# Patient Record
Sex: Female | Born: 1970 | Race: Black or African American | Hispanic: No | Smoking: Former smoker
Health system: Southern US, Community
[De-identification: ages and names within clinical notes are randomized; demographics above are authoritative.]

## PROBLEM LIST (undated history)

## (undated) DIAGNOSIS — R0683 Snoring: Secondary | ICD-10-CM

## (undated) DIAGNOSIS — M1990 Unspecified osteoarthritis, unspecified site: Secondary | ICD-10-CM

## (undated) DIAGNOSIS — N809 Endometriosis, unspecified: Secondary | ICD-10-CM

## (undated) DIAGNOSIS — E785 Hyperlipidemia, unspecified: Secondary | ICD-10-CM

## (undated) DIAGNOSIS — R739 Hyperglycemia, unspecified: Secondary | ICD-10-CM

## (undated) DIAGNOSIS — I2510 Atherosclerotic heart disease of native coronary artery without angina pectoris: Secondary | ICD-10-CM

## (undated) DIAGNOSIS — Z5189 Encounter for other specified aftercare: Secondary | ICD-10-CM

## (undated) DIAGNOSIS — K219 Gastro-esophageal reflux disease without esophagitis: Secondary | ICD-10-CM

## (undated) DIAGNOSIS — Z8619 Personal history of other infectious and parasitic diseases: Secondary | ICD-10-CM

## (undated) DIAGNOSIS — B009 Herpesviral infection, unspecified: Secondary | ICD-10-CM

## (undated) DIAGNOSIS — G4700 Insomnia, unspecified: Secondary | ICD-10-CM

## (undated) DIAGNOSIS — F329 Major depressive disorder, single episode, unspecified: Secondary | ICD-10-CM

## (undated) DIAGNOSIS — I10 Essential (primary) hypertension: Secondary | ICD-10-CM

## (undated) DIAGNOSIS — J3503 Chronic tonsillitis and adenoiditis: Secondary | ICD-10-CM

## (undated) DIAGNOSIS — F32A Depression, unspecified: Secondary | ICD-10-CM

## (undated) DIAGNOSIS — E119 Type 2 diabetes mellitus without complications: Secondary | ICD-10-CM

## (undated) DIAGNOSIS — I255 Ischemic cardiomyopathy: Secondary | ICD-10-CM

## (undated) HISTORY — DX: Personal history of other infectious and parasitic diseases: Z86.19

## (undated) HISTORY — PX: ABDOMINAL HYSTERECTOMY: SHX81

## (undated) HISTORY — DX: Type 2 diabetes mellitus without complications: E11.9

## (undated) HISTORY — DX: Gastro-esophageal reflux disease without esophagitis: K21.9

## (undated) HISTORY — PX: BREAST CYST EXCISION: SHX579

## (undated) HISTORY — PX: ENDOMETRIAL BIOPSY: SHX622

## (undated) HISTORY — DX: Essential (primary) hypertension: I10

## (undated) HISTORY — PX: LAPAROSCOPIC ENDOMETRIOSIS FULGURATION: SUR769

## (undated) HISTORY — DX: Insomnia, unspecified: G47.00

## (undated) HISTORY — DX: Snoring: R06.83

## (undated) HISTORY — DX: Unspecified osteoarthritis, unspecified site: M19.90

## (undated) HISTORY — DX: Chronic tonsillitis and adenoiditis: J35.03

## (undated) HISTORY — PX: COMBINED ABDOMINOPLASTY AND LIPOSUCTION: SUR284

## (undated) HISTORY — DX: Hyperlipidemia, unspecified: E78.5

## (undated) HISTORY — DX: Herpesviral infection, unspecified: B00.9

## (undated) HISTORY — DX: Encounter for other specified aftercare: Z51.89

## (undated) HISTORY — DX: Depression, unspecified: F32.A

## (undated) HISTORY — DX: Endometriosis, unspecified: N80.9

## (undated) HISTORY — PX: ENDOMETRIAL ABLATION: SHX621

## (undated) HISTORY — PX: TUBAL LIGATION: SHX77

## (undated) HISTORY — PX: PARTIAL HYSTERECTOMY: SHX80

---
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
## 2018-01-07 DIAGNOSIS — E119 Type 2 diabetes mellitus without complications: Secondary | ICD-10-CM | POA: Diagnosis not present

## 2018-01-07 DIAGNOSIS — E782 Mixed hyperlipidemia: Secondary | ICD-10-CM | POA: Diagnosis not present

## 2018-01-07 DIAGNOSIS — L509 Urticaria, unspecified: Secondary | ICD-10-CM | POA: Diagnosis not present

## 2018-01-07 DIAGNOSIS — I10 Essential (primary) hypertension: Secondary | ICD-10-CM | POA: Diagnosis not present

## 2018-01-11 DIAGNOSIS — E559 Vitamin D deficiency, unspecified: Secondary | ICD-10-CM | POA: Diagnosis not present

## 2018-01-11 DIAGNOSIS — E782 Mixed hyperlipidemia: Secondary | ICD-10-CM | POA: Diagnosis not present

## 2018-01-11 DIAGNOSIS — E1149 Type 2 diabetes mellitus with other diabetic neurological complication: Secondary | ICD-10-CM | POA: Diagnosis not present

## 2018-01-11 DIAGNOSIS — E669 Obesity, unspecified: Secondary | ICD-10-CM | POA: Diagnosis not present

## 2018-03-04 DIAGNOSIS — Z1231 Encounter for screening mammogram for malignant neoplasm of breast: Secondary | ICD-10-CM | POA: Diagnosis not present

## 2018-03-20 DIAGNOSIS — Z885 Allergy status to narcotic agent status: Secondary | ICD-10-CM | POA: Diagnosis not present

## 2018-03-20 DIAGNOSIS — E785 Hyperlipidemia, unspecified: Secondary | ICD-10-CM | POA: Diagnosis not present

## 2018-03-20 DIAGNOSIS — N762 Acute vulvitis: Secondary | ICD-10-CM | POA: Diagnosis not present

## 2018-03-20 DIAGNOSIS — I10 Essential (primary) hypertension: Secondary | ICD-10-CM | POA: Diagnosis not present

## 2018-03-20 DIAGNOSIS — Z888 Allergy status to other drugs, medicaments and biological substances status: Secondary | ICD-10-CM | POA: Diagnosis not present

## 2018-03-20 DIAGNOSIS — E119 Type 2 diabetes mellitus without complications: Secondary | ICD-10-CM | POA: Diagnosis not present

## 2018-03-20 DIAGNOSIS — Z886 Allergy status to analgesic agent status: Secondary | ICD-10-CM | POA: Diagnosis not present

## 2018-03-20 DIAGNOSIS — Z87891 Personal history of nicotine dependence: Secondary | ICD-10-CM | POA: Diagnosis not present

## 2018-03-20 DIAGNOSIS — Z881 Allergy status to other antibiotic agents status: Secondary | ICD-10-CM | POA: Diagnosis not present

## 2018-03-20 DIAGNOSIS — Z880 Allergy status to penicillin: Secondary | ICD-10-CM | POA: Diagnosis not present

## 2018-03-20 DIAGNOSIS — L03315 Cellulitis of perineum: Secondary | ICD-10-CM | POA: Diagnosis not present

## 2018-06-11 DIAGNOSIS — E1149 Type 2 diabetes mellitus with other diabetic neurological complication: Secondary | ICD-10-CM | POA: Diagnosis not present

## 2018-06-11 DIAGNOSIS — Z7251 High risk heterosexual behavior: Secondary | ICD-10-CM | POA: Diagnosis not present

## 2018-06-11 DIAGNOSIS — I10 Essential (primary) hypertension: Secondary | ICD-10-CM | POA: Diagnosis not present

## 2018-06-11 DIAGNOSIS — N760 Acute vaginitis: Secondary | ICD-10-CM | POA: Diagnosis not present

## 2018-06-11 DIAGNOSIS — E782 Mixed hyperlipidemia: Secondary | ICD-10-CM | POA: Diagnosis not present

## 2018-06-11 DIAGNOSIS — Z202 Contact with and (suspected) exposure to infections with a predominantly sexual mode of transmission: Secondary | ICD-10-CM | POA: Diagnosis not present

## 2018-06-11 DIAGNOSIS — Z113 Encounter for screening for infections with a predominantly sexual mode of transmission: Secondary | ICD-10-CM | POA: Diagnosis not present

## 2018-06-11 DIAGNOSIS — E119 Type 2 diabetes mellitus without complications: Secondary | ICD-10-CM | POA: Diagnosis not present

## 2018-09-28 DIAGNOSIS — Z23 Encounter for immunization: Secondary | ICD-10-CM | POA: Diagnosis not present

## 2018-12-15 DIAGNOSIS — N390 Urinary tract infection, site not specified: Secondary | ICD-10-CM | POA: Diagnosis not present

## 2018-12-15 DIAGNOSIS — Z113 Encounter for screening for infections with a predominantly sexual mode of transmission: Secondary | ICD-10-CM | POA: Diagnosis not present

## 2019-06-21 ENCOUNTER — Telehealth: Payer: Self-pay | Admitting: Family Medicine

## 2019-06-21 NOTE — Telephone Encounter (Signed)
Wanting to establish care with Dr. Charlett Blake. Please advise.

## 2019-07-08 NOTE — Telephone Encounter (Signed)
Ok to set up new patient

## 2019-07-18 ENCOUNTER — Ambulatory Visit (INDEPENDENT_AMBULATORY_CARE_PROVIDER_SITE_OTHER): Payer: BC Managed Care – PPO | Admitting: Family Medicine

## 2019-07-18 ENCOUNTER — Telehealth: Payer: Self-pay | Admitting: *Deleted

## 2019-07-18 ENCOUNTER — Other Ambulatory Visit: Payer: Self-pay

## 2019-07-18 ENCOUNTER — Encounter: Payer: Self-pay | Admitting: Family Medicine

## 2019-07-18 DIAGNOSIS — E119 Type 2 diabetes mellitus without complications: Secondary | ICD-10-CM | POA: Diagnosis not present

## 2019-07-18 DIAGNOSIS — G4700 Insomnia, unspecified: Secondary | ICD-10-CM

## 2019-07-18 DIAGNOSIS — F32A Depression, unspecified: Secondary | ICD-10-CM | POA: Insufficient documentation

## 2019-07-18 DIAGNOSIS — E785 Hyperlipidemia, unspecified: Secondary | ICD-10-CM

## 2019-07-18 DIAGNOSIS — Z8619 Personal history of other infectious and parasitic diseases: Secondary | ICD-10-CM

## 2019-07-18 DIAGNOSIS — R0683 Snoring: Secondary | ICD-10-CM | POA: Diagnosis not present

## 2019-07-18 DIAGNOSIS — Z8719 Personal history of other diseases of the digestive system: Secondary | ICD-10-CM

## 2019-07-18 DIAGNOSIS — B009 Herpesviral infection, unspecified: Secondary | ICD-10-CM

## 2019-07-18 DIAGNOSIS — I10 Essential (primary) hypertension: Secondary | ICD-10-CM

## 2019-07-18 DIAGNOSIS — F329 Major depressive disorder, single episode, unspecified: Secondary | ICD-10-CM

## 2019-07-18 DIAGNOSIS — N809 Endometriosis, unspecified: Secondary | ICD-10-CM

## 2019-07-18 DIAGNOSIS — F418 Other specified anxiety disorders: Secondary | ICD-10-CM | POA: Insufficient documentation

## 2019-07-18 DIAGNOSIS — R739 Hyperglycemia, unspecified: Secondary | ICD-10-CM

## 2019-07-18 MED ORDER — VALACYCLOVIR HCL 500 MG PO TABS: 500.0000 mg | ORAL_TABLET | Freq: Two times a day (BID) | ORAL | 1 refills | Status: DC

## 2019-07-18 MED ORDER — LOSARTAN POTASSIUM 50 MG PO TABS: 50.0000 mg | ORAL_TABLET | Freq: Every day | ORAL | 1 refills | Status: DC

## 2019-07-18 MED ORDER — ATORVASTATIN CALCIUM 40 MG PO TABS: 40.0000 mg | ORAL_TABLET | Freq: Every day | ORAL | 1 refills | Status: DC

## 2019-07-18 NOTE — Telephone Encounter (Signed)
Patient needs lab appointment Thursday or Friday this week, if no appt can do next week per Dr. Charlett Blake

## 2019-07-24 ENCOUNTER — Encounter: Payer: Self-pay | Admitting: Family Medicine

## 2019-07-24 DIAGNOSIS — E785 Hyperlipidemia, unspecified: Secondary | ICD-10-CM | POA: Insufficient documentation

## 2019-07-24 DIAGNOSIS — E109 Type 1 diabetes mellitus without complications: Secondary | ICD-10-CM | POA: Insufficient documentation

## 2019-07-24 DIAGNOSIS — N809 Endometriosis, unspecified: Secondary | ICD-10-CM | POA: Insufficient documentation

## 2019-07-24 DIAGNOSIS — E119 Type 2 diabetes mellitus without complications: Secondary | ICD-10-CM | POA: Insufficient documentation

## 2019-07-24 DIAGNOSIS — B009 Herpesviral infection, unspecified: Secondary | ICD-10-CM | POA: Insufficient documentation

## 2019-07-24 DIAGNOSIS — G4700 Insomnia, unspecified: Secondary | ICD-10-CM | POA: Insufficient documentation

## 2019-07-24 DIAGNOSIS — R739 Hyperglycemia, unspecified: Secondary | ICD-10-CM

## 2019-07-24 HISTORY — DX: Hyperglycemia, unspecified: R73.9

## 2019-07-24 NOTE — Progress Notes (Addendum)
Virtual Visit via Video Note  I connected with Bonnie Dickson on 07/18/19 at  3:00 PM EDT by a video enabled telemedicine application and verified that I am speaking with the correct person using two identifiers.  Location: Patient: home Provider: office   I discussed the limitations of evaluation and management by telemedicine and the availability of in person appointments. The patient expressed understanding and agreed to proceed. Magdalene Molly, CMA was able to get the patient set up on video visit   Subjective:    Patient ID: Bonnie Dickson, female    DOB: 1971-08-15, 48 y.o.   MRN: [id number]  Chief Complaint  Patient presents with  . Establish Care  . Hypertension  . Hyperlipidemia  . hsv 2  . Diabetes    HPI Patient is in today for new patient appointment to establish care to help manage her chronic medical concerns including hypertension, hyperlipidemia, HSV and diabetes. No recent febrile illness or hospitalizations. Is working as a Marine scientist but otherwise Psychologist, sport and exercise. Denies CP/palp/SOB/HA/congestion/fevers/GI or GU c/o. Taking meds as prescribed. She is maintaining a heart healthy diet and stays active. Her HSV is well controlled on valtrex and she is requesting a refill. She is on Atorvastatin at 40 mg daily and is tolerating this course of treatment. Her diabetes is diet controlled and does not endorse polyuria or polydipsia. She is trying to maintain a diabetic diet. She reports her blood pressure has been well controlled and she has no concerning symptoms such as headache or chest pain.   Past Medical History:  Diagnosis Date  . Depression    situational  . Diabetes mellitus without complication ([place])    diet controlled  . Endometriosis   . History of chicken pox   . HSV-2 (herpes simplex virus 2) infection   . Hyperglycemia 07/24/2019  . Hyperlipidemia   . Hypertension   . Insomnia   . Snoring   . Tonsillitis and adenoiditis, chronic    childhood,  caused adult snoring      Family History  Problem Relation Age of Onset  . Hypertension Mother   . Diabetes Mother        prediabetes  . Hyperlipidemia Mother   . Diabetes Father   . Cataracts Father   . Eczema Brother   . Asthma Brother   . Diabetes Maternal Grandmother   . Hypertension Maternal Grandmother   . Diabetes Maternal Grandfather   . Hypertension Maternal Grandfather   . Hyperlipidemia Maternal Grandfather   . Heart failure Maternal Grandfather   . COPD Maternal Grandfather   . ODD Son   . ADD / ADHD Son     Social History   Socioeconomic History  . Marital status: Widowed    Spouse name: Not on file  . Number of children: Not on file  . Years of education: Not on file  . Highest education level: Not on file  Occupational History  . Not on file  Social Needs  . Financial resource strain: Not on file  . Food insecurity    Worry: Not on file    Inability: Not on file  . Transportation needs    Medical: Not on file    Non-medical: Not on file  Tobacco Use  . Smoking status: Current Every Day Smoker    Packs/day: 0.50    Years: 30.00    Pack years: 15.00    Types: Cigarettes  . Smokeless tobacco: Never Used  Substance and Sexual Activity  . Alcohol use:  Never    Frequency: Never  . Drug use: Never  . Sexual activity: Not Currently  Lifestyle  . Physical activity    Days per week: Not on file    Minutes per session: Not on file  . Stress: Not on file  Relationships  . Social Herbalist on phone: Not on file    Gets together: Not on file    Attends religious service: Not on file    Active member of club or organization: Not on file    Attends meetings of clubs or organizations: Not on file    Relationship status: Not on file  . Intimate partner violence    Fear of current or ex partner: Not on file    Emotionally abused: Not on file    Physically abused: Not on file    Forced sexual activity: Not on file  Other Topics Concern   . Not on file  Social History Narrative  . Not on file    Outpatient Medications Prior to Visit  Medication Sig Dispense Refill  . atorvastatin (LIPITOR) 40 MG tablet Take 40 mg by mouth daily.    Marland Kitchen losartan (COZAAR) 50 MG tablet Take 50 mg by mouth daily.    . valACYclovir (VALTREX) 500 MG tablet Take 500 mg by mouth 2 (two) times daily.     No facility-administered medications prior to visit.     Allergies  Allergen Reactions  . Ciprofloxacin Hcl Anaphylaxis  . Gentamicin Anaphylaxis  . Penicillins Anaphylaxis  . Ambien [Zolpidem Tartrate] Other (See Comments)    Sleep walking   . Asa [Aspirin] Other (See Comments)    Rectal bleeding   . Ivp Dye [Iodinated Diagnostic Agents] Other (See Comments)    Severe vomiting   . Lisinopril Cough  . Metformin And Related Other (See Comments)    Severe GI upset  . Nsaids Other (See Comments)    Rectal bleeding   . Codeine Rash  . Flagyl [Metronidazole] Rash  . Morphine And Related Rash    Review of Systems  Constitutional: Negative for chills, fever and malaise/fatigue.  HENT: Negative for congestion and hearing loss.   Eyes: Negative for discharge.  Respiratory: Negative for cough, sputum production and shortness of breath.   Cardiovascular: Negative for chest pain, palpitations and leg swelling.  Gastrointestinal: Negative for abdominal pain, blood in stool, constipation, diarrhea, heartburn, nausea and vomiting.  Genitourinary: Negative for dysuria, frequency, hematuria and urgency.  Musculoskeletal: Negative for back pain, falls and myalgias.  Skin: Negative for rash.  Neurological: Negative for dizziness, sensory change, loss of consciousness, weakness and headaches.  Endo/Heme/Allergies: Negative for environmental allergies. Does not bruise/bleed easily.  Psychiatric/Behavioral: Negative for depression and suicidal ideas. The patient is not nervous/anxious and does not have insomnia.        Objective:    Physical  Exam Constitutional:      General: She is not in acute distress.    Appearance: She is well-developed.  HENT:     Head: Normocephalic and atraumatic.  Eyes:     Conjunctiva/sclera: Conjunctivae normal.  Neck:     Musculoskeletal: Neck supple.     Thyroid: No thyromegaly.  Cardiovascular:     Rate and Rhythm: Normal rate and regular rhythm.     Heart sounds: Normal heart sounds. No murmur.  Pulmonary:     Effort: Pulmonary effort is normal. No respiratory distress.     Breath sounds: Normal breath sounds.  Abdominal:  General: Bowel sounds are normal. There is no distension.     Palpations: Abdomen is soft. There is no mass.     Tenderness: There is no abdominal tenderness.  Lymphadenopathy:     Cervical: No cervical adenopathy.  Skin:    General: Skin is warm and dry.  Neurological:     Mental Status: She is alert and oriented to person, place, and time.  Psychiatric:        Behavior: Behavior normal.     There were no vitals taken for this visit. Wt Readings from Last 3 Encounters:  No data found for Wt    Diabetic Foot Exam - Simple   No data filed     No results found for: WBC, HGB, HCT, PLT, GLUCOSE, CHOL, TRIG, HDL, LDLDIRECT, LDLCALC, ALT, AST, NA, K, CL, CREATININE, BUN, CO2, TSH, PSA, INR, GLUF, HGBA1C, MICROALBUR  No results found for: TSH No results found for: WBC, HGB, HCT, MCV, PLT No results found for: NA, K, CHLORIDE, CO2, GLUCOSE, BUN, CREATININE, BILITOT, ALKPHOS, AST, ALT, PROT, ALBUMIN, CALCIUM, ANIONGAP, EGFR, GFR No results found for: CHOL No results found for: HDL No results found for: LDLCALC No results found for: TRIG No results found for: CHOLHDL No results found for: HGBA1C     Assessment & Plan:   Problem List Items Addressed This Visit    Snoring   History of chicken pox   Depression   History of IBS   Diabetes mellitus without complication ([place]) - Primary    hgba1c acceptable, minimize simple carbs. Increase exercise as  tolerated.       Relevant Medications   atorvastatin (LIPITOR) 40 MG tablet   losartan (COZAAR) 50 MG tablet   Other Relevant Orders   CBC   TSH   Hemoglobin A1c   Hyperlipidemia    Encouraged heart healthy diet, increase exercise, avoid trans fats, consider a krill oil cap daily      Relevant Medications   atorvastatin (LIPITOR) 40 MG tablet   losartan (COZAAR) 50 MG tablet   Other Relevant Orders   CBC   TSH   Lipid panel   HSV-2 (herpes simplex virus 2) infection    Doing well on Valcyclovir      Relevant Medications   valACYclovir (VALTREX) 500 MG tablet   Insomnia    Encouraged good sleep hygiene such as dark, quiet room. No blue/green glowing lights such as computer screens in bedroom. No alcohol or stimulants in evening. Cut down on caffeine as able. Regular exercise is helpful but not just prior to bed time.       Endometriosis   RESOLVED: Hyperglycemia    hgba1c acceptable, minimize simple carbs. Increase exercise as tolerated.       Hypertension    Well controlledo n Losartan 50 mg daily. Encouraged to check vitals weekly and report any concerns. no changes to meds. Encouraged heart healthy diet such as the DASH diet and exercise as tolerated.       Relevant Medications   atorvastatin (LIPITOR) 40 MG tablet   losartan (COZAAR) 50 MG tablet   Other Relevant Orders   CBC   TSH      I have changed Evy Klausing's atorvastatin, losartan, and valACYclovir.  Meds ordered this encounter  Medications  . atorvastatin (LIPITOR) 40 MG tablet    Sig: Take 1 tablet (40 mg total) by mouth daily.    Dispense:  90 tablet    Refill:  1  . losartan (COZAAR)  50 MG tablet    Sig: Take 1 tablet (50 mg total) by mouth daily.    Dispense:  90 tablet    Refill:  1  . valACYclovir (VALTREX) 500 MG tablet    Sig: Take 1 tablet (500 mg total) by mouth 2 (two) times daily.    Dispense:  180 tablet    Refill:  1    I discussed the assessment and treatment plan with  the patient. The patient was provided an opportunity to ask questions and all were answered. The patient agreed with the plan and demonstrated an understanding of the instructions.   The patient was advised to call back or seek an in-person evaluation if the symptoms worsen or if the condition fails to improve as anticipated.  I provided 40 minutes of non-face-to-face time during this encounter.   Penni Homans, MD

## 2019-07-24 NOTE — Assessment & Plan Note (Signed)
Encouraged good sleep hygiene such as dark, quiet room. No blue/green glowing lights such as computer screens in bedroom. No alcohol or stimulants in evening. Cut down on caffeine as able. Regular exercise is helpful but not just prior to bed time.  

## 2019-07-24 NOTE — Assessment & Plan Note (Signed)
hgba1c acceptable, minimize simple carbs. Increase exercise as tolerated.  

## 2019-07-24 NOTE — Assessment & Plan Note (Signed)
Doing well on [place]

## 2019-07-24 NOTE — Assessment & Plan Note (Signed)
Encouraged heart healthy diet, increase exercise, avoid trans fats, consider a krill oil cap daily 

## 2019-07-25 DIAGNOSIS — I10 Essential (primary) hypertension: Secondary | ICD-10-CM | POA: Insufficient documentation

## 2019-07-25 NOTE — Assessment & Plan Note (Signed)
Well controlledo n Losartan 50 mg daily. Encouraged to check vitals weekly and report any concerns. no changes to meds. Encouraged heart healthy diet such as the DASH diet and exercise as tolerated.

## 2019-07-28 ENCOUNTER — Other Ambulatory Visit (INDEPENDENT_AMBULATORY_CARE_PROVIDER_SITE_OTHER): Payer: BC Managed Care – PPO

## 2019-07-28 ENCOUNTER — Other Ambulatory Visit: Payer: Self-pay

## 2019-07-28 DIAGNOSIS — I10 Essential (primary) hypertension: Secondary | ICD-10-CM | POA: Diagnosis not present

## 2019-07-28 DIAGNOSIS — E785 Hyperlipidemia, unspecified: Secondary | ICD-10-CM | POA: Diagnosis not present

## 2019-07-28 DIAGNOSIS — E119 Type 2 diabetes mellitus without complications: Secondary | ICD-10-CM

## 2019-07-28 LAB — CBC
HCT: 45 % (ref 36.0–46.0)
Hemoglobin: 15.1 g/dL — ABNORMAL HIGH (ref 12.0–15.0)
MCHC: 33.6 g/dL (ref 30.0–36.0)
MCV: 95.3 fl (ref 78.0–100.0)
Platelets: 315 10*3/uL (ref 150.0–400.0)
RBC: 4.72 Mil/uL (ref 3.87–5.11)
RDW: 13.3 % (ref 11.5–15.5)
WBC: 7 10*3/uL (ref 4.0–10.5)

## 2019-07-28 LAB — LIPID PANEL
Cholesterol: 132 mg/dL (ref 0–200)
HDL: 32.5 mg/dL — ABNORMAL LOW (ref >39.00)
LDL Cholesterol: 88 mg/dL (ref 0–99)
NonHDL: 99.16
Total CHOL/HDL Ratio: 4
Triglycerides: 58 mg/dL (ref 0.0–149.0)
VLDL: 11.6 mg/dL (ref 0.0–40.0)

## 2019-07-28 LAB — TSH: TSH: 1.17 u[IU]/mL (ref 0.35–4.50)

## 2019-07-28 LAB — HEMOGLOBIN A1C: Hgb A1c MFr Bld: 6.5 % (ref 4.6–6.5)

## 2019-08-23 ENCOUNTER — Ambulatory Visit (INDEPENDENT_AMBULATORY_CARE_PROVIDER_SITE_OTHER): Payer: BC Managed Care – PPO

## 2019-08-23 ENCOUNTER — Other Ambulatory Visit: Payer: Self-pay

## 2019-08-23 DIAGNOSIS — Z23 Encounter for immunization: Secondary | ICD-10-CM | POA: Diagnosis not present

## 2019-09-06 DIAGNOSIS — E119 Type 2 diabetes mellitus without complications: Secondary | ICD-10-CM | POA: Diagnosis not present

## 2019-10-17 ENCOUNTER — Other Ambulatory Visit: Payer: Self-pay

## 2019-10-18 ENCOUNTER — Ambulatory Visit (INDEPENDENT_AMBULATORY_CARE_PROVIDER_SITE_OTHER): Payer: BC Managed Care – PPO | Admitting: Family Medicine

## 2019-10-18 ENCOUNTER — Encounter: Payer: Self-pay | Admitting: Family Medicine

## 2019-10-18 VITALS — BP 142/86 | HR 81 | Temp 98.2°F | Resp 18 | Wt 188.3 lb

## 2019-10-18 DIAGNOSIS — I10 Essential (primary) hypertension: Secondary | ICD-10-CM

## 2019-10-18 DIAGNOSIS — E785 Hyperlipidemia, unspecified: Secondary | ICD-10-CM

## 2019-10-18 DIAGNOSIS — E119 Type 2 diabetes mellitus without complications: Secondary | ICD-10-CM | POA: Diagnosis not present

## 2019-10-18 DIAGNOSIS — Z1231 Encounter for screening mammogram for malignant neoplasm of breast: Secondary | ICD-10-CM

## 2019-10-18 DIAGNOSIS — K581 Irritable bowel syndrome with constipation: Secondary | ICD-10-CM

## 2019-10-18 DIAGNOSIS — Z0000 Encounter for general adult medical examination without abnormal findings: Secondary | ICD-10-CM

## 2019-10-18 DIAGNOSIS — B009 Herpesviral infection, unspecified: Secondary | ICD-10-CM

## 2019-10-18 DIAGNOSIS — Z8719 Personal history of other diseases of the digestive system: Secondary | ICD-10-CM

## 2019-10-18 NOTE — Assessment & Plan Note (Signed)
hgba1c acceptable, minimize simple carbs. Increase exercise as tolerated.  

## 2019-10-18 NOTE — Assessment & Plan Note (Signed)
Encouraged heart healthy diet, increase exercise, avoid trans fats, consider a krill oil cap daily 

## 2019-10-18 NOTE — Assessment & Plan Note (Addendum)
She has been seeing blood intermittently. Is referred to gastroenterology for consideration of colonoscopy. Encouraged increased hydration and fiber in diet. Daily probiotics. If bowels not moving can use MOM 2 tbls po in 4 oz of warm prune juice by mouth every 2-3 days. If no results then repeat in 4 hours with  Dulcolax suppository pr, may repeat again in 4 more hours as needed. Seek care if symptoms worsen. Consider daily Miralax and/or Dulcolax if symptoms persist.

## 2019-10-18 NOTE — Assessment & Plan Note (Signed)
Well controlled, no changes to meds. Encouraged heart healthy diet such as the DASH diet and exercise as tolerated.  °

## 2019-10-18 NOTE — Assessment & Plan Note (Signed)
Uses Valcyclovir

## 2019-10-19 ENCOUNTER — Encounter: Payer: Self-pay | Admitting: Physician Assistant

## 2019-10-19 ENCOUNTER — Encounter: Payer: Self-pay | Admitting: Family Medicine

## 2019-10-19 DIAGNOSIS — Z0000 Encounter for general adult medical examination without abnormal findings: Secondary | ICD-10-CM | POA: Insufficient documentation

## 2019-10-19 NOTE — Assessment & Plan Note (Signed)
Mgm ordered today 

## 2019-10-19 NOTE — Progress Notes (Signed)
Subjective:    Patient ID: Bonnie Dickson, female    DOB: August 09, 1971, 48 y.o.   MRN: [id number]  No chief complaint on file.   HPI Patient is in today for follow up on new patient appointment and chronic medical concerns including hypertension, hyperlipidemia, diabetes and more. 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 No recent febrile illness or hospitalizations. No polyuria or polydipsia. Has a history of constipation and does see blood off and on with BM, no nausea, abdominal pain or anorexia. Denies CP/palp/SOB/HA/congestion/fevers or GU c/o. Taking meds as prescribed  Past Medical History:  Diagnosis Date  . Depression    situational  . Diabetes mellitus without complication ([place])    diet controlled  . Endometriosis   . History of chicken pox   . HSV-2 (herpes simplex virus 2) infection   . Hyperglycemia 07/24/2019  . Hyperlipidemia   . Hypertension   . Insomnia   . Snoring   . Tonsillitis and adenoiditis, chronic    childhood, caused adult snoring    Past Surgical History:  Procedure Laterality Date  . COMBINED ABDOMINOPLASTY AND LIPOSUCTION    . ENDOMETRIAL ABLATION    . ENDOMETRIAL BIOPSY    . LAPAROSCOPIC ENDOMETRIOSIS FULGURATION    . PARTIAL HYSTERECTOMY     vaginal  . TUBAL LIGATION      Family History  Problem Relation Age of Onset  . Hypertension Mother   . Diabetes Mother        prediabetes  . Hyperlipidemia Mother   . Diabetes Father   . Cataracts Father   . Eczema Brother   . Asthma Brother   . Diabetes Maternal Grandmother   . Hypertension Maternal Grandmother   . Diabetes Maternal Grandfather   . Hypertension Maternal Grandfather   . Hyperlipidemia Maternal Grandfather   . Heart failure Maternal Grandfather   . COPD Maternal Grandfather   . ODD Son   . ADD / ADHD Son     Social History   Socioeconomic History  . Marital status: Widowed    Spouse name: Not on file  . Number of children: Not on file  . Years of  education: Not on file  . Highest education level: Not on file  Occupational History  . Not on file  Social Needs  . Financial resource strain: Not on file  . Food insecurity    Worry: Not on file    Inability: Not on file  . Transportation needs    Medical: Not on file    Non-medical: Not on file  Tobacco Use  . Smoking status: Current Every Day Smoker    Packs/day: 0.50    Years: 30.00    Pack years: 15.00    Types: Cigarettes  . Smokeless tobacco: Never Used  Substance and Sexual Activity  . Alcohol use: Never    Frequency: Never  . Drug use: Never  . Sexual activity: Not Currently  Lifestyle  . Physical activity    Days per week: Not on file    Minutes per session: Not on file  . Stress: Not on file  Relationships  . Social Herbalist on phone: Not on file    Gets together: Not on file    Attends religious service: Not on file    Active member of club or organization: Not on file    Attends meetings of clubs or organizations: Not on file    Relationship status: Not on file  .  Intimate partner violence    Fear of current or ex partner: Not on file    Emotionally abused: Not on file    Physically abused: Not on file    Forced sexual activity: Not on file  Other Topics Concern  . Not on file  Social History Narrative  . Not on file    Outpatient Medications Prior to Visit  Medication Sig Dispense Refill  . atorvastatin (LIPITOR) 40 MG tablet Take 1 tablet (40 mg total) by mouth daily. 90 tablet 1  . losartan (COZAAR) 50 MG tablet Take 1 tablet (50 mg total) by mouth daily. 90 tablet 1  . valACYclovir (VALTREX) 500 MG tablet Take 1 tablet (500 mg total) by mouth 2 (two) times daily. 180 tablet 1   No facility-administered medications prior to visit.     Allergies  Allergen Reactions  . Ciprofloxacin Hcl Anaphylaxis  . Gentamicin Anaphylaxis  . Penicillins Anaphylaxis  . Ambien [Zolpidem Tartrate] Other (See Comments)    Sleep walking   . Asa  [Aspirin] Other (See Comments)    Rectal bleeding   . Ivp Dye [Iodinated Diagnostic Agents] Other (See Comments)    Severe vomiting   . Lisinopril Cough  . Metformin And Related Other (See Comments)    Severe GI upset  . Nsaids Other (See Comments)    Rectal bleeding   . Codeine Rash  . Flagyl [Metronidazole] Rash  . Morphine And Related Rash    Review of Systems  Constitutional: Negative for fever and malaise/fatigue.  HENT: Negative for congestion.   Eyes: Negative for blurred vision.  Respiratory: Negative for shortness of breath.   Cardiovascular: Negative for chest pain, palpitations and leg swelling.  Gastrointestinal: Positive for blood in stool and constipation. Negative for abdominal pain, melena and nausea.  Genitourinary: Negative for dysuria and frequency.  Musculoskeletal: Negative for falls.  Skin: Negative for rash.  Neurological: Negative for dizziness, loss of consciousness and headaches.  Endo/Heme/Allergies: Negative for environmental allergies.  Psychiatric/Behavioral: Negative for depression. The patient is not nervous/anxious.        Objective:    Physical Exam Vitals signs and nursing note reviewed.  Constitutional:      General: She is not in acute distress.    Appearance: She is well-developed.  HENT:     Head: Normocephalic and atraumatic.     Nose: Nose normal.  Eyes:     General:        Right eye: No discharge.        Left eye: No discharge.  Neck:     Musculoskeletal: Normal range of motion and neck supple.  Cardiovascular:     Rate and Rhythm: Normal rate and regular rhythm.     Heart sounds: No murmur.  Pulmonary:     Effort: Pulmonary effort is normal.     Breath sounds: Normal breath sounds.  Abdominal:     General: Bowel sounds are normal.     Palpations: Abdomen is soft.     Tenderness: There is no abdominal tenderness.  Skin:    General: Skin is warm and dry.  Neurological:     Mental Status: She is alert and oriented  to person, place, and time.     BP (!) 142/86 (BP Location: Left Arm, Patient Position: Sitting, Cuff Size: Normal)   Pulse 81   Temp 98.2 F (36.8 C) (Temporal)   Resp 18   Wt 188 lb 4.8 oz (85.4 kg)   SpO2 98%  Wt  Readings from Last 3 Encounters:  10/18/19 188 lb 4.8 oz (85.4 kg)    Diabetic Foot Exam - Simple   No data filed     Lab Results  Component Value Date   WBC 7.0 07/28/2019   HGB 15.1 (H) 07/28/2019   HCT 45.0 07/28/2019   PLT 315.0 07/28/2019   CHOL 132 07/28/2019   TRIG 58.0 07/28/2019   HDL 32.50 (L) 07/28/2019   LDLCALC 88 07/28/2019   TSH 1.17 07/28/2019   HGBA1C 6.5 07/28/2019    Lab Results  Component Value Date   TSH 1.17 07/28/2019   Lab Results  Component Value Date   WBC 7.0 07/28/2019   HGB 15.1 (H) 07/28/2019   HCT 45.0 07/28/2019   MCV 95.3 07/28/2019   PLT 315.0 07/28/2019   No results found for: NA, K, CHLORIDE, CO2, GLUCOSE, BUN, CREATININE, BILITOT, ALKPHOS, AST, ALT, PROT, ALBUMIN, CALCIUM, ANIONGAP, EGFR, GFR Lab Results  Component Value Date   CHOL 132 07/28/2019   Lab Results  Component Value Date   HDL 32.50 (L) 07/28/2019   Lab Results  Component Value Date   LDLCALC 88 07/28/2019   Lab Results  Component Value Date   TRIG 58.0 07/28/2019   Lab Results  Component Value Date   CHOLHDL 4 07/28/2019   Lab Results  Component Value Date   HGBA1C 6.5 07/28/2019       Assessment & Plan:   Problem List Items Addressed This Visit    History of IBS    She has been seeing blood intermittently. Is referred to gastroenterology for consideration of colonoscopy. Encouraged increased hydration and fiber in diet. Daily probiotics. If bowels not moving can use MOM 2 tbls po in 4 oz of warm prune juice by mouth every 2-3 days. If no results then repeat in 4 hours with  Dulcolax suppository pr, may repeat again in 4 more hours as needed. Seek care if symptoms worsen. Consider daily Miralax and/or Dulcolax if symptoms persist.        Diabetes mellitus without complication ([facility])    [id number] acceptable, minimize simple carbs. Increase exercise as tolerated.      Hyperlipidemia    Encouraged heart healthy diet, increase exercise, avoid trans fats, consider a krill oil cap daily      HSV-2 (herpes simplex virus 2) infection    Uses Valcyclovir      Hypertension    Well controlled, no changes to meds. Encouraged heart healthy diet such as the DASH diet and exercise as tolerated.       Preventative health care    Mgm ordered today       Other Visit Diagnoses    Irritable bowel syndrome with constipation    -  Primary   Relevant Orders   Ambulatory referral to Gastroenterology   Encounter for screening mammogram for malignant neoplasm of breast       Relevant Orders   MAMMOGRAM TOMO 3-D ([facility])      I am having Bonnie Dickson maintain her atorvastatin, losartan, and valACYclovir.  No orders of the defined types were placed in this encounter.    Penni Homans, MD

## 2019-11-01 ENCOUNTER — Ambulatory Visit (HOSPITAL_BASED_OUTPATIENT_CLINIC_OR_DEPARTMENT_OTHER)
Admission: RE | Admit: 2019-11-01 | Discharge: 2019-11-01 | Disposition: A | Discharge status: Home / Self Care | Payer: BC Managed Care – PPO | Source: Ambulatory Visit | Attending: Family Medicine | Admitting: Family Medicine

## 2019-11-01 ENCOUNTER — Encounter (HOSPITAL_BASED_OUTPATIENT_CLINIC_OR_DEPARTMENT_OTHER): Payer: Self-pay

## 2019-11-01 ENCOUNTER — Other Ambulatory Visit: Payer: Self-pay

## 2019-11-01 DIAGNOSIS — Z1231 Encounter for screening mammogram for malignant neoplasm of breast: Secondary | ICD-10-CM | POA: Insufficient documentation

## 2019-11-04 ENCOUNTER — Other Ambulatory Visit: Payer: Self-pay

## 2019-11-04 ENCOUNTER — Ambulatory Visit (INDEPENDENT_AMBULATORY_CARE_PROVIDER_SITE_OTHER): Payer: BC Managed Care – PPO | Admitting: Physician Assistant

## 2019-11-04 ENCOUNTER — Encounter: Payer: Self-pay | Admitting: Physician Assistant

## 2019-11-04 VITALS — BP 142/88 | HR 70 | Temp 97.8°F | Ht 67.0 in | Wt 191.0 lb

## 2019-11-04 DIAGNOSIS — K921 Melena: Secondary | ICD-10-CM | POA: Diagnosis not present

## 2019-11-04 DIAGNOSIS — Z1159 Encounter for screening for other viral diseases: Secondary | ICD-10-CM | POA: Diagnosis not present

## 2019-11-04 DIAGNOSIS — K581 Irritable bowel syndrome with constipation: Secondary | ICD-10-CM | POA: Diagnosis not present

## 2019-11-04 MED ORDER — NA SULFATE-K SULFATE-MG SULF 17.5-3.13-1.6 GM/177ML PO SOLN: 1.0000 | Freq: Once | ORAL | 0 refills | Status: AC

## 2019-11-04 NOTE — Progress Notes (Signed)
Chief Complaint: IBS-C, hematochezia  HPI:    Bonnie Dickson is a 48 year old AA female with a past medical history as listed below, who was referred to me by Bradd Canary, MD for a complaint of IBS-C, hematochezia.      10/18/2019 saw PCP to establish care and described a history of IBS.  Describes seeing blood intermittently.  They also discussed constipation at that time.    Today, the patient presents to clinic and explains that she has a long history of GI problems.  Apparently when pregnant with her first child she had a large amount of GI bleeding which put her in the hospital for a period of time.  They did a colonoscopy then in the 90s and told her this was from NSAIDs.  She had a repeat colonoscopy in 2008 in [place] due to some continued rectal bleeding and was told it was from internal and external hemorrhoids.  Also reports upper GI studies in 20 10/2012.  Unaware of results.  Describes that she was diagnosed with IBS-C.  She would typically only have bowel movements 1 week out of the month, typically around time of her period.  About 2 years ago the patient underwent a significant weight loss and started eating "a lot better" and this helped her to have more normal bowel movements.  Patient tells me she is pretty regular now but will still have more frequent bowel movements 1 week out of the month, typically around when her period would have been, though she is now status post hysterectomy but still has her ovaries.  Explains while she is having more frequent stools will have an increase in rectal bleeding/hematochezia.  Tells me this is quite a "lot of bright red blood" in the toilet mixed with stool.  Tells me in general her IBS-C is controlled now with her diet and after her weight loss.  If she starts eating bad again then she will become constipated and occasionally uses MiraLAX.  Describes that she cannot be on any NSAIDs given history of bleeding from this in the past.   Social history positive for teaching nursing students.    Denies fever, chills, weight loss, abdominal pain, anorexia, nausea, vomiting, heartburn, reflux or symptoms that awaken her from sleep.  Past Medical History:  Diagnosis Date  . Depression    situational  . Diabetes mellitus without complication (HCC)    diet controlled  . Endometriosis   . History of chicken pox   . HSV-2 (herpes simplex virus 2) infection   . Hyperglycemia 07/24/2019  . Hyperlipidemia   . Hypertension   . Insomnia   . Snoring   . Tonsillitis and adenoiditis, chronic    childhood, caused adult snoring    Past Surgical History:  Procedure Laterality Date  . ABDOMINAL HYSTERECTOMY     ovaries left in place  . BREAST CYST EXCISION Right    35 years ago   . COMBINED ABDOMINOPLASTY AND LIPOSUCTION    . ENDOMETRIAL ABLATION    . ENDOMETRIAL BIOPSY    . LAPAROSCOPIC ENDOMETRIOSIS FULGURATION    . PARTIAL HYSTERECTOMY     vaginal  . TUBAL LIGATION      Current Outpatient Medications  Medication Sig Dispense Refill  . atorvastatin (LIPITOR) 40 MG tablet Take 1 tablet (40 mg total) by mouth daily. 90 tablet 1  . losartan (COZAAR) 50 MG tablet Take 1 tablet (50 mg total) by mouth daily. 90 tablet 1  . valACYclovir (VALTREX)  500 MG tablet Take 1 tablet (500 mg total) by mouth 2 (two) times daily. 180 tablet 1   No current facility-administered medications for this visit.     Allergies as of 11/04/2019 - Review Complete 11/04/2019  Allergen Reaction Noted  . Ciprofloxacin hcl Anaphylaxis 07/18/2019  . Gentamicin Anaphylaxis 07/18/2019  . Penicillins Anaphylaxis 07/18/2019  . Ambien [zolpidem tartrate] Other (See Comments) 07/18/2019  . Asa [aspirin] Other (See Comments) 07/18/2019  . Ivp dye [iodinated diagnostic agents] Other (See Comments) 07/18/2019  . Lisinopril Cough 07/18/2019  . Metformin and related Other (See Comments) 07/18/2019  . Nsaids Other (See Comments) 07/18/2019  . Codeine Rash  07/18/2019  . Flagyl [metronidazole] Rash 07/18/2019  . Morphine and related Rash 07/18/2019    Family History  Problem Relation Age of Onset  . Hypertension Mother   . Diabetes Mother        prediabetes  . Hyperlipidemia Mother   . Diabetes Father   . Cataracts Father   . Eczema Brother   . Asthma Brother   . Diabetes Maternal Grandmother   . Hypertension Maternal Grandmother   . Diabetes Maternal Grandfather   . Hypertension Maternal Grandfather   . Hyperlipidemia Maternal Grandfather   . Heart failure Maternal Grandfather   . COPD Maternal Grandfather   . ODD Son   . ADD / ADHD Son   . Colon cancer Neg Hx   . Stomach cancer Neg Hx   . Pancreatic cancer Neg Hx   . Esophageal cancer Neg Hx     Social History   Socioeconomic History  . Marital status: Widowed    Spouse name: Not on file  . Number of children: Not on file  . Years of education: Not on file  . Highest education level: Not on file  Occupational History  . Not on file  Social Needs  . Financial resource strain: Not on file  . Food insecurity    Worry: Not on file    Inability: Not on file  . Transportation needs    Medical: Not on file    Non-medical: Not on file  Tobacco Use  . Smoking status: Current Every Day Smoker    Packs/day: 0.50    Years: 30.00    Pack years: 15.00    Types: Cigarettes  . Smokeless tobacco: Never Used  Substance and Sexual Activity  . Alcohol use: Never    Frequency: Never  . Drug use: Never  . Sexual activity: Not Currently  Lifestyle  . Physical activity    Days per week: Not on file    Minutes per session: Not on file  . Stress: Not on file  Relationships  . Social Herbalist on phone: Not on file    Gets together: Not on file    Attends religious service: Not on file    Active member of club or organization: Not on file    Attends meetings of clubs or organizations: Not on file    Relationship status: Not on file  . Intimate partner violence     Fear of current or ex partner: Not on file    Emotionally abused: Not on file    Physically abused: Not on file    Forced sexual activity: Not on file  Other Topics Concern  . Not on file  Social History Narrative  . Not on file    Review of Systems:    Constitutional: No weight loss, fever or chills Skin:  No rash Cardiovascular: No chest pain Respiratory: No SOB  Gastrointestinal: See HPI and otherwise negative Genitourinary: No dysuria  Neurological: No headache, dizziness or syncope Musculoskeletal: No new muscle or joint pain Hematologic: No bruising Psychiatric: No history of depression or anxiety   Physical Exam:  Vital signs: BP (!) 142/88   Pulse 70   Temp 97.8 F (36.6 C)   Ht 5\' 7"  (1.702 m)   Wt 191 lb (86.6 kg)   BMI 29.91 kg/m   Constitutional:   Pleasant AA female appears to be in NAD, Well developed, Well nourished, alert and cooperative Head:  Normocephalic and atraumatic. Eyes:   PEERL, EOMI. No icterus. Conjunctiva pink. Ears:  Normal auditory acuity. Neck:  Supple Throat: Oral cavity and pharynx without inflammation, swelling or lesion.  Respiratory: Respirations even and unlabored. Lungs clear to auscultation bilaterally.   No wheezes, crackles, or rhonchi.  Cardiovascular: Normal S1, S2. No MRG. Regular rate and rhythm. No peripheral edema, cyanosis or pallor.  Gastrointestinal:  Soft, nondistended, nontender. No rebound or guarding. Normal bowel sounds. No appreciable masses or hepatomegaly. Rectal:  Not performed.  Msk:  Symmetrical without gross deformities. Without edema, no deformity or joint abnormality.  Neurologic:  Alert and  oriented x4;  grossly normal neurologically.  Skin:   Dry and intact without significant lesions or rashes. Psychiatric: Demonstrates good judgement and reason without abnormal affect or behaviors.  No recent labs or imaging.  Assessment: 1.  Hematochezia: 1 week out of the month around time of more frequent  stooling, history of prior colonoscopies the last in 2008 with only internal and external hemorrhoids; consider hemorrhoids versus colitis versus other 2.  IBS-C: Controlled with diet  Plan: 1.  Scheduled patient for a diagnostic colonoscopy with Dr. Christella Hartigan, as he is supervising this morning.  Did discuss risks, benefits, limitations and alternatives and patient agrees to proceed.  Patient will be Covid tested 2 days prior to exam. 2.  Patient to continue her current diet as this is helping with her IBS-C. 3.  Patient to follow in clinic per recommendations from Dr. Christella Hartigan after time of procedure.  Hyacinth Meeker, PA-C [facility] 11/04/2019, 10:56 AM  Cc: Bradd Canary, MD

## 2019-11-04 NOTE — Progress Notes (Signed)
I agree with the above note, plan 

## 2019-11-04 NOTE — Patient Instructions (Signed)
If you are age 47 or older, your body mass index should be between 23-30. Your Body mass index is 29.91 kg/m. If this is out of the aforementioned range listed, please consider follow up with your Primary Care Provider.  If you are age 68 or younger, your body mass index should be between 19-25. Your Body mass index is 29.91 kg/m. If this is out of the aformentioned range listed, please consider follow up with your Primary Care Provider.   You have been scheduled for a colonoscopy. Please follow written instructions given to you at your visit today.  Please pick up your prep supplies at the pharmacy within the next 1-3 days. If you use inhalers (even only as needed), please bring them with you on the day of your procedure.  Thank you for choosing me and [facility]

## 2019-11-08 ENCOUNTER — Other Ambulatory Visit: Payer: Self-pay | Admitting: Family Medicine

## 2019-11-08 DIAGNOSIS — R928 Other abnormal and inconclusive findings on diagnostic imaging of breast: Secondary | ICD-10-CM

## 2019-11-09 ENCOUNTER — Ambulatory Visit: Payer: BC Managed Care – PPO

## 2019-11-09 ENCOUNTER — Ambulatory Visit
Admission: RE | Admit: 2019-11-09 | Discharge: 2019-11-09 | Disposition: A | Discharge status: Home / Self Care | Payer: BC Managed Care – PPO | Source: Ambulatory Visit | Attending: Family Medicine | Admitting: Family Medicine

## 2019-11-09 ENCOUNTER — Other Ambulatory Visit: Payer: Self-pay

## 2019-11-09 DIAGNOSIS — R928 Other abnormal and inconclusive findings on diagnostic imaging of breast: Secondary | ICD-10-CM

## 2019-11-11 DIAGNOSIS — Z7189 Other specified counseling: Secondary | ICD-10-CM | POA: Diagnosis not present

## 2019-11-11 DIAGNOSIS — Z20828 Contact with and (suspected) exposure to other viral communicable diseases: Secondary | ICD-10-CM | POA: Diagnosis not present

## 2019-11-21 ENCOUNTER — Telehealth: Payer: Self-pay | Admitting: Gastroenterology

## 2019-11-21 NOTE — Telephone Encounter (Signed)
Hi Dr. Ardis Hughs, this pt just cancelled her colonoscopy that was scheduled for this coming Wednesday 12/23 with you because her care partner had an unexpected conflict so will not be able to accompany pt. She will call back to reschedule. Thank you.

## 2019-11-23 ENCOUNTER — Encounter: Payer: BC Managed Care – PPO | Admitting: Gastroenterology

## 2020-03-16 ENCOUNTER — Other Ambulatory Visit: Payer: Self-pay | Admitting: Family Medicine

## 2020-04-13 ENCOUNTER — Telehealth: Payer: BC Managed Care – PPO | Admitting: Family Medicine

## 2020-04-26 ENCOUNTER — Encounter: Payer: Self-pay | Admitting: Family Medicine

## 2020-04-26 ENCOUNTER — Other Ambulatory Visit: Payer: Self-pay

## 2020-04-26 ENCOUNTER — Telehealth (INDEPENDENT_AMBULATORY_CARE_PROVIDER_SITE_OTHER): Payer: BC Managed Care – PPO | Admitting: Family Medicine

## 2020-04-26 DIAGNOSIS — I10 Essential (primary) hypertension: Secondary | ICD-10-CM | POA: Diagnosis not present

## 2020-04-26 DIAGNOSIS — E785 Hyperlipidemia, unspecified: Secondary | ICD-10-CM

## 2020-04-26 DIAGNOSIS — E119 Type 2 diabetes mellitus without complications: Secondary | ICD-10-CM

## 2020-04-26 DIAGNOSIS — F329 Major depressive disorder, single episode, unspecified: Secondary | ICD-10-CM

## 2020-04-26 DIAGNOSIS — F32A Depression, unspecified: Secondary | ICD-10-CM

## 2020-04-26 NOTE — Progress Notes (Signed)
Virtual Visit via phone Note  I connected with Bonnie Dickson on 04/26/20 at  2:20 PM EDT by a phone enabled telemedicine application and verified that I am speaking with the correct person using two identifiers.  Location: Patient: home, patient is in visit Provider: office, provider is in visit   I discussed the limitations of evaluation and management by telemedicine and the availability of in person appointments. The patient expressed understanding and agreed to proceed. Kem Boroughs, CMA was able to set the patient up with phone visit after being unable to set up video visit     Subjective:    Patient ID: Bonnie Dickson, female    DOB: [birth year], 49 y.o.   MRN: [id number]  Chief Complaint  Patient presents with  . 6 month follow up    HPI Patient is in today for follow up on chronic medical concerns. No recent febrile illness or hospitalizations. She has remained active uring the pandemic and has managed her stress well. No recent acute concerns. Denies CP/palp/SOB/HA/congestion/fevers/GI or GU c/o. Taking meds as prescribed  Past Medical History:  Diagnosis Date  . Depression    situational  . Diabetes mellitus without complication ([place])    diet controlled  . Endometriosis   . History of chicken pox   . HSV-2 (herpes simplex virus 2) infection   . Hyperglycemia 07/24/2019  . Hyperlipidemia   . Hypertension   . Insomnia   . Snoring   . Tonsillitis and adenoiditis, chronic    childhood, caused adult snoring    Past Surgical History:  Procedure Laterality Date  . ABDOMINAL HYSTERECTOMY     ovaries left in place  . BREAST CYST EXCISION Right    35 years ago   . COMBINED ABDOMINOPLASTY AND LIPOSUCTION    . ENDOMETRIAL ABLATION    . ENDOMETRIAL BIOPSY    . LAPAROSCOPIC ENDOMETRIOSIS FULGURATION    . PARTIAL HYSTERECTOMY     vaginal  . TUBAL LIGATION      Family History  Problem Relation Age of Onset  . Hypertension Mother   . Diabetes Mother     prediabetes  . Hyperlipidemia Mother   . Diabetes Father   . Cataracts Father   . Eczema Brother   . Asthma Brother   . Diabetes Maternal Grandmother   . Hypertension Maternal Grandmother   . Diabetes Maternal Grandfather   . Hypertension Maternal Grandfather   . Hyperlipidemia Maternal Grandfather   . Heart failure Maternal Grandfather   . COPD Maternal Grandfather   . ODD Son   . ADD / ADHD Son   . Colon cancer Neg Hx   . Stomach cancer Neg Hx   . Pancreatic cancer Neg Hx   . Esophageal cancer Neg Hx     Social History   Socioeconomic History  . Marital status: Widowed    Spouse name: Not on file  . Number of children: Not on file  . Years of education: Not on file  . Highest education level: Not on file  Occupational History  . Not on file  Tobacco Use  . Smoking status: Current Every Day Smoker    Packs/day: 0.50    Years: 30.00    Pack years: 15.00    Types: Cigarettes  . Smokeless tobacco: Never Used  Substance and Sexual Activity  . Alcohol use: Never  . Drug use: Never  . Sexual activity: Not Currently  Other Topics Concern  . Not on file  Social History Narrative  .  Not on file   Social Determinants of Health   Financial Resource Strain:   . Difficulty of Paying Living Expenses:   Food Insecurity:   . Worried About Charity fundraiser in the Last Year:   . Arboriculturist in the Last Year:   Transportation Needs:   . Film/video editor (Medical):   Marland Kitchen Lack of Transportation (Non-Medical):   Physical Activity:   . Days of Exercise per Week:   . Minutes of Exercise per Session:   Stress:   . Feeling of Stress :   Social Connections:   . Frequency of Communication with Friends and Family:   . Frequency of Social Gatherings with Friends and Family:   . Attends Religious Services:   . Active Member of Clubs or Organizations:   . Attends Archivist Meetings:   Marland Kitchen Marital Status:   Intimate Partner Violence:   . Fear of Current or  Ex-Partner:   . Emotionally Abused:   Marland Kitchen Physically Abused:   . Sexually Abused:     Outpatient Medications Prior to Visit  Medication Sig Dispense Refill  . atorvastatin (LIPITOR) 40 MG tablet Take 1 tablet by mouth once daily 90 tablet 0  . losartan (COZAAR) 50 MG tablet Take 1 tablet by mouth once daily 90 tablet 0  . valACYclovir (VALTREX) 500 MG tablet Take 1 tablet (500 mg total) by mouth 2 (two) times daily. 180 tablet 1   No facility-administered medications prior to visit.    Allergies  Allergen Reactions  . Ciprofloxacin Hcl Anaphylaxis  . Gentamicin Anaphylaxis  . Penicillins Anaphylaxis  . Ambien [Zolpidem Tartrate] Other (See Comments)    Sleep walking   . Asa [Aspirin] Other (See Comments)    Rectal bleeding   . Ivp Dye [Iodinated Diagnostic Agents] Other (See Comments)    Severe vomiting   . Lisinopril Cough  . Metformin And Related Other (See Comments)    Severe GI upset  . Nsaids Other (See Comments)    Rectal bleeding   . Codeine Rash  . Flagyl [Metronidazole] Rash  . Morphine And Related Rash    Review of Systems  Constitutional: Negative for fever and malaise/fatigue.  HENT: Negative for congestion.   Eyes: Negative for blurred vision.  Respiratory: Negative for shortness of breath.   Cardiovascular: Negative for chest pain, palpitations and leg swelling.  Gastrointestinal: Negative for abdominal pain, blood in stool and nausea.  Genitourinary: Negative for dysuria and frequency.  Musculoskeletal: Negative for falls.  Skin: Negative for rash.  Neurological: Negative for dizziness, loss of consciousness and headaches.  Endo/Heme/Allergies: Negative for environmental allergies.  Psychiatric/Behavioral: Negative for depression. The patient is not nervous/anxious.        Objective:    Physical Exam Vitals and nursing note reviewed.  Constitutional:      General: She is not in acute distress.    Appearance: She is well-developed.  HENT:      Head: Normocephalic and atraumatic.     Nose: Nose normal.  Eyes:     General:        Right eye: No discharge.        Left eye: No discharge.  Cardiovascular:     Rate and Rhythm: Normal rate and regular rhythm.     Heart sounds: No murmur.  Pulmonary:     Effort: Pulmonary effort is normal.     Breath sounds: Normal breath sounds.  Abdominal:     General: Bowel sounds  are normal.     Palpations: Abdomen is soft.     Tenderness: There is no abdominal tenderness.  Musculoskeletal:     Cervical back: Normal range of motion and neck supple.  Skin:    General: Skin is warm and dry.  Neurological:     Mental Status: She is alert and oriented to person, place, and time.     There were no vitals taken for this visit. Wt Readings from Last 3 Encounters:  11/04/19 191 lb (86.6 kg)  10/18/19 188 lb 4.8 oz (85.4 kg)    Diabetic Foot Exam - Simple   No data filed     Lab Results  Component Value Date   WBC 7.0 07/28/2019   HGB 15.1 (H) 07/28/2019   HCT 45.0 07/28/2019   PLT 315.0 07/28/2019   CHOL 132 07/28/2019   TRIG 58.0 07/28/2019   HDL 32.50 (L) 07/28/2019   LDLCALC 88 07/28/2019   TSH 1.17 07/28/2019   HGBA1C 6.5 07/28/2019    Lab Results  Component Value Date   TSH 1.17 07/28/2019   Lab Results  Component Value Date   WBC 7.0 07/28/2019   HGB 15.1 (H) 07/28/2019   HCT 45.0 07/28/2019   MCV 95.3 07/28/2019   PLT 315.0 07/28/2019   No results found for: NA, K, CHLORIDE, CO2, GLUCOSE, BUN, CREATININE, BILITOT, ALKPHOS, AST, ALT, PROT, ALBUMIN, CALCIUM, ANIONGAP, EGFR, GFR Lab Results  Component Value Date   CHOL 132 07/28/2019   Lab Results  Component Value Date   HDL 32.50 (L) 07/28/2019   Lab Results  Component Value Date   LDLCALC 88 07/28/2019   Lab Results  Component Value Date   TRIG 58.0 07/28/2019   Lab Results  Component Value Date   CHOLHDL 4 07/28/2019   Lab Results  Component Value Date   HGBA1C 6.5 07/28/2019         Assessment & Plan:   Problem List Items Addressed This Visit    Depression    She is doing well despite the pandemic. No changes at this time      Diabetes mellitus without complication (HCC)    [id number] acceptable, minimize simple carbs. Increase exercise as tolerated. Continue current meds      Hyperlipidemia    Tolerating statin, encouraged heart healthy diet, avoid trans fats, minimize simple carbs and saturated fats. Increase exercise as tolerated      Hypertension    Monitor and report any concerns. no changes to meds. Encouraged heart healthy diet such as the DASH diet and exercise as tolerated.          I am having Bonnie Dickson maintain her valACYclovir, losartan, and atorvastatin.  No orders of the defined types were placed in this encounter.  Increase leafy greens, consider increased lean red meat and using cast iron cookware. Continue to monitor, report any concerns  I discussed the assessment and treatment plan with the patient. The patient was provided an opportunity to ask questions and all were answered. The patient agreed with the plan and demonstrated an understanding of the instructions.   The patient was advised to call back or seek an in-person evaluation if the symptoms worsen or if the condition fails to improve as anticipated.  I provided 20 minutes of non-face-to-face time during this encounter.   Penni Homans, MD

## 2020-04-26 NOTE — Assessment & Plan Note (Signed)
hgba1c acceptable, minimize simple carbs. Increase exercise as tolerated. Continue current meds 

## 2020-04-26 NOTE — Assessment & Plan Note (Signed)
Tolerating statin, encouraged heart healthy diet, avoid trans fats, minimize simple carbs and saturated fats. Increase exercise as tolerated 

## 2020-04-30 NOTE — Assessment & Plan Note (Signed)
She is doing well despite the pandemic. No changes at this time

## 2020-04-30 NOTE — Assessment & Plan Note (Signed)
Monitor and report any concerns. no changes to meds. Encouraged heart healthy diet such as the DASH diet and exercise as tolerated.  

## 2020-07-14 ENCOUNTER — Other Ambulatory Visit: Payer: Self-pay | Admitting: Family Medicine

## 2020-07-31 DIAGNOSIS — Z03818 Encounter for observation for suspected exposure to other biological agents ruled out: Secondary | ICD-10-CM | POA: Diagnosis not present

## 2020-09-27 ENCOUNTER — Other Ambulatory Visit: Payer: Self-pay | Admitting: Family Medicine

## 2020-10-18 ENCOUNTER — Ambulatory Visit
Admission: EM | Admit: 2020-10-18 | Discharge: 2020-10-18 | Disposition: A | Discharge status: Home / Self Care | Payer: BC Managed Care – PPO | Attending: Family Medicine | Admitting: Family Medicine

## 2020-10-18 ENCOUNTER — Other Ambulatory Visit: Payer: Self-pay

## 2020-10-18 DIAGNOSIS — N898 Other specified noninflammatory disorders of vagina: Secondary | ICD-10-CM | POA: Diagnosis not present

## 2020-10-18 DIAGNOSIS — Z880 Allergy status to penicillin: Secondary | ICD-10-CM | POA: Diagnosis not present

## 2020-10-18 DIAGNOSIS — Z8619 Personal history of other infectious and parasitic diseases: Secondary | ICD-10-CM | POA: Insufficient documentation

## 2020-10-18 DIAGNOSIS — Z886 Allergy status to analgesic agent status: Secondary | ICD-10-CM | POA: Diagnosis not present

## 2020-10-18 DIAGNOSIS — Z888 Allergy status to other drugs, medicaments and biological substances status: Secondary | ICD-10-CM | POA: Insufficient documentation

## 2020-10-18 DIAGNOSIS — Z79899 Other long term (current) drug therapy: Secondary | ICD-10-CM | POA: Insufficient documentation

## 2020-10-18 DIAGNOSIS — Z881 Allergy status to other antibiotic agents status: Secondary | ICD-10-CM | POA: Insufficient documentation

## 2020-10-18 DIAGNOSIS — F17210 Nicotine dependence, cigarettes, uncomplicated: Secondary | ICD-10-CM | POA: Diagnosis not present

## 2020-10-18 DIAGNOSIS — Z885 Allergy status to narcotic agent status: Secondary | ICD-10-CM | POA: Insufficient documentation

## 2020-10-18 NOTE — ED Triage Notes (Signed)
Pt c/o vaginal itchy with white discharge since last night. States has had un protective intercourse.

## 2020-10-18 NOTE — Discharge Instructions (Signed)
We will call with any positive results and send medicine in  Please follow up if your symptoms fail to improve.

## 2020-10-18 NOTE — ED Provider Notes (Signed)
[facility]    CSN: [id number] Arrival date & time: 10/18/20  1923      History   Chief Complaint Chief Complaint  Patient presents with  . Vaginal Itching    HPI Shona Pardo is a 49 y.o. female.   She is presenting with few days history of vaginal discharge.  She has recently had unprotected intercourse.  Does have a history of hysterectomy.  Her blood sugars have been in a wider range than usual.  Denies any dysuria.  Does have a history of bacterial vaginosis.  HPI  Past Medical History:  Diagnosis Date  . Depression    situational  . Diabetes mellitus without complication (HCC)    diet controlled  . Endometriosis   . History of chicken pox   . HSV-2 (herpes simplex virus 2) infection   . Hyperglycemia 07/24/2019  . Hyperlipidemia   . Hypertension   . Insomnia   . Snoring   . Tonsillitis and adenoiditis, chronic    childhood, caused adult snoring    Patient Active Problem List   Diagnosis Date Noted  . Preventative health care 10/19/2019  . Hypertension 07/25/2019  . Diabetes mellitus without complication (HCC)   . Hyperlipidemia   . HSV-2 (herpes simplex virus 2) infection   . Insomnia   . Endometriosis   . History of IBS 07/18/2019  . Snoring   . History of chicken pox   . Depression     Past Surgical History:  Procedure Laterality Date  . ABDOMINAL HYSTERECTOMY     ovaries left in place  . BREAST CYST EXCISION Right    35 years ago   . COMBINED ABDOMINOPLASTY AND LIPOSUCTION    . ENDOMETRIAL ABLATION    . ENDOMETRIAL BIOPSY    . LAPAROSCOPIC ENDOMETRIOSIS FULGURATION    . PARTIAL HYSTERECTOMY     vaginal  . TUBAL LIGATION      OB History   No obstetric history on file.      Home Medications    Prior to Admission medications   Medication Sig Start Date End Date Taking? Authorizing Provider  atorvastatin (LIPITOR) 40 MG tablet Take 1 tablet by mouth once daily 07/16/20   Bradd Canary, MD  losartan (COZAAR) 50 MG  tablet Take 1 tablet by mouth once daily 07/16/20   Bradd Canary, MD  valACYclovir (VALTREX) 500 MG tablet Take 1 tablet by mouth twice daily 09/28/20   Bradd Canary, MD    Family History Family History  Problem Relation Age of Onset  . Hypertension Mother   . Diabetes Mother        prediabetes  . Hyperlipidemia Mother   . Diabetes Father   . Cataracts Father   . Eczema Brother   . Asthma Brother   . Diabetes Maternal Grandmother   . Hypertension Maternal Grandmother   . Diabetes Maternal Grandfather   . Hypertension Maternal Grandfather   . Hyperlipidemia Maternal Grandfather   . Heart failure Maternal Grandfather   . COPD Maternal Grandfather   . ODD Son   . ADD / ADHD Son   . Colon cancer Neg Hx   . Stomach cancer Neg Hx   . Pancreatic cancer Neg Hx   . Esophageal cancer Neg Hx     Social History Social History   Tobacco Use  . Smoking status: Current Every Day Smoker    Packs/day: 0.50    Years: 30.00    Pack years: 15.00  Types: Cigarettes  . Smokeless tobacco: Never Used  Vaping Use  . Vaping Use: Never used  Substance Use Topics  . Alcohol use: Never  . Drug use: Never     Allergies   Ciprofloxacin hcl, Gentamicin, Penicillins, Ambien [zolpidem tartrate], Asa [aspirin], Ivp dye [iodinated diagnostic agents], Lisinopril, Metformin and related, Nsaids, Codeine, Flagyl [metronidazole], and Morphine and related   Review of Systems Review of Systems  See HPI  Physical Exam Triage Vital Signs ED Triage Vitals [10/18/20 1931]  Enc Vitals Group     BP (!) 175/102     Pulse Rate 88     Resp 18     Temp 98.6 F (37 C)     Temp Source Oral     SpO2 94 %     Weight      Height      Head Circumference      Peak Flow      Pain Score 0     Pain Loc      Pain Edu?      Excl. in GC?    No data found.  Updated Vital Signs BP (!) 175/102 (BP Location: Left Arm)   Pulse 88   Temp 98.6 F (37 C) (Oral)   Resp 18   SpO2 94%   Visual  Acuity Right Eye Distance:   Left Eye Distance:   Bilateral Distance:    Right Eye Near:   Left Eye Near:    Bilateral Near:     Physical Exam Gen: NAD, alert, cooperative with exam, well-appearing ENT: normal lips, normal nasal mucosa,  Eye: normal EOM, normal conjunctiva and lids  Skin: no rashes, no areas of induration  Neuro: normal tone, normal sensation to touch Psych:  normal insight, alert and oriented    UC Treatments / Results  Labs (all labs ordered are listed, but only abnormal results are displayed) Labs Reviewed  CERVICOVAGINAL ANCILLARY ONLY    EKG   Radiology No results found.  Procedures Procedures (including critical care time)  Medications Ordered in UC Medications - No data to display  Initial Impression / Assessment and Plan / UC Course  I have reviewed the triage vital signs and the nursing notes.  Pertinent labs & imaging results that were available during my care of the patient were reviewed by me and considered in my medical decision making (see chart for details).     Ms. Weimann is a 49 year old female is presenting with vaginal discharge.  Concern it may be bacterial vaginosis but does have some uncontrolled blood sugars which could be yeast as well.  Swab was obtained.  Will call with any positive results and treat accordingly.  Counseled supportive care.  Given indications to follow-up.  Final Clinical Impressions(s) / UC Diagnoses   Final diagnoses:  Vaginal discharge     Discharge Instructions     We will call with any positive results and send medicine in  Please follow up if your symptoms fail to improve.     ED Prescriptions    None     PDMP not reviewed this encounter.   Myra Rude, MD 10/18/20 1949

## 2020-10-22 ENCOUNTER — Telehealth (HOSPITAL_COMMUNITY): Payer: Self-pay | Admitting: Emergency Medicine

## 2020-10-22 LAB — CERVICOVAGINAL ANCILLARY ONLY
Bacterial Vaginitis (gardnerella): POSITIVE — AB
Candida Glabrata: NEGATIVE
Candida Vaginitis: POSITIVE — AB
Chlamydia: NEGATIVE
Comment: NEGATIVE
Comment: NEGATIVE
Comment: NEGATIVE
Comment: NEGATIVE
Comment: NEGATIVE
Comment: NORMAL
Neisseria Gonorrhea: NEGATIVE
Trichomonas: POSITIVE — AB

## 2020-10-22 MED ORDER — FLUCONAZOLE 150 MG PO TABS: 150.0000 mg | ORAL_TABLET | Freq: Once | ORAL | 0 refills | Status: AC

## 2020-10-22 MED ORDER — CLINDAMYCIN PHOSPHATE 2 % VA CREA: 1.0000 | TOPICAL_CREAM | Freq: Every day | VAGINAL | 0 refills | Status: DC

## 2020-10-23 ENCOUNTER — Telehealth: Payer: Self-pay

## 2020-10-23 NOTE — Telephone Encounter (Signed)
Lets have her be seen in clinic.

## 2020-10-23 NOTE — Telephone Encounter (Signed)
Patient was seen at the [facility] in [place] by Dr Leonides Grills. He is referring the patient over for the following:   Per Renold Don, RN  Patient was diagnosed with trichomonas vaginalis infection.  Patient has an allergy to metronidazole.  She breaks out in generalized rash. Their office is requesting administration of metronidazole in the setting of severe allergy to metronidazole.  Treatment options for trichomonas vaginalis is limited to the nitroimidazole group of drugs  Please Advise if we can see this patient?  Thanks

## 2020-10-23 NOTE — Telephone Encounter (Signed)
Patient was diagnosed with trichomonas vaginalis infection.  Patient has an allergy to metronidazole.  She breaks out in generalized rash.  Patient will be referred to an allergist/immunologist for administration of metronidazole in the setting of severe allergy to metronidazole.  Treatment options for trichomonas vaginalis is limited to the nitroimidazole group of drugs.

## 2020-10-24 NOTE — Telephone Encounter (Signed)
Patient is scheduled for 11/30 @ 8:30 to see Dr Lucie Leather.

## 2020-10-24 NOTE — Telephone Encounter (Signed)
Dr. Lucie Leather would like patient to be seen in the office. Thank you!

## 2020-10-24 NOTE — Telephone Encounter (Signed)
Currently discussing with provider on where to fit the patient in as Dr Lucie Leather is booked out for new patient till January in [place] and the end of December for [facility]

## 2020-10-27 ENCOUNTER — Other Ambulatory Visit: Payer: Self-pay | Admitting: Family Medicine

## 2020-10-30 ENCOUNTER — Encounter: Payer: Self-pay | Admitting: Allergy and Immunology

## 2020-10-30 ENCOUNTER — Ambulatory Visit (INDEPENDENT_AMBULATORY_CARE_PROVIDER_SITE_OTHER): Payer: BC Managed Care – PPO | Admitting: Allergy and Immunology

## 2020-10-30 ENCOUNTER — Other Ambulatory Visit: Payer: Self-pay

## 2020-10-30 VITALS — BP 160/82 | HR 91 | Temp 98.1°F | Resp 18 | Ht 67.0 in | Wt 202.8 lb

## 2020-10-30 DIAGNOSIS — T50905D Adverse effect of unspecified drugs, medicaments and biological substances, subsequent encounter: Secondary | ICD-10-CM

## 2020-10-30 MED ORDER — TINIDAZOLE POWD: 0 refills | Status: DC

## 2020-10-30 MED ORDER — TINIDAZOLE 500 MG PO TABS: 500.0000 mg | ORAL_TABLET | Freq: Every day | ORAL | 0 refills | Status: AC

## 2020-10-30 NOTE — Progress Notes (Addendum)
[place] - [place] - [place] - [place] - [place]   Dear Dr. Abner Greenspan,  Thank you for referring Rickell Wiehe to the [facility] [place] on 10/30/2020.   Below is a summation of this patient's evaluation and recommendations.  Thank you for your referral. I will keep you informed about this patient's response to treatment.   If you have any questions please do not hesitate to contact me.   Sincerely,  Jessica Priest, MD Allergy / Immunology [place]   ______________________________________________________________________    NEW PATIENT NOTE  Referring Provider: Bradd Canary, MD Primary Provider: Bradd Canary, MD Date of office visit: 10/30/2020    Subjective:   Chief Complaint:  Bonnie Dickson (DOB: [birth year]) is a 49 y.o. female who presents to the clinic on 10/30/2020 with a chief complaint of Allergy Testing .     HPI: Loucinda presents to this clinic in evaluation of metronidazole adverse reaction.  Apparently in 2019 she was administered metronidazole at 500 mg dosage and on her first dosage she developed red blotchy itchy areas on her skin within 60 minutes of administration requiring the administration of Benadryl with resolution in a few hours.  She never had any scarring or hyperpigmentation with the skin lesions.  She never had any associated systemic or constitutional symptoms.  She has been diagnosed with trichomonas / bacterial vaginitis by the urgent care center on 18 October 2020 and is in need of being treated for this condition.  She has diet-controlled type 2 diabetes.  Past Medical History:  Diagnosis Date  . Depression    situational  . Diabetes mellitus without complication (HCC)    diet controlled  . Endometriosis   . History of chicken pox   . HSV-2 (herpes simplex virus 2) infection   . Hyperglycemia 07/24/2019  . Hyperlipidemia   .  Hypertension   . Insomnia   . Snoring   . Tonsillitis and adenoiditis, chronic    childhood, caused adult snoring    Past Surgical History:  Procedure Laterality Date  . ABDOMINAL HYSTERECTOMY     ovaries left in place  . BREAST CYST EXCISION Right    35 years ago   . COMBINED ABDOMINOPLASTY AND LIPOSUCTION    . ENDOMETRIAL ABLATION    . ENDOMETRIAL BIOPSY    . LAPAROSCOPIC ENDOMETRIOSIS FULGURATION    . PARTIAL HYSTERECTOMY     vaginal  . TUBAL LIGATION      Allergies as of 10/30/2020      Reactions   Ciprofloxacin Hcl Anaphylaxis   Gentamicin Anaphylaxis   Penicillins Anaphylaxis   Ambien [zolpidem Tartrate] Other (See Comments)   Sleep walking   Asa [aspirin] Other (See Comments)   Rectal bleeding   Ivp Dye [iodinated Diagnostic Agents] Other (See Comments)   Severe vomiting   Lisinopril Cough   Metformin And Related Other (See Comments)   Severe GI upset   Nsaids Other (See Comments)   Rectal bleeding   Codeine Rash   Flagyl [metronidazole] Rash   Morphine And Related Rash      Medication List    atorvastatin 40 MG tablet Commonly known as: LIPITOR Take 1 tablet by mouth once daily   losartan 50 MG tablet Commonly known as: COZAAR Take 1 tablet by mouth once daily   valACYclovir 500 MG tablet Commonly known as: VALTREX Take 1 tablet by mouth twice daily  Review of systems negative except as noted in HPI / PMHx or noted below:  Review of Systems  Constitutional: Negative.   HENT: Negative.   Eyes: Negative.   Respiratory: Negative.   Cardiovascular: Negative.   Gastrointestinal: Negative.   Genitourinary: Negative.   Musculoskeletal: Negative.   Skin: Negative.   Neurological: Negative.   Endo/Heme/Allergies: Negative.   Psychiatric/Behavioral: Negative.     Family History  Problem Relation Age of Onset  . Hypertension Mother   . Diabetes Mother        prediabetes  . Hyperlipidemia Mother   . Diabetes Father   . Cataracts  Father   . Eczema Brother   . Asthma Brother   . Diabetes Maternal Grandmother   . Hypertension Maternal Grandmother   . Diabetes Maternal Grandfather   . Hypertension Maternal Grandfather   . Hyperlipidemia Maternal Grandfather   . Heart failure Maternal Grandfather   . COPD Maternal Grandfather   . ODD Son   . ADD / ADHD Son   . Colon cancer Neg Hx   . Stomach cancer Neg Hx   . Pancreatic cancer Neg Hx   . Esophageal cancer Neg Hx     Social History   Socioeconomic History  . Marital status: Widowed    Spouse name: Not on file  . Number of children: Not on file  . Years of education: Not on file  . Highest education level: Not on file  Occupational History  . Not on file  Tobacco Use  . Smoking status: Current Every Day Smoker    Packs/day: 0.50    Years: 30.00    Pack years: 15.00    Types: Cigarettes  . Smokeless tobacco: Never Used  Vaping Use  . Vaping Use: Never used  Substance and Sexual Activity  . Alcohol use: Never  . Drug use: Never  . Sexual activity: Not Currently    Birth control/protection: Surgical  Other Topics Concern  . Not on file  Social History Narrative  . Not on file   Objective:   Vitals:   10/30/20 0833 10/30/20 0922  BP: (!) 182/110 (!) 160/82  Pulse: 91   Resp: 18   Temp: 98.1 F (36.7 C)   SpO2: 97%    Height: 5\' 7"  (170.2 cm) Weight: 202 lb 12.8 oz (92 kg)  Physical Exam Constitutional:      Appearance: She is not diaphoretic.  HENT:     Head: Normocephalic.     Right Ear: Tympanic membrane, ear canal and external ear normal.     Left Ear: Tympanic membrane, ear canal and external ear normal.     Nose: Nose normal. No mucosal edema or rhinorrhea.     Mouth/Throat:     Pharynx: Uvula midline. No oropharyngeal exudate.  Eyes:     Conjunctiva/sclera: Conjunctivae normal.  Neck:     Thyroid: No thyromegaly.     Trachea: Trachea normal. No tracheal tenderness or tracheal deviation.  Cardiovascular:     Rate and  Rhythm: Normal rate and regular rhythm.     Heart sounds: Normal heart sounds, S1 normal and S2 normal. No murmur heard.   Pulmonary:     Effort: No respiratory distress.     Breath sounds: Normal breath sounds. No stridor. No wheezing or rales.  Lymphadenopathy:     Head:     Right side of head: No tonsillar adenopathy.     Left side of head: No tonsillar adenopathy.     Cervical:  No cervical adenopathy.  Skin:    Findings: No erythema or rash.     Nails: There is no clubbing.  Neurological:     Mental Status: She is alert.     Diagnostics: Allergy skin tests were not performed.   Results of a cervicovaginal swab dated 18 October 2020 identifies positive trichomonas, positive Gardnerella, positive Candida.  Assessment and Plan:    1. Adverse effect of drug, subsequent encounter     1.  Start the following medications today:   A. Prednisone 10 mg - 2 tablets now, 2 tablets this evening  B. Cetirizine 10 mg - 1 tablet now, 1 tablet this evening  C. Famotidine 20 mg - 1 tablet now, 1 tablet this evening  2.  Start the following medications tomorrow:   A. Prednisone 10 mg - 2 tablets in AM and PM  B. Cetirizine 10 mg - 1 tablet in AM and PM  C. Famotidine 20 mg - 1 tablet in AM and PM  3. Return tomorrow AM to start Tinidazole 500 mg / 10 ml compound challenge:   A. 0.1 ml, wait 20 minutes, then  B. 0.5 ml, wait 20 minutes, then  C. 1.0 ml, wait 20 minutes, then  D. 4.0 ml, wait 20 minutes, then  E. 5.0 ml, wait 40 minutes  4. Continue the following medications for 5 days starting next day after challenge:   A. Tinidazole 500 mg tablet - 1 tablet 1 time per day x 4 days  B. Prednisone 10 mg - 1 tablet 1 time per day for 4 days  C. Cetirizine 10 mg - 1 tablet 2 times per day x 4 days  D. Famotidine 20 mg - 1 tablet 2 times per day for 4 days  4. Further treatment? Check blood sugars  We will have Lizmary undergo pretreatment for a adverse drug reaction and have  her undergo a incremental challenge/desensitization with Tinidazole to treat trichomonas infection as noted above. She does have type 2 diabetes and will need to monitor her blood sugars closely through this therapy.  Jessica Priest, MD Allergy / Immunology [facility] [place]

## 2020-10-30 NOTE — Patient Instructions (Addendum)
  1.  Start the following medications today:   A. Prednisone 10 mg - 2 tablets now, 2 tablets this evening  B. Cetirizine 10 mg - 1 tablet now, 1 tablet this evening  C. Famotidine 20 mg - 1 tablet now, 1 tablet this evening  2.  Start the following medications tomorrow:   A. Prednisone 10 mg - 2 tablets in AM and PM  B. Cetirizine 10 mg - 1 tablet in AM and PM  C. Famotidine 20 mg - 1 tablet in AM and PM  3. Return tomorrow AM to start Tinidazole 500 mg / 10 ml compound challenge:   A. 0.1 ml, wait 20 minutes, then  B. 0.5 ml, wait 20 minutes, then  C. 1.0 ml, wait 20 minutes, then  D. 4.0 ml, wait 20 minutes, then  E. 5.0 ml, wait 40 minutes  4. Continue the following medications for 5 days starting next day after challenge:   A. Tinidazole 500 mg tablet - 1 tablet 1 time per day x 4 days  B. Prednisone 10 mg - 1 tablet 1 time per day for 4 days  C. Cetirizine 10 mg - 1 tablet 2 times per day x 4 days  D. Famotidine 20 mg - 1 tablet 2 times per day for 4 days  4. Further treatment? Check blood sugars

## 2020-10-31 ENCOUNTER — Encounter: Payer: Self-pay | Admitting: Allergy and Immunology

## 2020-10-31 ENCOUNTER — Ambulatory Visit (INDEPENDENT_AMBULATORY_CARE_PROVIDER_SITE_OTHER): Payer: BC Managed Care – PPO | Admitting: Allergy and Immunology

## 2020-10-31 VITALS — BP 168/90 | HR 96 | Resp 18

## 2020-10-31 DIAGNOSIS — T50905D Adverse effect of unspecified drugs, medicaments and biological substances, subsequent encounter: Secondary | ICD-10-CM

## 2020-10-31 NOTE — Progress Notes (Signed)
Trecinida presents to this clinic today to have a tinidazole challenge/desensitization protocol completed with details of that protocol established during yesterday's evaluation.  During she successfully completed tinidazole administration without any adverse effect and will continue to utilize this medication with the written protocol provided her yesterday.

## 2020-10-31 NOTE — Patient Instructions (Addendum)
  1.  Start the following medications today:   A. Prednisone 10 mg - 2 tablets now, 2 tablets this evening  B. Cetirizine 10 mg - 1 tablet now, 1 tablet this evening  C. Famotidine 20 mg - 1 tablet now, 1 tablet this evening  2.  Start the following medications tomorrow:   A. Prednisone 10 mg - 2 tablets in AM and PM  B. Cetirizine 10 mg - 1 tablet in AM and PM  C. Famotidine 20 mg - 1 tablet in AM and PM  3. Return tomorrow AM to start Tinidazole 500 mg / 10 ml compound challenge:   A. 0.1 ml, wait 20 minutes, then  B. 0.5 ml, wait 20 minutes, then  C. 1.0 ml, wait 20 minutes, then  D. 4.0 ml, wait 20 minutes, then  E. 5.0 ml, wait 40 minutes  4. Continue the following medications for 5 days starting next day after challenge:   A. Tinidazole 500 mg tablet - 1 tablet 1 time per day x 4 days  B. Prednisone 10 mg - 1 tablet 1 time per day for 4 days  C. Cetirizine 10 mg - 1 tablet 2 times per day x 4 days  D. Famotidine 20 mg - 1 tablet 2 times per day for 4 days  4. Further treatment? Check blood sugars 

## 2020-11-01 ENCOUNTER — Encounter: Payer: Self-pay | Admitting: Allergy and Immunology

## 2020-11-02 ENCOUNTER — Encounter: Payer: Self-pay | Admitting: Family Medicine

## 2020-11-02 ENCOUNTER — Other Ambulatory Visit: Payer: Self-pay

## 2020-11-02 DIAGNOSIS — E119 Type 2 diabetes mellitus without complications: Secondary | ICD-10-CM

## 2020-11-02 MED ORDER — INSULIN ASPART 100 UNIT/ML ~~LOC~~ SOLN: 2.0000 [IU] | Freq: Three times a day (TID) | SUBCUTANEOUS | 99 refills | Status: DC

## 2020-11-06 ENCOUNTER — Telehealth: Payer: Self-pay

## 2020-11-06 ENCOUNTER — Other Ambulatory Visit: Payer: Self-pay

## 2020-11-06 DIAGNOSIS — E119 Type 2 diabetes mellitus without complications: Secondary | ICD-10-CM

## 2020-11-06 MED ORDER — INSULIN ASPART 100 UNIT/ML ~~LOC~~ SOLN: 2.0000 [IU] | Freq: Three times a day (TID) | SUBCUTANEOUS | 99 refills | Status: DC

## 2020-11-06 NOTE — Telephone Encounter (Signed)
OK change the Novolog to Humalog with the same sig and dispensing instructions.

## 2020-11-06 NOTE — Telephone Encounter (Signed)
Novolog not covered by insurance, preferred is Humalog or  Lyumjev. Please advise.

## 2020-11-07 MED ORDER — INSULIN LISPRO 100 UNIT/ML ~~LOC~~ SOLN
2.0000 [IU] | Freq: Three times a day (TID) | SUBCUTANEOUS | 99 refills | Status: DC
Start: 2020-11-07 — End: 2020-11-20

## 2020-11-07 NOTE — Telephone Encounter (Signed)
[facility]  Advanced Heart Failure and Transplant  Heart Transplant Clinic  Follow-up Visit    Primary Care Physician: Brodsky, Mark E  Referring Provider: Brett Justin Berman  Date of Transplant: 01/20/2020  Organ(s) Transplanted: heart  Indication for transplant: Dilated Myopathy: Idiopathic  PHS increased risk donor: Yes    ID. 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    Interval History:    The patient was last seen on 03/17/22. At that time issues with pain after urologic procedure.    He continues to deal with pain issue largely from prostate surgery. He still has some bleeding and some tissue come out. He tried different strategies and nothing helped. This is really impacting quality of life. He gets tired and frustrated and does not want to take it out on his family.    ROS:  A complete ROS was performed and is negative except as documented in the HPI.      Allergies:  Patient is allergic to cats [other] and dogs [other].    Past Medical History:   Diagnosis Date    Asthma     Atrial fibrillation (CMS-HCC)     Chronic HFrEF (heart failure with reduced ejection fraction) (CMS-HCC)     GERD (gastroesophageal reflux disease)     HTN (hypertension)     Insomnia     Nephrolithiasis     Sinusitis      Patient Active Problem List   Diagnosis    COPD (chronic obstructive pulmonary disease) (CMS-HCC)    Heart transplant, orthotopic, 01/20/2020    Pericardial effusion    Hypertension    Chronic back pain    At risk for infection transmitted from donor    Acute hepatitis C virus infection    Heart transplanted (CMS-HCC)    Acute UTI    Umbilical hernia without obstruction and without gangrene    COVID-19 virus detected    Acute medial meniscus tear of left knee, sequela    Localized osteoarthritis of left knee     Past Surgical History:   Procedure Laterality Date    CARDIAC DEFIBRILLATOR PLACEMENT       PB ANESTH,SHOULDER JOINT,NOS Right      Family History   Problem Relation Name Age of Onset    Hypertension Other      Other Maternal Grandmother          kidney disease needing HD     Social History     Socioeconomic History    Marital status: Single     Spouse name: Not on file    Number of children: Not on file    Years of education: Not on file    Highest education level: Not on file   Occupational History    Not on file   Tobacco Use    Smoking status: Never    Smokeless tobacco: Never    Tobacco comments:     from friends and relatives    Substance and Sexual Activity    Alcohol use: Not Currently     Comment: Prior heavier use, but completely quit in 2016    Drug use: Yes     Comment: eats edible marijuana for pain and insomnia     Sexual activity: Not on file   Other Topics Concern    Not on file   Social History Narrative      Born in [place], also lived in [place], [place], [place], no travel, worked as a carpenter, occasional cedar, no birds, no hot tubs, worked in construction + possible asbestos exposure      Social Determinants of Health     Financial Resource Strain: Not on file   Food Insecurity: Not on file   Transportation Needs: Not on file   Physical Activity: Not on file   Stress: Not on file   Social Connections: Not on file   Intimate Partner Violence: Not on file   Housing Stability: Not on file     Current Outpatient Medications   Medication Sig    albuterol 108 (90 Base) MCG/ACT inhaler Inhale 2 puffs by mouth every 4 hours as needed for Wheezing or Shortness of Breath.    aspirin 81 MG EC tablet Take 1 tablet (81 mg) by mouth daily.    baclofen (LIORESAL) 10 MG tablet Take 2 tablets (20 mg) by mouth nightly.    Blood Glucose Monitoring Suppl (TRUE METRIX METER) w/Device KIT Use as directed    budesonide-formoterol (SYMBICORT) 160-4.5 MCG/ACT inhaler Inhale 2 puffs by mouth every 12 hours.    bumetanide (BUMEX) 1 MG tablet Take 1 tablet (1 mg) by mouth daily as needed (fluid/weight  gain). Do not take unless instructed by Transplant team.    Calcium Carb-Cholecalciferol 600-10 MG-MCG TABS Take 1 tablet by mouth 2 times daily.    Cetirizine HCl (ZERVIATE) 0.24 % SOLN Place 1 drop into both eyes 2 times daily.    clindamycin (CLEOCIN T) 1 % solution Apply 1 Application. topically 2 times daily. Apply to the red bumps on your face up to two times a day.    controlled substance agreement controlled substance agreement    diclofenac (VOLTAREN) 1 % gel Apply 2 g topically 4 times daily.    docusate sodium (COLACE) 100 MG capsule Take 1 capsule (100 mg) by mouth 2 times daily.    DULoxetine (CYMBALTA) 30 MG CR capsule Take 1 capsule (30 mg) by mouth daily.    famotidine (PEPCID) 20 MG tablet Take 1 tablet (20 mg) by mouth 2 times daily.    fluticasone propionate (FLONASE) 50 MCG/ACT nasal spray Spray 1 spray into each nostril 2 times daily.    gabapentin (NEURONTIN) 300 MG capsule Take 1 capsule (300 mg) by mouth every morning AND 1 capsule (300 mg) daily AND 2 capsules (600 mg) every evening.    hydroCHLOROthiazide (HYDRODIURIL) 25 MG tablet Take 1 tablet (25 mg) by mouth daily.    ketoconazole (NIZORAL) 2 % shampoo Use shampoo daily for dandruff    lidocaine (LIDOCAINE PAIN RELIEF) 4 % patch Apply 1 patch topically every 24 hours. Leave patch on for 12 hours, then remove for 12 hours.    lisinopril (PRINIVIL, ZESTRIL) 10 MG tablet Take 2 tablets (20 mg) by mouth daily.    magnesium oxide (MAG-OX) 400 MG tablet Take 1 tablet by mouth daily    melatonin (GNP MELATONIN MAXIMUM STRENGTH) 5 MG tablet Take 2 tablets (10 mg) by mouth at bedtime.    Multiple Vitamin (MULTIVITAMIN) TABS tablet Take 1 tablet by mouth daily.    naloxone (KLOXXADO) 8 mg/0.1 mL nasal spray Call 911! Tilt head and spray intranasally into one nostril as needed for respiratory depression. If patient does not respond or responds and then relapses, repeat using a new nasal spray every 3 minutes until emergency medical assistance  arrives.    NEEDLE, DISP, 25 G 25G X   1" MISC Use to inject testosterone    NIFEdipine (ADALAT CC) 30 MG Controlled-Release tablet Take 1 tablet (30 mg) by mouth nightly.    ondansetron (ZOFRAN) 8 MG tablet Take 1 tablet (8 mg) by mouth every 8 hours as needed for Nausea/Vomiting.    oxyCODONE (ROXICODONE) 10 MG tablet Take 1 tab every 4 hours as needed for moderate pain and 2 tabs every 4 hours as needed for severe pain. Max 10 tabs per day, 28 day supply    phenazopyridine (PYRIDIUM) 100 MG tablet Take 1 tablet (100 mg) by mouth 3 times daily.    polyethylene glycol (GLYCOLAX) 17 GM/SCOOP powder Mix 17 grams in 4-8 oz of liquide and drink by mouth daily as needed (Constipation).    pravastatin (PRAVACHOL) 40 MG tablet Take 1 tablet (40 mg) by mouth every evening.    senna (SENOKOT) 8.6 MG tablet Take 1 tablet (8.6 mg) by mouth daily.    sirolimus (RAPAMUNE) 1 MG tablet Take 2 tablets (2 mg) by mouth every morning.    SYRINGE-NEEDLE, DISP, 3 ML (B-D 3CC LUER-LOK SYR 25GX1") 25G X 1" 3 ML MISC Use as directed to inject testosterone    SYRINGE-NEEDLE, DISP, 3 ML 18G X 1-1/2" 3 ML MISC Use to draw up testosterone    tacrolimus (ENVARSUS XR) 1 MG tablet STOP TAKING since 08/13/22 - remaining on chart for dose adjustments, titratable med.    tacrolimus (ENVARSUS XR) 4 MG tablet Take 1 tablet (4 mg) by mouth every morning.    tamsulosin (FLOMAX) 0.4 MG capsule Take 1 capsule (0.4 mg) by mouth daily.    tamsulosin (FLOMAX) 0.4 MG capsule Take 1 capsule (0.4 mg) by mouth daily.    testosterone cypionate (DEPO-TESTOSTERONE) 200 MG/ML SOLN Inject 1 ml into the muscle every 14 days    traZODone (DESYREL) 50 MG tablet Take 1 tablet (50 mg) by mouth nightly.     Current Facility-Administered Medications   Medication    diphenhydrAMINE (BENADRYL) injection 50 mg    diphenhydrAMINE (BENADRYL) tablet 50 mg     Immunization History   Administered Date(s) Administered    COVID-19 (Moderna) Low Dose Red Cap >= 18 Years 01/11/2021     COVID-19 (Moderna) Red Cap >= 12 Years 02/18/2020, 03/19/2020, 07/18/2020    Hep-A/Hep-B; Twinrix, Adult 02/11/2021    Influenza Vaccine (High Dose) Quadrivalent >=65 Years 10/03/2020    Influenza Vaccine (Unspecified) 08/01/2017    Influenza Vaccine >=6 Months 10/14/2010, 10/14/2011, 12/09/2012, 09/06/2014, 08/20/2018, 09/05/2019    Pneumococcal 13 Vaccine (PREVNAR-13) 10/03/2020    Pneumococcal 23 Vaccine (PNEUMOVAX-23) 10/14/2013, 02/11/2021    Tdap 12/02/2011   Deferred Date(s) Deferred    Pneumococcal 23 Vaccine (PNEUMOVAX-23) 02/02/2020     Physical Exam:  BP 102/69 (BP Location: Right arm, BP Patient Position: Sitting, BP cuff size: Large)   Pulse 98   Temp 98.5 F (36.9 C) (Temporal)   Resp 16   Ht 5' 10" (1.778 m)   Wt 96.2 kg (212 lb)   SpO2 97%   BMI 30.42 kg/m      General Appearance: ***alert, no distress, pleasant affect, cooperative.  Heart:  JVD ***, PMI ***, normal rate and regular rhythm, no murmurs, clicks, or gallops. ***  Lungs: ***clear to auscultation and percussion. No rales, rhonchi, or wheezes noted. No chest deformities noted.  Abdomen: ***BS normal.  Abdomen soft, non-tender.  No masses or organomegaly.  Extremities:  ***no cyanosis, clubbing, or edema. Has 2+ peripheral pulses.        Lab Data:  Lab Results   Component Value Date    BUN 26 (H) 08/13/2022    CREAT 1.98 (H) 08/13/2022    CL 99 08/13/2022    NA 140 08/13/2022    K 4.4 08/13/2022    CA 9.2 08/13/2022    TBILI 0.47 08/13/2022    ALB 4.1 08/13/2022    TP 7.1 08/13/2022    AST 22 08/13/2022    ALK 76 08/13/2022    BICARB 29 08/13/2022    ALT 25 08/13/2022    GLU 126 (H) 08/13/2022     Lab Results   Component Value Date    WBC 7.9 08/13/2022    RBC 5.50 08/13/2022    HGB 15.2 08/13/2022    HCT 46.5 08/13/2022    MCV 84.5 08/13/2022    MCHC 32.7 08/13/2022    RDW 12.3 08/13/2022    PLT 162 08/13/2022    MPV 11.6 08/13/2022     Lab Results   Component Value Date    A1C 5.7 01/10/2022     Lab Results   Component Value Date     TSH 1.63 01/10/2022     Lab Results   Component Value Date    CHOL 105 01/10/2022    HDL 38 01/10/2022    LDLCALC 43 01/10/2022    TRIG 121 01/10/2022     Lab Results   Component Value Date    SIROT 11.5 08/13/2022     Lab Results   Component Value Date    FKTR 6.5 08/13/2022     No results found for: CSATR  Lab Results   Component Value Date    CMVPL Not Detected 11/01/2021     Lab Results   Component Value Date    DSA ABSENT 08/13/2022       Prior Cardiovascular Studies:   Lab Results   Component Value Date    LV Ejection Fraction 59 02/06/2022          Echo 02/06/22  Summary:   1. The left ventricular size is normal. The left ventricular systolic function is normal.   2. No left ventricular hypertrophy.   3. Normal pattern of left ventricular diastolic filling.   4. EF=59%.   5. Compared to prior study EF now 59%, was 69% 03/01/21.     LHC/IVUS 02/04/22  CONCLUSION:                                                                   1. Myocardial bridging with mild systolic compression of the mid segment    of the left anterior descending coronary artery.                              2. No angiographic evidence of coronary artery disease.                      3. Intimal thickness noted in LAD/LM up to 0.5 mm (Stable to slightly       worse compare to 2022).                                                         4. Non significant FFR at apical LAD.                                        5. Left ventricular end diastolic pressure appears normal.        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    Assessment/Plan:  # Hematuria  # Dysuria  # Chronic pain  Assessment: We had a long frank discussion about patient's chronic pain issues and the heart transplant team's role in this. I discussed with him that when I initially agreed to cover his chronic opiate prescription, this was the assumption that he would  have a provider versed in chronic pain after 3-4 months, but we are at 6 months and has unable to find one. Additionally, I had not put him on a pain contract at that time, but he recently used more opiates without asking and I informed him this was not appropriate, but because he had not established guidelines I was not going to stop at this time. However, going forward until he can establish with a pain physician, we will set up a pain contract and he will need to follow through like a usual pain clinic with us with goal of provider in 3-4 months or I may start tapering. I will augment adjuvant agents additionally for now and we can continue to work on this.  Plan:  -pain contract signed  -urine tox monthly  -clinic follow up month  -oxycodone 10 mg tablets PO, 1 tab every 4 hours moderate pain, 2 tabs every 4 hours for severe pain, no more than 10 tablets a day, total 280 per 28 days.   -diclofenac cream for joint pain  -lidocaine patch for back pain  -trial of pyridium  -increase gaba at night  -siro change as below  -cymbalta as below    # End-stage heart failure s/p orthotopic heart transplant  # Chronic Immunosuppression/Immunomodulation  Assessment: While we thought continuing sirolimus would help prevent recurrent scar tissue from prostate procedure, it may be exacerbating factors now with delayed wound healing. Will try mmf for 1 month.  Plan:   - continue envarsus 6 mg daily, goal trough 4-8  - HOLD sirolimus 3 mg daily, goal trough 4-8 for at least 1 month  - start mmf 1000 mg bid for one month to allow healing  - Continue to monitor for renal toxicities, infection risk and malignancy risk  - continue pravastatin 40 mg daily  - continue aspirin 81 mg daily    # Hypertension  Assessment: controlled  Plan:  -continue lisinopril 20 mg daily  -resume hctz  -nifedipine 30 mg daily    # Dyslipidemia  -continue pravastatin 40 mg daily    # Depression  Assessment: improved mood  Plan:  -increase cymbalta to 120  mg daily     RTC in 1 month       Nicholas W Wettersten, MD  Advanced Heart Failure, Mechanical Circulatory Support, Transplant  Pgr: [phone]

## 2020-11-13 ENCOUNTER — Ambulatory Visit
Admission: RE | Admit: 2020-11-13 | Discharge: 2020-11-13 | Disposition: A | Discharge status: Home / Self Care | Payer: BC Managed Care – PPO | Source: Ambulatory Visit | Attending: Emergency Medicine | Admitting: Emergency Medicine

## 2020-11-13 ENCOUNTER — Other Ambulatory Visit: Payer: Self-pay

## 2020-11-13 VITALS — BP 160/99 | HR 74 | Temp 98.2°F | Resp 20

## 2020-11-13 DIAGNOSIS — Z113 Encounter for screening for infections with a predominantly sexual mode of transmission: Secondary | ICD-10-CM | POA: Diagnosis not present

## 2020-11-13 DIAGNOSIS — N898 Other specified noninflammatory disorders of vagina: Secondary | ICD-10-CM | POA: Insufficient documentation

## 2020-11-13 NOTE — ED Provider Notes (Signed)
[facility]    CSN: [id number] Arrival date & time: 11/13/20  1857      History   Chief Complaint Chief Complaint  Patient presents with  . Vaginal Discharge    HPI Bonnie Dickson is a 49 y.o. female  Presenting for vaginal discharge.  States she was seen 2 weeks ago and was treated for BV.  Had to switch medications due to allergy, the completed course that was administered.  States she did not have discharge at the time, just vaginal irritation.  States that has resolved, though having discharge.  Denies malodor, urinary symptoms.  Requesting STD check.  Past Medical History:  Diagnosis Date  . Depression    situational  . Diabetes mellitus without complication (HCC)    diet controlled  . Endometriosis   . History of chicken pox   . HSV-2 (herpes simplex virus 2) infection   . Hyperglycemia 07/24/2019  . Hyperlipidemia   . Hypertension   . Insomnia   . Snoring   . Tonsillitis and adenoiditis, chronic    childhood, caused adult snoring    Patient Active Problem List   Diagnosis Date Noted  . Preventative health care 10/19/2019  . Hypertension 07/25/2019  . Diabetes mellitus without complication (HCC)   . Hyperlipidemia   . HSV-2 (herpes simplex virus 2) infection   . Insomnia   . Endometriosis   . History of IBS 07/18/2019  . Snoring   . History of chicken pox   . Depression     Past Surgical History:  Procedure Laterality Date  . ABDOMINAL HYSTERECTOMY     ovaries left in place  . BREAST CYST EXCISION Right    35 years ago   . COMBINED ABDOMINOPLASTY AND LIPOSUCTION    . ENDOMETRIAL ABLATION    . ENDOMETRIAL BIOPSY    . LAPAROSCOPIC ENDOMETRIOSIS FULGURATION    . PARTIAL HYSTERECTOMY     vaginal  . TUBAL LIGATION      OB History   No obstetric history on file.      Home Medications    Prior to Admission medications   Medication Sig Start Date End Date Taking? Authorizing Provider  atorvastatin (LIPITOR) 40 MG tablet Take  1 tablet by mouth once daily 10/27/20   Bradd Canary, MD  insulin lispro (HUMALOG) 100 UNIT/ML injection Inject 0.02-0.1 mLs (2-10 Units total) into the skin 3 (three) times daily before meals. FSG 0-200 no units, 201-250- 2 units, 251-300-4 units,301-350-6 units, 351-400-8 units, 401-450-10 units 11/07/20   Bradd Canary, MD  losartan (COZAAR) 50 MG tablet Take 1 tablet by mouth once daily 10/27/20   Bradd Canary, MD  Tinidazole POWD Compound 500mg /27mL 10/30/20   Kozlow, 11/01/20, MD  valACYclovir (VALTREX) 500 MG tablet Take 1 tablet by mouth twice daily 09/28/20   09/30/20, MD    Family History Family History  Problem Relation Age of Onset  . Hypertension Mother   . Diabetes Mother        prediabetes  . Hyperlipidemia Mother   . Diabetes Father   . Cataracts Father   . Eczema Brother   . Asthma Brother   . Diabetes Maternal Grandmother   . Hypertension Maternal Grandmother   . Diabetes Maternal Grandfather   . Hypertension Maternal Grandfather   . Hyperlipidemia Maternal Grandfather   . Heart failure Maternal Grandfather   . COPD Maternal Grandfather   . ODD Son   . ADD / ADHD Son   .  Colon cancer Neg Hx   . Stomach cancer Neg Hx   . Pancreatic cancer Neg Hx   . Esophageal cancer Neg Hx     Social History Social History   Tobacco Use  . Smoking status: Current Every Day Smoker    Packs/day: 0.50    Years: 30.00    Pack years: 15.00    Types: Cigarettes  . Smokeless tobacco: Never Used  Vaping Use  . Vaping Use: Never used  Substance Use Topics  . Alcohol use: Never  . Drug use: Never     Allergies   Ciprofloxacin hcl, Gentamicin, Penicillins, Ambien [zolpidem tartrate], Asa [aspirin], Ivp dye [iodinated diagnostic agents], Lisinopril, Metformin and related, Nsaids, Codeine, Flagyl [metronidazole], and Morphine and related   Review of Systems Review of Systems  Constitutional: Negative for fatigue and fever.  Respiratory: Negative for cough and  shortness of breath.   Cardiovascular: Negative for chest pain and palpitations.  Gastrointestinal: Negative for constipation and diarrhea.  Genitourinary: Positive for vaginal discharge. Negative for dysuria, flank pain, frequency, hematuria, pelvic pain, urgency, vaginal bleeding and vaginal pain.     Physical Exam Triage Vital Signs ED Triage Vitals  Enc Vitals Group     BP      Pulse      Resp      Temp      Temp src      SpO2      Weight      Height      Head Circumference      Peak Flow      Pain Score      Pain Loc      Pain Edu?      Excl. in GC?    No data found.  Updated Vital Signs BP (!) 160/99 (BP Location: Right Arm)   Pulse 74   Temp 98.2 F (36.8 C) (Oral)   Resp 20   SpO2 97%   Visual Acuity Right Eye Distance:   Left Eye Distance:   Bilateral Distance:    Right Eye Near:   Left Eye Near:    Bilateral Near:     Physical Exam Constitutional:      General: She is not in acute distress. HENT:     Head: Normocephalic and atraumatic.  Eyes:     General: No scleral icterus.    Pupils: Pupils are equal, round, and reactive to light.  Cardiovascular:     Rate and Rhythm: Normal rate.  Pulmonary:     Effort: Pulmonary effort is normal.  Abdominal:     General: Bowel sounds are normal.     Palpations: Abdomen is soft.     Tenderness: There is no abdominal tenderness. There is no right CVA tenderness, left CVA tenderness or guarding.  Genitourinary:    Comments: Patient declined, self-swab performed Skin:    Coloration: Skin is not jaundiced or pale.  Neurological:     Mental Status: She is alert and oriented to person, place, and time.      UC Treatments / Results  Labs (all labs ordered are listed, but only abnormal results are displayed) Labs Reviewed  CERVICOVAGINAL ANCILLARY ONLY    EKG   Radiology No results found.  Procedures Procedures (including critical care time)  Medications Ordered in UC Medications - No data  to display  Initial Impression / Assessment and Plan / UC Course  I have reviewed the triage vital signs and the nursing notes.  Pertinent labs &  imaging results that were available during my care of the patient were reviewed by me and considered in my medical decision making (see chart for details).     Per chart review, previous cytology (10/18/20) positive for yeast, BV, trichomonas.  Patient's request, cytology pending: Discussed risk of false positive giving recent infection/treatment.  Return precautions discussed, pt verbalized understanding and is agreeable to plan. Final Clinical Impressions(s) / UC Diagnoses   Final diagnoses:  Screening examination for venereal disease  Vaginal discharge     Discharge Instructions     Testing for chlamydia, gonorrhea, trichomonas is pending: please look for these results on the MyChart app/website.  We will notify you if you are positive and outline treatment at that time.  Important to avoid all forms of sexual intercourse (oral, vaginal, anal) with any/all partners for the next 7 days to avoid spreading/reinfecting. Any/all sexual partners should be notified of testing/treatment today.  Return for persistent/worsening symptoms or if you develop fever, abdominal or pelvic pain, discharge, genital pain, blood in your urine, or are re-exposed to an STI.    ED Prescriptions    None     PDMP not reviewed this encounter.   Hall-Potvin, [place], [place] 11/13/20 2041

## 2020-11-13 NOTE — ED Triage Notes (Signed)
Pt states was tx's for bv and std 2 wks ago. States she had to switch meds d/t allergic but did complete what was given. States still having slight discharge. States wants std recheck.

## 2020-11-13 NOTE — Discharge Instructions (Addendum)

## 2020-11-15 ENCOUNTER — Telehealth (HOSPITAL_COMMUNITY): Payer: Self-pay | Admitting: Emergency Medicine

## 2020-11-15 LAB — CERVICOVAGINAL ANCILLARY ONLY
Chlamydia: NEGATIVE
Comment: NEGATIVE
Comment: NEGATIVE
Comment: NORMAL
Neisseria Gonorrhea: NEGATIVE
Trichomonas: POSITIVE — AB

## 2020-11-15 MED ORDER — TINIDAZOLE 500 MG PO TABS: 2.0000 g | ORAL_TABLET | Freq: Once | ORAL | 0 refills | Status: AC

## 2020-11-20 ENCOUNTER — Other Ambulatory Visit (HOSPITAL_COMMUNITY)
Admission: RE | Admit: 2020-11-20 | Discharge: 2020-11-20 | Disposition: A | Discharge status: Home / Self Care | Payer: BC Managed Care – PPO | Source: Ambulatory Visit | Attending: Family Medicine | Admitting: Family Medicine

## 2020-11-20 ENCOUNTER — Ambulatory Visit (INDEPENDENT_AMBULATORY_CARE_PROVIDER_SITE_OTHER): Payer: BC Managed Care – PPO | Admitting: Family Medicine

## 2020-11-20 ENCOUNTER — Other Ambulatory Visit: Payer: Self-pay

## 2020-11-20 ENCOUNTER — Encounter: Payer: Self-pay | Admitting: Family Medicine

## 2020-11-20 VITALS — BP 112/82 | HR 84 | Temp 98.6°F | Resp 16 | Ht 66.5 in | Wt 198.6 lb

## 2020-11-20 DIAGNOSIS — A5909 Other urogenital trichomoniasis: Secondary | ICD-10-CM | POA: Insufficient documentation

## 2020-11-20 DIAGNOSIS — N761 Subacute and chronic vaginitis: Secondary | ICD-10-CM

## 2020-11-20 DIAGNOSIS — Z23 Encounter for immunization: Secondary | ICD-10-CM | POA: Diagnosis not present

## 2020-11-20 DIAGNOSIS — Z113 Encounter for screening for infections with a predominantly sexual mode of transmission: Secondary | ICD-10-CM | POA: Diagnosis not present

## 2020-11-20 DIAGNOSIS — N760 Acute vaginitis: Secondary | ICD-10-CM | POA: Diagnosis not present

## 2020-11-20 DIAGNOSIS — Z8719 Personal history of other diseases of the digestive system: Secondary | ICD-10-CM | POA: Diagnosis not present

## 2020-11-20 DIAGNOSIS — Z01419 Encounter for gynecological examination (general) (routine) without abnormal findings: Secondary | ICD-10-CM | POA: Diagnosis not present

## 2020-11-20 DIAGNOSIS — Z0000 Encounter for general adult medical examination without abnormal findings: Secondary | ICD-10-CM

## 2020-11-20 DIAGNOSIS — E785 Hyperlipidemia, unspecified: Secondary | ICD-10-CM | POA: Diagnosis not present

## 2020-11-20 DIAGNOSIS — H539 Unspecified visual disturbance: Secondary | ICD-10-CM

## 2020-11-20 DIAGNOSIS — I10 Essential (primary) hypertension: Secondary | ICD-10-CM

## 2020-11-20 DIAGNOSIS — R87610 Atypical squamous cells of undetermined significance on cytologic smear of cervix (ASC-US): Secondary | ICD-10-CM | POA: Diagnosis not present

## 2020-11-20 DIAGNOSIS — E119 Type 2 diabetes mellitus without complications: Secondary | ICD-10-CM | POA: Diagnosis not present

## 2020-11-20 LAB — CBC WITH DIFFERENTIAL/PLATELET
Basophils Absolute: 0 10*3/uL (ref 0.0–0.1)
Basophils Relative: 0.5 % (ref 0.0–3.0)
Eosinophils Absolute: 0.1 10*3/uL (ref 0.0–0.7)
Eosinophils Relative: 1.6 % (ref 0.0–5.0)
HCT: 44.6 % (ref 36.0–46.0)
Hemoglobin: 15 g/dL (ref 12.0–15.0)
Lymphocytes Relative: 36.2 % (ref 12.0–46.0)
Lymphs Abs: 2.7 10*3/uL (ref 0.7–4.0)
MCHC: 33.6 g/dL (ref 30.0–36.0)
MCV: 95 fl (ref 78.0–100.0)
Monocytes Absolute: 0.5 10*3/uL (ref 0.1–1.0)
Monocytes Relative: 6.4 % (ref 3.0–12.0)
Neutro Abs: 4.1 10*3/uL (ref 1.4–7.7)
Neutrophils Relative %: 55.3 % (ref 43.0–77.0)
Platelets: 328 10*3/uL (ref 150.0–400.0)
RBC: 4.7 Mil/uL (ref 3.87–5.11)
RDW: 13.6 % (ref 11.5–15.5)
WBC: 7.4 10*3/uL (ref 4.0–10.5)

## 2020-11-20 LAB — HEMOGLOBIN A1C: Hgb A1c MFr Bld: 11 % — ABNORMAL HIGH (ref 4.6–6.5)

## 2020-11-20 LAB — TSH: TSH: 1.26 u[IU]/mL (ref 0.35–4.50)

## 2020-11-20 LAB — LIPID PANEL
Cholesterol: 129 mg/dL (ref 0–200)
HDL: 34 mg/dL — ABNORMAL LOW (ref >39.00)
LDL Cholesterol: 85 mg/dL (ref 0–99)
NonHDL: 95.3
Total CHOL/HDL Ratio: 4
Triglycerides: 54 mg/dL (ref 0.0–149.0)
VLDL: 10.8 mg/dL (ref 0.0–40.0)

## 2020-11-20 LAB — COMPREHENSIVE METABOLIC PANEL
ALT: 17 U/L (ref 0–35)
AST: 13 U/L (ref 0–37)
Albumin: 4.5 g/dL (ref 3.5–5.2)
Alkaline Phosphatase: 92 U/L (ref 39–117)
BUN: 11 mg/dL (ref 6–23)
CO2: 30 mEq/L (ref 19–32)
Calcium: 9.6 mg/dL (ref 8.4–10.5)
Chloride: 103 mEq/L (ref 96–112)
Creatinine, Ser: 0.74 mg/dL (ref 0.40–1.20)
GFR: 95.56 mL/min (ref >60.00)
Glucose, Bld: 121 mg/dL — ABNORMAL HIGH (ref 70–99)
Potassium: 4.1 mEq/L (ref 3.5–5.1)
Sodium: 139 mEq/L (ref 135–145)
Total Bilirubin: 0.5 mg/dL (ref 0.2–1.2)
Total Protein: 7.5 g/dL (ref 6.0–8.3)

## 2020-11-20 NOTE — Assessment & Plan Note (Signed)
Well controlled, no changes to meds. Encouraged heart healthy diet such as the DASH diet and exercise as tolerated.  °

## 2020-11-20 NOTE — Patient Instructions (Addendum)
Covid booster in 2 weeks and Tdap 2 weeks after that.    Preventive Care 75-48 Years Old, Female Preventive care refers to visits with your health care provider and lifestyle choices that can promote health and wellness. This includes:  A yearly physical exam. This may also be called an annual well check.  Regular dental visits and eye exams.  Immunizations.  Screening for certain conditions.  Healthy lifestyle choices, such as eating a healthy diet, getting regular exercise, not using drugs or products that contain nicotine and tobacco, and limiting alcohol use. What can I expect for my preventive care visit? Physical exam Your health care provider will check your:  Height and weight. This may be used to calculate body mass index (BMI), which tells if you are at a healthy weight.  Heart rate and blood pressure.  Skin for abnormal spots. Counseling Your health care provider may ask you questions about your:  Alcohol, tobacco, and drug use.  Emotional well-being.  Home and relationship well-being.  Sexual activity.  Eating habits.  Work and work Statistician.  Method of birth control.  Menstrual cycle.  Pregnancy history. What immunizations do I need?  Influenza (flu) vaccine  This is recommended every year. Tetanus, diphtheria, and pertussis (Tdap) vaccine  You may need a Td booster every 10 years. Varicella (chickenpox) vaccine  You may need this if you have not been vaccinated. Zoster (shingles) vaccine  You may need this after age 7. Measles, mumps, and rubella (MMR) vaccine  You may need at least one dose of MMR if you were born in 1957 or later. You may also need a second dose. Pneumococcal conjugate (PCV13) vaccine  You may need this if you have certain conditions and were not previously vaccinated. Pneumococcal polysaccharide (PPSV23) vaccine  You may need one or two doses if you smoke cigarettes or if you have certain  conditions. Meningococcal conjugate (MenACWY) vaccine  You may need this if you have certain conditions. Hepatitis A vaccine  You may need this if you have certain conditions or if you travel or work in places where you may be exposed to hepatitis A. Hepatitis B vaccine  You may need this if you have certain conditions or if you travel or work in places where you may be exposed to hepatitis B. Haemophilus influenzae type b (Hib) vaccine  You may need this if you have certain conditions. Human papillomavirus (HPV) vaccine  If recommended by your health care provider, you may need three doses over 6 months. You may receive vaccines as individual doses or as more than one vaccine together in one shot (combination vaccines). Talk with your health care provider about the risks and benefits of combination vaccines. What tests do I need? Blood tests  Lipid and cholesterol levels. These may be checked every 5 years, or more frequently if you are over 56 years old.  Hepatitis C test.  Hepatitis B test. Screening  Lung cancer screening. You may have this screening every year starting at age 70 if you have a 30-pack-year history of smoking and currently smoke or have quit within the past 15 years.  Colorectal cancer screening. All adults should have this screening starting at age 93 and continuing until age 29. Your health care provider may recommend screening at age 48 if you are at increased risk. You will have tests every 1-10 years, depending on your results and the type of screening test.  Diabetes screening. This is done by checking your blood sugar (  glucose) after you have not eaten for a while (fasting). You may have this done every 1-3 years.  Mammogram. This may be done every 1-2 years. Talk with your health care provider about when you should start having regular mammograms. This may depend on whether you have a family history of breast cancer.  BRCA-related cancer screening. This  may be done if you have a family history of breast, ovarian, tubal, or peritoneal cancers.  Pelvic exam and Pap test. This may be done every 3 years starting at age 83. Starting at age 25, this may be done every 5 years if you have a Pap test in combination with an HPV test. Other tests  Sexually transmitted disease (STD) testing.  Bone density scan. This is done to screen for osteoporosis. You may have this scan if you are at high risk for osteoporosis. Follow these instructions at home: Eating and drinking  Eat a diet that includes fresh fruits and vegetables, whole grains, lean protein, and low-fat dairy.  Take vitamin and mineral supplements as recommended by your health care provider.  Do not drink alcohol if: ? Your health care provider tells you not to drink. ? You are pregnant, may be pregnant, or are planning to become pregnant.  If you drink alcohol: ? Limit how much you have to 0-1 drink a day. ? Be aware of how much alcohol is in your drink. In the [place] one drink equals one 12 oz bottle of beer (355 mL), one 5 oz glass of wine (148 mL), or one 1 oz glass of hard liquor (44 mL). Lifestyle  Take daily care of your teeth and gums.  Stay active. Exercise for at least 30 minutes on 5 or more days each week.  Do not use any products that contain nicotine or tobacco, such as cigarettes, e-cigarettes, and chewing tobacco. If you need help quitting, ask your health care provider.  If you are sexually active, practice safe sex. Use a condom or other form of birth control (contraception) in order to prevent pregnancy and STIs (sexually transmitted infections).  If told by your health care provider, take low-dose aspirin daily starting at age 61. What's next?  Visit your health care provider once a year for a well check visit.  Ask your health care provider how often you should have your eyes and teeth checked.  Stay up to date on all vaccines. This information is not  intended to replace advice given to you by your health care provider. Make sure you discuss any questions you have with your health care provider. Document Revised: 07/29/2018 Document Reviewed: 07/29/2018 Elsevier Patient Education  2020 [employer].

## 2020-11-20 NOTE — Assessment & Plan Note (Signed)
Tolerating statin, encouraged heart healthy diet, avoid trans fats, minimize simple carbs and saturated fats. Increase exercise as tolerated 

## 2020-11-20 NOTE — Assessment & Plan Note (Signed)
She recently had steroids for an allergic reaction and her sugars spiked above 500 now down to 124 to 201, average around 140, check A1C today

## 2020-11-20 NOTE — Progress Notes (Signed)
Subjective:    Patient ID: Bonnie Dickson, female    DOB: 06/27/1971, 49 y.o.   MRN: [id number]  Chief Complaint  Patient presents with  . Annual Exam    Pt states that the doctor that gave her the prednisone didn't clarify that her sugar would cause her sugar to be in balance. Pt states that she is having vision issues.    HPI Patient is in today for annual preventative exam and follow up on chronic medical concerns. No recent febrile illness or hospitalizations. She had a recent bout of vaginitis but is feeling better now. No c/o polyuria or polydipsia. 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 She had a flare in her blood sugars to vover 500 when her allergist gave her steroids to help her manage taking Flagyl. With her sugar rising her vision got blurry but is slowly improving. She is in need of a new opthamologist. Her daughter was very sick with new onset diabetes but this patient is now doing better. Denies CP/palp/SOB/HA/congestion/fevers/GI or GU c/o. Taking meds as prescribed  Past Medical History:  Diagnosis Date  . Depression    situational  . Diabetes mellitus without complication ([place])    diet controlled  . Endometriosis   . History of chicken pox   . HSV-2 (herpes simplex virus 2) infection   . Hyperglycemia 07/24/2019  . Hyperlipidemia   . Hypertension   . Insomnia   . Snoring   . Tonsillitis and adenoiditis, chronic    childhood, caused adult snoring    Past Surgical History:  Procedure Laterality Date  . ABDOMINAL HYSTERECTOMY     ovaries left in place  . BREAST CYST EXCISION Right    35 years ago   . COMBINED ABDOMINOPLASTY AND LIPOSUCTION    . ENDOMETRIAL ABLATION    . ENDOMETRIAL BIOPSY    . LAPAROSCOPIC ENDOMETRIOSIS FULGURATION    . PARTIAL HYSTERECTOMY     vaginal  . TUBAL LIGATION      Family History  Problem Relation Age of Onset  . Hypertension Mother   . Diabetes Mother        prediabetes  .  Hyperlipidemia Mother   . Diabetes Father   . Cataracts Father   . Eczema Brother   . Asthma Brother   . Diabetes Maternal Grandmother   . Hypertension Maternal Grandmother   . Diabetes Maternal Grandfather   . Hypertension Maternal Grandfather   . Hyperlipidemia Maternal Grandfather   . Heart failure Maternal Grandfather   . COPD Maternal Grandfather   . ODD Son   . ADD / ADHD Son   . Diabetes Daughter   . Hypertension Daughter   . Colon cancer Neg Hx   . Stomach cancer Neg Hx   . Pancreatic cancer Neg Hx   . Esophageal cancer Neg Hx     Social History   Socioeconomic History  . Marital status: Widowed    Spouse name: Not on file  . Number of children: Not on file  . Years of education: Not on file  . Highest education level: Not on file  Occupational History  . Not on file  Tobacco Use  . Smoking status: Current Every Day Smoker    Packs/day: 0.50    Years: 30.00    Pack years: 15.00    Types: Cigarettes  . Smokeless tobacco: Never Used  Vaping Use  . Vaping Use: Never used  Substance and Sexual Activity  . Alcohol  use: Never  . Drug use: Never  . Sexual activity: Not Currently    Birth control/protection: Surgical  Other Topics Concern  . Not on file  Social History Narrative  . Not on file   Social Determinants of Health   Financial Resource Strain: Not on file  Food Insecurity: Not on file  Transportation Needs: Not on file  Physical Activity: Not on file  Stress: Not on file  Social Connections: Not on file  Intimate Partner Violence: Not on file    Outpatient Medications Prior to Visit  Medication Sig Dispense Refill  . atorvastatin (LIPITOR) 40 MG tablet Take 1 tablet by mouth once daily 90 tablet 0  . insulin aspart protamine- aspart (NOVOLOG MIX 70/30) (70-30) 100 UNIT/ML injection Inject into the skin.    Marland Kitchen losartan (COZAAR) 50 MG tablet Take 1 tablet by mouth once daily 90 tablet 0  . Tinidazole POWD Compound 562m/10mL 25 g 0  .  valACYclovir (VALTREX) 500 MG tablet Take 1 tablet by mouth twice daily 60 tablet 0  . insulin lispro (HUMALOG) 100 UNIT/ML injection Inject 0.02-0.1 mLs (2-10 Units total) into the skin 3 (three) times daily before meals. FSG 0-200 no units, 201-250- 2 units, 251-300-4 units,301-350-6 units, 351-400-8 units, 401-450-10 units 10 mL PRN   No facility-administered medications prior to visit.    Allergies  Allergen Reactions  . Ciprofloxacin Hcl Anaphylaxis  . Gentamicin Anaphylaxis  . Penicillins Anaphylaxis  . Ambien [Zolpidem Tartrate] Other (See Comments)    Sleep walking   . Asa [Aspirin] Other (See Comments)    Rectal bleeding   . Ivp Dye [Iodinated Diagnostic Agents] Other (See Comments)    Severe vomiting   . Lisinopril Cough  . Metformin And Related Other (See Comments)    Severe GI upset  . Nsaids Other (See Comments)    Rectal bleeding   . Codeine Rash  . Flagyl [Metronidazole] Rash  . Morphine And Related Rash    Review of Systems  Constitutional: Negative for chills, fever and malaise/fatigue.  HENT: Negative for congestion and hearing loss.   Eyes: Positive for blurred vision. Negative for discharge.  Respiratory: Negative for cough, sputum production and shortness of breath.   Cardiovascular: Negative for chest pain, palpitations and leg swelling.  Gastrointestinal: Negative for abdominal pain, blood in stool, constipation, diarrhea, heartburn, nausea and vomiting.  Genitourinary: Negative for dysuria, frequency, hematuria and urgency.  Musculoskeletal: Negative for back pain, falls and myalgias.  Skin: Negative for rash.  Neurological: Negative for dizziness, sensory change, loss of consciousness, weakness and headaches.  Endo/Heme/Allergies: Negative for environmental allergies. Does not bruise/bleed easily.  Psychiatric/Behavioral: Negative for depression and suicidal ideas. The patient is not nervous/anxious and does not have insomnia.        Objective:     Physical Exam Constitutional:      General: She is not in acute distress.    Appearance: She is well-developed and well-nourished.  HENT:     Head: Normocephalic and atraumatic.  Eyes:     Conjunctiva/sclera: Conjunctivae normal.  Neck:     Thyroid: No thyromegaly.  Cardiovascular:     Rate and Rhythm: Normal rate and regular rhythm.     Heart sounds: Normal heart sounds. No murmur heard.   Pulmonary:     Effort: Pulmonary effort is normal. No respiratory distress.     Breath sounds: Normal breath sounds.  Abdominal:     General: Bowel sounds are normal. There is no distension.  Palpations: Abdomen is soft. There is no mass.     Tenderness: There is no abdominal tenderness.  Genitourinary:    General: Normal vulva.     Vagina: Vaginal discharge present.     Rectum: Normal.     Comments: Scant white discharge Musculoskeletal:        General: No edema.     Cervical back: Neck supple.     Right lower leg: No edema.     Left lower leg: No edema.  Lymphadenopathy:     Cervical: No cervical adenopathy.  Skin:    General: Skin is warm and dry.  Neurological:     Mental Status: She is alert and oriented to person, place, and time.  Psychiatric:        Mood and Affect: Mood and affect normal.        Behavior: Behavior normal.     BP 112/82   Pulse 84   Temp 98.6 F (37 C) (Oral)   Resp 16   Ht 5' 6.5" (1.689 m)   Wt 198 lb 9.6 oz (90.1 kg)   SpO2 98%   BMI 31.57 kg/m  Wt Readings from Last 3 Encounters:  11/20/20 198 lb 9.6 oz (90.1 kg)  10/30/20 202 lb 12.8 oz (92 kg)  11/04/19 191 lb (86.6 kg)    Diabetic Foot Exam - Simple   No data filed    Lab Results  Component Value Date   WBC 7.0 07/28/2019   HGB 15.1 (H) 07/28/2019   HCT 45.0 07/28/2019   PLT 315.0 07/28/2019   CHOL 132 07/28/2019   TRIG 58.0 07/28/2019   HDL 32.50 (L) 07/28/2019   LDLCALC 88 07/28/2019   TSH 1.17 07/28/2019   HGBA1C 6.5 07/28/2019    Lab Results  Component Value  Date   TSH 1.17 07/28/2019   Lab Results  Component Value Date   WBC 7.0 07/28/2019   HGB 15.1 (H) 07/28/2019   HCT 45.0 07/28/2019   MCV 95.3 07/28/2019   PLT 315.0 07/28/2019   No results found for: NA, K, CHLORIDE, CO2, GLUCOSE, BUN, CREATININE, BILITOT, ALKPHOS, AST, ALT, PROT, ALBUMIN, CALCIUM, ANIONGAP, EGFR, GFR Lab Results  Component Value Date   CHOL 132 07/28/2019   Lab Results  Component Value Date   HDL 32.50 (L) 07/28/2019   Lab Results  Component Value Date   LDLCALC 88 07/28/2019   Lab Results  Component Value Date   TRIG 58.0 07/28/2019   Lab Results  Component Value Date   CHOLHDL 4 07/28/2019   Lab Results  Component Value Date   HGBA1C 6.5 07/28/2019       Assessment & Plan:   Problem List Items Addressed This Visit    History of IBS   Relevant Orders   Fecal occult blood, imunochemical([facility])   Diabetes mellitus without complication ([place]) - Primary    She recently had steroids for an allergic reaction and her sugars spiked above 500 now down to 124 to 201, average around 140, check A1C today      Relevant Medications   insulin aspart protamine- aspart (NOVOLOG MIX 70/30) (70-30) 100 UNIT/ML injection   Other Relevant Orders   Hemoglobin A1c   Hyperlipidemia    Tolerating statin, encouraged heart healthy diet, avoid trans fats, minimize simple carbs and saturated fats. Increase exercise as tolerated      Relevant Orders   Lipid panel   Hypertension    Well controlled, no changes to meds. Encouraged heart healthy  diet such as the DASH diet and exercise as tolerated.       Preventative health care    Patient encouraged to maintain heart healthy diet, regular exercise, adequate sleep. Consider daily probiotics. Take medications as prescribed. Labs ordered and reviewed. Patient will do ifit testing as her insurance will not pay for colonoscopy. Pap today.       Relevant Orders   Hemoglobin A1c   CBC with  Differential/Platelet   Comprehensive metabolic panel   Lipid panel   TSH   Cytology - PAP( [facility])   Vaginitis    Recently had a struggle with BV, yeast and Trich after getting back together with an ex and having the condom break will recheck today.        Other Visit Diagnoses    Need for immunization against influenza          I have discontinued Tarina Luhn's insulin lispro. I am also having her maintain her valACYclovir, losartan, atorvastatin, Tinidazole, and insulin aspart protamine- aspart.  No orders of the defined types were placed in this encounter.    Penni Homans, MD

## 2020-11-20 NOTE — Assessment & Plan Note (Signed)
Patient encouraged to maintain heart healthy diet, regular exercise, adequate sleep. Consider daily probiotics. Take medications as prescribed. Labs ordered and reviewed. Patient will do ifit testing as her insurance will not pay for colonoscopy. Pap today.

## 2020-11-20 NOTE — Assessment & Plan Note (Signed)
Recently had a struggle with BV, yeast and Trich after getting back together with an ex and having the condom break will recheck today.

## 2020-11-21 ENCOUNTER — Encounter: Payer: Self-pay | Admitting: Family Medicine

## 2020-11-21 LAB — HEPATITIS C ANTIBODY
Hepatitis C Ab: NONREACTIVE
SIGNAL TO CUT-OFF: 0.01 (ref <1.00)

## 2020-11-21 LAB — HIV ANTIBODY (ROUTINE TESTING W REFLEX): HIV 1&2 Ab, 4th Generation: NONREACTIVE

## 2020-11-21 LAB — RPR: RPR Ser Ql: NONREACTIVE

## 2020-11-22 LAB — CYTOLOGY - PAP
Adequacy: ABSENT
Chlamydia: NEGATIVE
Comment: NEGATIVE
Comment: NEGATIVE
Comment: NEGATIVE
Comment: NORMAL
Diagnosis: UNDETERMINED — AB
High risk HPV: NEGATIVE
Neisseria Gonorrhea: NEGATIVE
Trichomonas: POSITIVE — AB

## 2020-11-25 ENCOUNTER — Other Ambulatory Visit: Payer: Self-pay | Admitting: Family Medicine

## 2020-11-25 MED ORDER — TINIDAZOLE 500 MG PO TABS: 2.0000 g | ORAL_TABLET | Freq: Every day | ORAL | 0 refills | Status: DC

## 2020-11-26 ENCOUNTER — Other Ambulatory Visit: Payer: Self-pay | Admitting: Family Medicine

## 2020-11-27 MED ORDER — VALACYCLOVIR HCL 500 MG PO TABS
500.0000 mg | ORAL_TABLET | Freq: Two times a day (BID) | ORAL | 1 refills | Status: DC
Start: 2020-11-27 — End: 2021-09-23

## 2021-02-06 ENCOUNTER — Other Ambulatory Visit: Payer: Self-pay | Admitting: Family Medicine

## 2021-03-25 ENCOUNTER — Other Ambulatory Visit: Payer: Self-pay

## 2021-03-25 ENCOUNTER — Observation Stay (HOSPITAL_COMMUNITY)
Admission: EM | Admit: 2021-03-25 | Discharge: 2021-03-26 | Disposition: A | Discharge status: Home / Self Care | Payer: BC Managed Care – PPO | Attending: Internal Medicine | Admitting: Internal Medicine

## 2021-03-25 ENCOUNTER — Emergency Department (HOSPITAL_COMMUNITY): Payer: BC Managed Care – PPO

## 2021-03-25 DIAGNOSIS — E119 Type 2 diabetes mellitus without complications: Secondary | ICD-10-CM | POA: Diagnosis not present

## 2021-03-25 DIAGNOSIS — Z20822 Contact with and (suspected) exposure to covid-19: Secondary | ICD-10-CM | POA: Diagnosis not present

## 2021-03-25 DIAGNOSIS — R079 Chest pain, unspecified: Secondary | ICD-10-CM | POA: Diagnosis not present

## 2021-03-25 DIAGNOSIS — R000 Tachycardia, unspecified: Secondary | ICD-10-CM | POA: Diagnosis not present

## 2021-03-25 DIAGNOSIS — Z79899 Other long term (current) drug therapy: Secondary | ICD-10-CM | POA: Diagnosis not present

## 2021-03-25 DIAGNOSIS — R0789 Other chest pain: Secondary | ICD-10-CM | POA: Diagnosis not present

## 2021-03-25 DIAGNOSIS — I10 Essential (primary) hypertension: Secondary | ICD-10-CM | POA: Diagnosis not present

## 2021-03-25 DIAGNOSIS — F17210 Nicotine dependence, cigarettes, uncomplicated: Secondary | ICD-10-CM | POA: Diagnosis not present

## 2021-03-25 DIAGNOSIS — R9431 Abnormal electrocardiogram [ECG] [EKG]: Secondary | ICD-10-CM | POA: Insufficient documentation

## 2021-03-25 DIAGNOSIS — R21 Rash and other nonspecific skin eruption: Secondary | ICD-10-CM | POA: Diagnosis not present

## 2021-03-25 DIAGNOSIS — E785 Hyperlipidemia, unspecified: Secondary | ICD-10-CM | POA: Diagnosis not present

## 2021-03-25 DIAGNOSIS — E109 Type 1 diabetes mellitus without complications: Secondary | ICD-10-CM

## 2021-03-25 DIAGNOSIS — Z794 Long term (current) use of insulin: Secondary | ICD-10-CM | POA: Diagnosis not present

## 2021-03-25 LAB — CBC
HCT: 45.1 % (ref 36.0–46.0)
Hemoglobin: 14.9 g/dL (ref 12.0–15.0)
MCH: 31.6 pg (ref 26.0–34.0)
MCHC: 33 g/dL (ref 30.0–36.0)
MCV: 95.6 fL (ref 80.0–100.0)
Platelets: 328 10*3/uL (ref 150–400)
RBC: 4.72 MIL/uL (ref 3.87–5.11)
RDW: 12.5 % (ref 11.5–15.5)
WBC: 7.9 10*3/uL (ref 4.0–10.5)
nRBC: 0 % (ref 0.0–0.2)

## 2021-03-25 LAB — RESP PANEL BY RT-PCR (FLU A&B, COVID) ARPGX2
Influenza A by PCR: NEGATIVE
Influenza B by PCR: NEGATIVE
SARS Coronavirus 2 by RT PCR: NEGATIVE

## 2021-03-25 LAB — TROPONIN I (HIGH SENSITIVITY)
Troponin I (High Sensitivity): 23 ng/L — ABNORMAL HIGH (ref <18)
Troponin I (High Sensitivity): 32 ng/L — ABNORMAL HIGH (ref <18)

## 2021-03-25 LAB — I-STAT BETA HCG BLOOD, ED (MC, WL, AP ONLY): I-stat hCG, quantitative: <5 m[IU]/mL (ref <5)

## 2021-03-25 LAB — BASIC METABOLIC PANEL
Anion gap: 8 (ref 5–15)
BUN: 8 mg/dL (ref 6–20)
CO2: 26 mmol/L (ref 22–32)
Calcium: 9.3 mg/dL (ref 8.9–10.3)
Chloride: 100 mmol/L (ref 98–111)
Creatinine, Ser: 0.64 mg/dL (ref 0.44–1.00)
GFR, Estimated: >60 mL/min (ref >60)
Glucose, Bld: 300 mg/dL — ABNORMAL HIGH (ref 70–99)
Potassium: 3.8 mmol/L (ref 3.5–5.1)
Sodium: 134 mmol/L — ABNORMAL LOW (ref 135–145)

## 2021-03-25 MED ORDER — LOSARTAN POTASSIUM 50 MG PO TABS
50.0000 mg | ORAL_TABLET | Freq: Every day | ORAL | Status: DC
  Administered 2021-03-26: 50 mg via ORAL
  Filled 2021-03-25: qty 1

## 2021-03-25 MED ORDER — ATORVASTATIN CALCIUM 40 MG PO TABS
40.0000 mg | ORAL_TABLET | Freq: Every day | ORAL | Status: DC
  Administered 2021-03-26: 40 mg via ORAL
  Filled 2021-03-25: qty 1

## 2021-03-25 MED ORDER — ONDANSETRON HCL 4 MG/2ML IJ SOLN: 4.0000 mg | Freq: Four times a day (QID) | INTRAMUSCULAR | Status: DC | PRN

## 2021-03-25 MED ORDER — ACETAMINOPHEN 500 MG PO TABS
1000.0000 mg | ORAL_TABLET | Freq: Once | ORAL | Status: AC
  Administered 2021-03-25: 1000 mg via ORAL
  Filled 2021-03-25: qty 2

## 2021-03-25 MED ORDER — ACETAMINOPHEN 325 MG PO TABS
650.0000 mg | ORAL_TABLET | ORAL | Status: DC | PRN
  Administered 2021-03-26 (×2): 650 mg via ORAL
  Filled 2021-03-25 (×2): qty 2

## 2021-03-25 MED ORDER — INSULIN ASPART 100 UNIT/ML ~~LOC~~ SOLN
0.0000 [IU] | SUBCUTANEOUS | Status: DC
  Administered 2021-03-26 (×2): 3 [IU] via SUBCUTANEOUS
  Administered 2021-03-26: 5 [IU] via SUBCUTANEOUS
  Administered 2021-03-26: 8 [IU] via SUBCUTANEOUS

## 2021-03-25 MED ORDER — ENOXAPARIN SODIUM 40 MG/0.4ML ~~LOC~~ SOLN
40.0000 mg | SUBCUTANEOUS | Status: DC
  Administered 2021-03-26: 40 mg via SUBCUTANEOUS
  Filled 2021-03-25: qty 0.4

## 2021-03-25 NOTE — ED Provider Notes (Signed)
[facility] EMERGENCY DEPARTMENT Provider Note   CSN: [id number] Arrival date & time: 03/25/21  1718     History Chief Complaint  Patient presents with  . Chest Pain    Bonnie Dickson is a 50 y.o. female.  Patient with history of high blood pressure, diabetes, cigarette smoking, high cholesterol presents with intermittent chest pain episodes for the past 2 days associated mild shortness of breath and nausea.  Patient denies blood clot risk factors.  Patient denies any history of DVT or PE.  Patient has had multiple stress tests which she was told unremarkable however told on one of the test she had there were ischemic changes.  Symptoms currently mild however patient has waves of more severe symptoms with intermittent radiation to the back.  Patient has multiple medication allergies and IV dye allergy.        Past Medical History:  Diagnosis Date  . Depression    situational  . Diabetes mellitus without complication (HCC)    diet controlled  . Endometriosis   . History of chicken pox   . HSV-2 (herpes simplex virus 2) infection   . Hyperglycemia 07/24/2019  . Hyperlipidemia   . Hypertension   . Insomnia   . Snoring   . Tonsillitis and adenoiditis, chronic    childhood, caused adult snoring    Patient Active Problem List   Diagnosis Date Noted  . NSTEMI (non-ST elevated myocardial infarction) (HCC) 03/31/2021  . Chest pain, rule out acute myocardial infarction 03/25/2021  . Vaginitis 11/20/2020  . Preventative health care 10/19/2019  . Hypertension 07/25/2019  . Diabetes mellitus without complication (HCC)   . Hyperlipidemia   . HSV-2 (herpes simplex virus 2) infection   . Insomnia   . Endometriosis   . History of IBS 07/18/2019  . Snoring   . History of chicken pox   . Depression     Past Surgical History:  Procedure Laterality Date  . ABDOMINAL HYSTERECTOMY     ovaries left in place  . BREAST CYST EXCISION Right    35 years ago   .  COMBINED ABDOMINOPLASTY AND LIPOSUCTION    . ENDOMETRIAL ABLATION    . ENDOMETRIAL BIOPSY    . LAPAROSCOPIC ENDOMETRIOSIS FULGURATION    . PARTIAL HYSTERECTOMY     vaginal  . TUBAL LIGATION       OB History   No obstetric history on file.     Family History  Problem Relation Age of Onset  . Hypertension Mother   . Diabetes Mother        prediabetes  . Hyperlipidemia Mother   . Diabetes Father   . Cataracts Father   . Eczema Brother   . Asthma Brother   . Diabetes Maternal Grandmother   . Hypertension Maternal Grandmother   . Diabetes Maternal Grandfather   . Hypertension Maternal Grandfather   . Hyperlipidemia Maternal Grandfather   . Heart failure Maternal Grandfather   . COPD Maternal Grandfather   . ODD Son   . ADD / ADHD Son   . Diabetes Daughter   . Hypertension Daughter   . Colon cancer Neg Hx   . Stomach cancer Neg Hx   . Pancreatic cancer Neg Hx   . Esophageal cancer Neg Hx     Social History   Tobacco Use  . Smoking status: Current Every Day Smoker    Packs/day: 0.50    Years: 30.00    Pack years: 15.00    Types: Cigarettes  .  Smokeless tobacco: Never Used  Vaping Use  . Vaping Use: Never used  Substance Use Topics  . Alcohol use: Never  . Drug use: Never    Home Medications Prior to Admission medications   Medication Sig Start Date End Date Taking? Authorizing Provider  atorvastatin (LIPITOR) 40 MG tablet Take 1 tablet by mouth once daily Patient taking differently: Take 40 mg by mouth at bedtime. 02/07/21  Yes Bradd Canary, MD  insulin regular (NOVOLIN R) 100 units/mL injection Inject 2-5 Units into the skin 3 (three) times daily before meals. Patient not taking: No sig reported   Yes [provider]  losartan (COZAAR) 50 MG tablet Take 1 tablet by mouth once daily Patient taking differently: Take 50 mg by mouth in the morning. 02/07/21  Yes Bradd Canary, MD  valACYclovir (VALTREX) 500 MG tablet Take 1 tablet (500 mg total) by  mouth 2 (two) times daily. 11/27/20  Yes Bradd Canary, MD  amLODipine (NORVASC) 5 MG tablet Take 1 tablet (5 mg total) by mouth daily. Patient taking differently: Take 5 mg by mouth in the morning. 03/27/21   Azucena Fallen, MD  NOVOLOG RELION 100 UNIT/ML injection Inject 0-10 Units into the skin See admin instructions. Inject 0-10 units into the skin up to four times a day, PER SLIDING SCALE    [provider]    Allergies    Ciprofloxacin hcl, Gentamicin, Penicillins, Ambien [zolpidem tartrate], Asa [aspirin], Banana, Ivp dye [iodinated diagnostic agents], Lisinopril, Metformin and related, Nsaids, Pineapple, Tape, Codeine, Flagyl [metronidazole], and Morphine and related  Review of Systems   Review of Systems  Constitutional: Negative for chills and fever.  HENT: Negative for congestion.   Eyes: Negative for visual disturbance.  Respiratory: Positive for shortness of breath. Negative for cough.   Cardiovascular: Positive for chest pain. Negative for leg swelling.  Gastrointestinal: Negative for abdominal pain and vomiting.  Genitourinary: Negative for dysuria and flank pain.  Musculoskeletal: Negative for back pain, neck pain and neck stiffness.  Skin: Negative for rash.  Neurological: Negative for light-headedness and headaches.    Physical Exam Updated Vital Signs BP (!) 143/83 (BP Location: Right Arm)   Pulse 80   Temp 98.2 F (36.8 C) (Oral)   Resp 19   Ht 5\' 7"  (1.702 m)   Wt 92.2 kg Comment: scale c  SpO2 97%   BMI 31.84 kg/m   Physical Exam Vitals and nursing note reviewed.  Constitutional:      Appearance: She is well-developed.  HENT:     Head: Normocephalic and atraumatic.  Eyes:     General:        Right eye: No discharge.        Left eye: No discharge.     Conjunctiva/sclera: Conjunctivae normal.  Neck:     Trachea: No tracheal deviation.  Cardiovascular:     Rate and Rhythm: Normal rate and regular rhythm.     Heart sounds: Heart  sounds not distant. No murmur heard.     Comments: Patient has equal 2+ radial pulses upper extremities. Pulmonary:     Effort: Pulmonary effort is normal.     Breath sounds: Normal breath sounds.  Abdominal:     General: There is no distension.     Palpations: Abdomen is soft.     Tenderness: There is no abdominal tenderness. There is no guarding.  Musculoskeletal:     Cervical back: Normal range of motion and neck supple.  Right lower leg: No tenderness. No edema.     Left lower leg: No tenderness. No edema.  Skin:    General: Skin is warm.     Findings: No rash.  Neurological:     General: No focal deficit present.     Mental Status: She is alert and oriented to person, place, and time.     ED Results / Procedures / Treatments   Labs (all labs ordered are listed, but only abnormal results are displayed) Labs Reviewed  BASIC METABOLIC PANEL - Abnormal; Notable for the following components:      Result Value   Sodium 134 (*)    Glucose, Bld 300 (*)    All other components within normal limits  GLUCOSE, CAPILLARY - Abnormal; Notable for the following components:   Glucose-Capillary 237 (*)    All other components within normal limits  GLUCOSE, CAPILLARY - Abnormal; Notable for the following components:   Glucose-Capillary 181 (*)    All other components within normal limits  GLUCOSE, CAPILLARY - Abnormal; Notable for the following components:   Glucose-Capillary 156 (*)    All other components within normal limits  CBG MONITORING, ED - Abnormal; Notable for the following components:   Glucose-Capillary 263 (*)    All other components within normal limits  TROPONIN I (HIGH SENSITIVITY) - Abnormal; Notable for the following components:   Troponin I (High Sensitivity) 23 (*)    All other components within normal limits  TROPONIN I (HIGH SENSITIVITY) - Abnormal; Notable for the following components:   Troponin I (High Sensitivity) 32 (*)    All other components within  normal limits  TROPONIN I (HIGH SENSITIVITY) - Abnormal; Notable for the following components:   Troponin I (High Sensitivity) 29 (*)    All other components within normal limits  RESP PANEL BY RT-PCR (FLU A&B, COVID) ARPGX2  CBC  I-STAT BETA HCG BLOOD, ED ([facility], [facility], [facility] ONLY)    EKG EKG Interpretation  Date/Time:  Monday March 25 2021 17:27:00 EDT Ventricular Rate:  109 PR Interval:  166 QRS Duration: 78 QT Interval:  330 QTC Calculation: 444 R Axis:   71 Text Interpretation: Sinus tachycardia Right atrial enlargement T wave abnormality, consider lateral ischemia Abnormal ECG Confirmed by Blane Ohara [phone]) on 03/25/2021 6:48:43 PM   Radiology DG Chest 2 View  Result Date: 03/31/2021 CLINICAL DATA:  Chest pain, shortness breath, nausea and vomiting for 8 days. EXAM: CHEST - 2 VIEW COMPARISON:  Chest x-ray dated 03/25/2021. FINDINGS: Heart size and mediastinal contours are within normal limits. Lungs are clear. No pleural effusion or pneumothorax is seen. Osseous structures about the chest are unremarkable. IMPRESSION: No active cardiopulmonary disease.  No evidence of pneumonia. Electronically Signed   By: Bary Richard M.D.   On: 03/31/2021 13:54   DG Chest Port 1 View  Result Date: 03/31/2021 CLINICAL DATA:  Chest pain for 8 days, nausea and vomiting, tobacco abuse EXAM: PORTABLE CHEST 1 VIEW COMPARISON:  03/31/2021 FINDINGS: Single frontal view of the chest demonstrates an unremarkable cardiac silhouette. Chronic interstitial prominence consistent with scarring and tobacco abuse. No airspace disease, effusion, or pneumothorax. No acute bony abnormalities. IMPRESSION: 1. No acute intrathoracic process. Electronically Signed   By: Sharlet Salina M.D.   On: 03/31/2021 15:46    Procedures Procedures   Medications Ordered in ED Medications  acetaminophen (TYLENOL) tablet 1,000 mg (1,000 mg Oral Given 03/25/21 1959)    ED Course  I have reviewed the triage vital signs  and the  nursing notes.  Pertinent labs & imaging results that were available during my care of the patient were reviewed by me and considered in my medical decision making (see chart for details).    MDM Rules/Calculators/A&P                          Patient with multiple cardiac risk factors and reported ischemic changes and previous cardiac work-up presents with intermittent chest pain associate with nausea and diaphoresis.  Patient is moderate to high risk clinically, initial troponin mild elevated 23 reviewed, sodium mild low 134.  Patient has extreme sensitivity to NSAID and aspirin with GI bleed history.  Tylenol ordered for pain.  Paged cardiology for consult and recommend observation/admission.  EKG lateral T wave findings no old EKG to compare to medical records reviewed no comparison as patient is newer to the area.  Will monitor blood pressure in the ER.  Final Clinical Impression(s) / ED Diagnoses Final diagnoses:  Primary hypertension  Acute chest pain  Abnormal EKG    Rx / DC Orders ED Discharge Orders         Ordered    amLODipine (NORVASC) 5 MG tablet  Daily        03/26/21 1511    Increase activity slowly        03/26/21 1511    Diet - low sodium heart healthy        03/26/21 1511    Call MD for:  severe uncontrolled pain        03/26/21 1511    Call MD for:  difficulty breathing, headache or visual disturbances        03/26/21 1511    Call MD for:  persistant dizziness or light-headedness        03/26/21 1511           Blane Ohara, MD 04/01/21 [phone]

## 2021-03-25 NOTE — H&P (Signed)
History and Physical   Bonnie Dickson QJJ:[id number] DOB: 05-18-1971 DOA: 03/25/2021  PCP: Bradd Canary, MD   Patient coming from: Home  Chief Complaint: Chest pain  HPI: Bonnie Dickson is a 50 y.o. female with medical history significant of depression, diabetes, endometriosis, IBS, hyperlipidemia, hypertension, allergies who presents with chest pain.  Patient has had intermittent chest pain for the past 2 days.  The chest pain has had episodes where it is worse, with radiation to her back and associated shortness of breath, nausea and diaphoresis.  She states the pain occurs with exertion and at rest.  She states that she moved here 3 years ago and has not yet seen a cardiologist but has a history of prior work-up.  She had cath done with chest pain in 2013 in [place] which reportedly showed ischemic changes and she had a NST done that was normal she had another NST done in 2018 which was also normal.   She denies fever, chills, abdominal pain, constipation, diarrhea.  ED Course: Vital signs in the ED significant for blood pressure in the 170s and 180s systolic.  Otherwise stable.  Lab work-up showed BMP with sodium 134, glucose 300.  CBC within normal limits.  Troponin trend 23 and 32 on repeat.  Respiratory panel for flu and COVID-negative.  Chest x-ray with no acute abnormality.  Patient received dose Tylenol in the ED.  Cardiology was consulted and states that they will see the patient.  Review of Systems: As per HPI otherwise all other systems reviewed and are negative.  Past Medical History:  Diagnosis Date  . Depression    situational  . Diabetes mellitus without complication (HCC)    diet controlled  . Endometriosis   . History of chicken pox   . HSV-2 (herpes simplex virus 2) infection   . Hyperglycemia 07/24/2019  . Hyperlipidemia   . Hypertension   . Insomnia   . Snoring   . Tonsillitis and adenoiditis, chronic    childhood, caused adult snoring    Past  Surgical History:  Procedure Laterality Date  . ABDOMINAL HYSTERECTOMY     ovaries left in place  . BREAST CYST EXCISION Right    35 years ago   . COMBINED ABDOMINOPLASTY AND LIPOSUCTION    . ENDOMETRIAL ABLATION    . ENDOMETRIAL BIOPSY    . LAPAROSCOPIC ENDOMETRIOSIS FULGURATION    . PARTIAL HYSTERECTOMY     vaginal  . TUBAL LIGATION     Social History  reports that she has been smoking cigarettes. She has a 15.00 pack-year smoking history. She has never used smokeless tobacco. She reports that she does not drink alcohol and does not use drugs.  Allergies  Allergen Reactions  . Ciprofloxacin Hcl Anaphylaxis  . Gentamicin Anaphylaxis  . Penicillins Anaphylaxis  . Ambien [Zolpidem Tartrate] Other (See Comments)    Sleep walking   . Asa [Aspirin] Other (See Comments)    Rectal bleeding   . Ivp Dye [Iodinated Diagnostic Agents] Other (See Comments)    Severe vomiting   . Lisinopril Cough  . Metformin And Related Other (See Comments)    Severe GI upset  . Nsaids Other (See Comments)    Rectal bleeding   . Codeine Rash  . Flagyl [Metronidazole] Rash  . Morphine And Related Rash    Family History  Problem Relation Age of Onset  . Hypertension Mother   . Diabetes Mother        prediabetes  . Hyperlipidemia Mother   .  Diabetes Father   . Cataracts Father   . Eczema Brother   . Asthma Brother   . Diabetes Maternal Grandmother   . Hypertension Maternal Grandmother   . Diabetes Maternal Grandfather   . Hypertension Maternal Grandfather   . Hyperlipidemia Maternal Grandfather   . Heart failure Maternal Grandfather   . COPD Maternal Grandfather   . ODD Son   . ADD / ADHD Son   . Diabetes Daughter   . Hypertension Daughter   . Colon cancer Neg Hx   . Stomach cancer Neg Hx   . Pancreatic cancer Neg Hx   . Esophageal cancer Neg Hx   Reviewed on admission  Prior to Admission medications   Medication Sig Start Date End Date Taking? Authorizing Provider   atorvastatin (LIPITOR) 40 MG tablet Take 1 tablet by mouth once daily 02/07/21   Bradd Canary, MD  insulin aspart protamine- aspart (NOVOLOG MIX 70/30) (70-30) 100 UNIT/ML injection Inject into the skin.    [provider]  losartan (COZAAR) 50 MG tablet Take 1 tablet by mouth once daily 02/07/21   Bradd Canary, MD  tinidazole [facility]) 500 MG tablet Take 4 tablets (2,000 mg total) by mouth daily with breakfast. 11/25/20   Bradd Canary, MD  Tinidazole POWD Compound 500mg /29mL 10/30/20   Kozlow, 11/01/20, MD  valACYclovir (VALTREX) 500 MG tablet Take 1 tablet (500 mg total) by mouth 2 (two) times daily. 11/27/20   11/29/20, MD    Physical Exam: Vitals:   03/25/21 1724 03/25/21 1724 03/25/21 1945 03/25/21 2000  BP:  (!) 189/112  (!) 178/87  Pulse:  (!) 105 82 78  Resp:  18 19 (!) 21  Temp:  99.3 F (37.4 C)    TempSrc:  Oral    SpO2:  99% 100% 100%  Weight: 90.7 kg     Height: 5\' 7"  (1.702 m)      Physical Exam Constitutional:      General: She is not in acute distress.    Appearance: Normal appearance. She is obese.  HENT:     Head: Normocephalic and atraumatic.     Mouth/Throat:     Mouth: Mucous membranes are moist.     Pharynx: Oropharynx is clear.  Eyes:     Extraocular Movements: Extraocular movements intact.     Pupils: Pupils are equal, round, and reactive to light.  Cardiovascular:     Rate and Rhythm: Normal rate and regular rhythm.     Pulses: Normal pulses.     Heart sounds: Normal heart sounds.  Pulmonary:     Effort: Pulmonary effort is normal. No respiratory distress.     Breath sounds: Normal breath sounds.  Abdominal:     General: Bowel sounds are normal. There is no distension.     Palpations: Abdomen is soft.     Tenderness: There is no abdominal tenderness.  Musculoskeletal:        General: No swelling or deformity.  Skin:    General: Skin is warm and dry.  Neurological:     General: No focal deficit present.     Mental  Status: Mental status is at baseline.    Labs on Admission: I have personally reviewed following labs and imaging studies  CBC: Recent Labs  Lab 03/25/21 1724  WBC 7.9  HGB 14.9  HCT 45.1  MCV 95.6  PLT 328    Basic Metabolic Panel: Recent Labs  Lab 03/25/21 1724  NA 134*  K 3.8  CL 100  CO2 26  GLUCOSE 300*  BUN 8  CREATININE 0.64  CALCIUM 9.3    GFR: Estimated Creatinine Clearance: 99.4 mL/min (by C-G formula based on SCr of 0.64 mg/dL).  Liver Function Tests: No results for input(s): AST, ALT, ALKPHOS, BILITOT, PROT, ALBUMIN in the last 168 hours.  Urine analysis: No results found for: COLORURINE, APPEARANCEUR, LABSPEC, PHURINE, GLUCOSEU, HGBUR, BILIRUBINUR, KETONESUR, PROTEINUR, UROBILINOGEN, NITRITE, LEUKOCYTESUR  Radiological Exams on Admission: DG Chest 2 View  Result Date: 03/25/2021 CLINICAL DATA:  50 year old female with chest pain. EXAM: CHEST - 2 VIEW COMPARISON:  None. FINDINGS: The heart size and mediastinal contours are within normal limits. Both lungs are clear. The visualized skeletal structures are unremarkable. IMPRESSION: No active cardiopulmonary disease. Electronically Signed   By: Elgie Collard M.D.   On: 03/25/2021 17:53   EKG: Independently reviewed.  Sinus tachycardia 109 bpm.  Lateral T wave inversions with T wave flattening in anterior leads.  Assessment/Plan Principal Problem:   Chest pain, rule out acute myocardial infarction Active Problems:   Diabetes mellitus without complication (HCC)   Hyperlipidemia   Hypertension  Chest pain, rule out ACS > Reported history of "ischemic changes ".  Presenting with intermittent chest pain worse with exertion and at times associated with shortness of breath, nausea and diaphoresis. > Troponin trend 23, 32.  EKG showing some T wave inversions in lateral leads and flattening in anterior leads. > Cardiology consulted in ED and will see the patient. > Possibility of a mild troponin bump could  be due to a degree of strain given hypertension in the 170s to 180 systolic. - Appreciate cardiology recommendations - Monitor on telemetry - Continue to trend troponins - EKG as needed - Echocardiogram - Continue home atorvastatin  Hypertension > BP in the 170s 180s systolic in ED - Continue home losartan  Hyperlipidemia - Continue home atorvastatin  Diabetes - SSI  Multiple allergies - Noted the patient has allergies to multiple medications listed in chart including antibiotics, NSAIDs, pain medications, and dye  DVT prophylaxis: Lovenox  Code Status:   Full  Family Communication:  None on admission Disposition Plan:   Patient is from:  Home  Anticipated DC to:  Home  Anticipated DC date:  1 to 2 days  Anticipated DC barriers: None  Consults called:  Cardiology consulted by EDP Admission status:  Observation, telemetry   Severity of Illness: The appropriate patient status for this patient is OBSERVATION. Observation status is judged to be reasonable and necessary in order to provide the required intensity of service to ensure the patient's safety. The patient's presenting symptoms, physical exam findings, and initial radiographic and laboratory data in the context of their medical condition is felt to place them at decreased risk for further clinical deterioration. Furthermore, it is anticipated that the patient will be medically stable for discharge from the hospital within 2 midnights of admission. The following factors support the patient status of observation.   " The patient's presenting symptoms include chest pain, shortness of breath, nausea, diaphoresis. " The physical exam findings include stable exam. " The initial radiographic and laboratory data are troponin trend 23, 32.  Glucose 300, sodium 134.  ED ECG with some lateral T wave changes.   Synetta Fail MD [facility]  How to contact the [facility] Attending or Consulting provider 7A - 7P or covering  provider during after hours 7P -7A, for this patient?   1. Check the care team in  [facility] and look for a) attending/consulting [facility] provider listed and b) the [facility] team listed 2. Log into [email or web address] and use [place]'s universal password to access. If you do not have the password, please contact the hospital operator. 3. Locate the [facility] provider you are looking for under [facility] and page to a number that you can be directly reached. 4. If you still have difficulty reaching the provider, please page the [facility] (Director on Call) for the Hospitalists listed on amion for assistance.  03/25/2021, 10:17 PM

## 2021-03-25 NOTE — ED Triage Notes (Signed)
Pt presents for eval of central chest pain with radiation to back, and associated shob and nausea without vomiting, diaphoresis. Symptoms worse with exertion.

## 2021-03-25 NOTE — Consult Note (Signed)
Cardiology Consultation:   Patient ID: Bonnie Dickson MRN: [id number]; DOB: 03/30/1971  Admit date: 03/25/2021 Date of Consult: 03/25/2021  PCP:  Bonnie Canary, MD   [facility]  Cardiologist:  No primary care provider on file.  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  History of Present Illness:   Ms. Bagent reports intermittent mid-chest sharp pain associated with diaphoresis, shortness of breath and nausea since this past Saturday. The pain radiates to her back sometimes. The pain can occur while she was walking or at best with varied duration. There is no triggering, alleviating or aggravating factors she can recall. She says the pain lasted longer today which she decided to come to our ER. On admission, patient's SBP was 170-180bpm. Denies fever, chills, dizziness, syncope, lightheadedness, heart palpitations, vomiting, heartburn unrelated to meals, abdominal fullness, dysuria, diarrhea, pedal edema or any bleeding events.  Patient reports she had a LHC in 2013 in [facility] in [facility] which she says " some ischemic changes". After that, she had two normal nuclear stress test (most recent one was in 2018).  Patient reports her T2DM was well controlled with diet, however, she was started on prednisone for multiple allergies which has worsened her T2DM in the past 6 months.  Past Medical History:  Diagnosis Date  . Depression    situational  . Diabetes mellitus  without complication (HCC)    diet controlled  . Endometriosis   . History of chicken pox   . HSV-2 (herpes simplex virus 2) infection   . Hyperglycemia 07/24/2019  . Hyperlipidemia   . Hypertension   . Insomnia   . Snoring   . Tonsillitis and adenoiditis, chronic    childhood, caused adult snoring    Past Surgical History:  Procedure Laterality Date  . ABDOMINAL HYSTERECTOMY     ovaries left in place  . BREAST CYST EXCISION Right    35 years ago   . COMBINED ABDOMINOPLASTY AND LIPOSUCTION    . ENDOMETRIAL ABLATION    . ENDOMETRIAL BIOPSY    . LAPAROSCOPIC ENDOMETRIOSIS FULGURATION    . PARTIAL HYSTERECTOMY     vaginal  . TUBAL LIGATION       Home Medications:  Prior to Admission medications   Medication Sig Start Date End Date Taking? Authorizing Provider  atorvastatin (LIPITOR) 40 MG tablet Take 1 tablet by mouth once daily 02/07/21   Bonnie Canary, MD  insulin aspart protamine- aspart (NOVOLOG MIX 70/30) (70-30) 100 UNIT/ML injection Inject into the skin.    [provider]  losartan (COZAAR) 50 MG tablet Take 1 tablet by mouth once daily 02/07/21   Bonnie Canary, MD  tinidazole [facility]) 500 MG tablet Take 4 tablets (2,000 mg total) by mouth daily with breakfast. 11/25/20   Bonnie Canary, MD  Tinidazole POWD Compound 500mg /33mL 10/30/20   Kozlow, 11/01/20, MD  valACYclovir (VALTREX) 500 MG tablet Take 1 tablet (500 mg total) by mouth 2 (two) times  daily. 11/27/20   Bonnie Canary, MD    Inpatient Medications: Scheduled Meds: . [START ON 03/26/2021] atorvastatin  40 mg Oral Daily  . [START ON 03/26/2021] enoxaparin (LOVENOX) injection  40 mg Subcutaneous Q24H  . [START ON 03/26/2021] insulin aspart  0-15 Units Subcutaneous Q4H  . [START ON 03/26/2021] losartan  50 mg Oral Daily   Continuous Infusions:  PRN Meds: acetaminophen, ondansetron (ZOFRAN) IV  Allergies:    Allergies  Allergen Reactions  . Ciprofloxacin Hcl Anaphylaxis  . Gentamicin  Anaphylaxis  . Penicillins Anaphylaxis  . Ambien [Zolpidem Tartrate] Other (See Comments)    Sleep walking   . Asa [Aspirin] Other (See Comments)    Rectal bleeding   . Ivp Dye [Iodinated Diagnostic Agents] Other (See Comments)    Severe vomiting   . Lisinopril Cough  . Metformin And Related Other (See Comments)    Severe GI upset  . Nsaids Other (See Comments)    Rectal bleeding   . Codeine Rash  . Flagyl [Metronidazole] Rash  . Morphine And Related Rash    Social History:   Social History   Socioeconomic History  . Marital status: Widowed    Spouse name: Not on file  . Number of children: Not on file  . Years of education: Not on file  . Highest education level: Not on file  Occupational History  . Not on file  Tobacco Use  . Smoking status: Current Every Day Smoker    Packs/day: 0.50    Years: 30.00    Pack years: 15.00    Types: Cigarettes  . Smokeless tobacco: Never Used  Vaping Use  . Vaping Use: Never used  Substance and Sexual Activity  . Alcohol use: Never  . Drug use: Never  . Sexual activity: Not Currently    Birth control/protection: Surgical  Other Topics Concern  . Not on file  Social History Narrative  . Not on file   Social Determinants of Health   Financial Resource Strain: Not on file  Food Insecurity: Not on file  Transportation Needs: Not on file  Physical Activity: Not on file  Stress: Not on file  Social Connections: Not on file  Intimate Partner Violence: Not on file    Family History:    Family History  Problem Relation Age of Onset  . Hypertension Mother   . Diabetes Mother        prediabetes  . Hyperlipidemia Mother   . Diabetes Father   . Cataracts Father   . Eczema Brother   . Asthma Brother   . Diabetes Maternal Grandmother   . Hypertension Maternal Grandmother   . Diabetes Maternal Grandfather   . Hypertension Maternal Grandfather   . Hyperlipidemia Maternal Grandfather   . Heart failure Maternal Grandfather    . COPD Maternal Grandfather   . ODD Son   . ADD / ADHD Son   . Diabetes Daughter   . Hypertension Daughter   . Colon cancer Neg Hx   . Stomach cancer Neg Hx   . Pancreatic cancer Neg Hx   . Esophageal cancer Neg Hx      ROS:  Please see the history of present illness.   All other ROS reviewed and negative.     Physical Exam/Data:   Vitals:   03/25/21 1724 03/25/21 1724 03/25/21 1945 03/25/21 2000  BP:  (!) 189/112  (!) 178/87  Pulse:  (!) 105 82 78  Resp:  18 19 (!) 21  Temp:  99.3 F (37.4 C)    TempSrc:  Oral    SpO2:  99% 100% 100%  Weight: 90.7 kg     Height: 5\' 7"  (1.702 m)      No intake or output data in the 24 hours ending 03/25/21 2323 Last 3 Weights 03/25/2021 11/20/2020 10/30/2020  Weight (lbs) 200 lb 198 lb 9.6 oz 202 lb 12.8 oz  Weight (kg) 90.719 kg 90.084 kg 91.989 kg     Body mass index is 31.32 kg/m.  General:  Well nourished, well developed, in no acute distress, obesity HEENT: normal Lymph: no adenopathy Neck: no JVD Endocrine:  No thryomegaly Vascular: No carotid bruits; FA pulses 2+ bilaterally without bruits  Cardiac:  normal S1, S2; RRR; no murmur  Lungs:  clear to auscultation bilaterally, no wheezing, rhonchi or rales  Abd: soft, nontender, no hepatomegaly  Ext: no edema Musculoskeletal:  No deformities, BUE and BLE strength normal and equal Skin: warm and dry  Neuro:  CNs 2-12 intact, no focal abnormalities noted Psych:  Normal affect   EKG:  The EKG was personally reviewed and demonstrates SR, STD inferior leads (horizontal), TWI lateral leads Telemetry:  Telemetry was personally reviewed and demonstrates SR at 70bpm   Relevant CV Studies:   Laboratory Data:  High Sensitivity Troponin:   Recent Labs  Lab 03/25/21 1724 03/25/21 2006  TROPONINIHS 23* 32*     Chemistry Recent Labs  Lab 03/25/21 1724  NA 134*  K 3.8  CL 100  CO2 26  GLUCOSE 300*  BUN 8  CREATININE 0.64  CALCIUM 9.3  GFRNONAA >60  ANIONGAP 8     No results for input(s): PROT, ALBUMIN, AST, ALT, ALKPHOS, BILITOT in the last 168 hours. Hematology Recent Labs  Lab 03/25/21 1724  WBC 7.9  RBC 4.72  HGB 14.9  HCT 45.1  MCV 95.6  MCH 31.6  MCHC 33.0  RDW 12.5  PLT 328   BNPNo results for input(s): BNP, PROBNP in the last 168 hours.  DDimer No results for input(s): DDIMER in the last 168 hours.   Radiology/Studies:  DG Chest 2 View  Result Date: 03/25/2021 CLINICAL DATA:  50 year old female with chest pain. EXAM: CHEST - 2 VIEW COMPARISON:  None. FINDINGS: The heart size and mediastinal contours are within normal limits. Both lungs are clear. The visualized skeletal structures are unremarkable. IMPRESSION: No active cardiopulmonary disease. Electronically Signed   By: 53 M.D.   On: 03/25/2021 17:53     Assessment and Plan:  # Chest pain -atypical angina, troponins are slightly elevated, EKGs showed nonspecific TWI lateral leads and STD (horizontal) inferior leads. Currently chest pain minimally, on room air, no acute distress, warm and euvolemic on exam. -continue Telemetry -EKG prn clinical or telemetric changes -continue to trend troponin until it peaks -better blood pressure control -acquire a TTE am. We will review TTE and provide further recommendations. -keep patient NPO p MN   Risk Assessment/Risk Scores:     HEAR Score (for undifferentiated chest pain):       For questions or updates, please contact [facility] Please consult [email or web address] for contact info under    Signed, 03/27/2021, MD  03/25/2021 11:23 PM

## 2021-03-25 NOTE — ED Triage Notes (Signed)
Emergency Medicine Provider Triage Evaluation Note  Bonnie Dickson , a 50 y.o. female  was evaluated in triage.  Pt complains of substernal chest pain that radiates across chest to back associated with shortness of breath and nausea. She notes she has had some "ischemic changes" previously. Moved here 3 years ago and has not established care with a cardiology. Admits to smoking tobacco. No history of blood clots.  Review of Systems  Positive: Chest pain, SOB Negative: vomiting  Physical Exam  BP (!) 189/112 (BP Location: Left Arm)   Pulse (!) 105   Temp 99.3 F (37.4 C) (Oral)   Resp 18   Ht 5\' 7"  (1.702 m)   Wt 90.7 kg   SpO2 99%   BMI 31.32 kg/m  Gen:   Awake, no distress   HEENT:  Atraumatic  Resp:  Normal effort  Cardiac:  Tachycardic Abd:   Nondistended, nontender  MSK:   Moves extremities without difficulty  Neuro:  Speech clear  Medical Decision Making  Medically screening exam initiated at 5:28 PM.  Appropriate orders placed.  Cleopatra Sardo was informed that the remainder of the evaluation will be completed by another provider, this initial triage assessment does not replace that evaluation, and the importance of remaining in the ED until their evaluation is complete.  Clinical Impression  Chest pain. Cardiac labs ordered.   [name], Mannie Stabile 03/25/21 1730

## 2021-03-26 ENCOUNTER — Observation Stay (HOSPITAL_BASED_OUTPATIENT_CLINIC_OR_DEPARTMENT_OTHER): Payer: BC Managed Care – PPO

## 2021-03-26 DIAGNOSIS — R079 Chest pain, unspecified: Secondary | ICD-10-CM | POA: Diagnosis not present

## 2021-03-26 DIAGNOSIS — E119 Type 2 diabetes mellitus without complications: Secondary | ICD-10-CM | POA: Diagnosis not present

## 2021-03-26 DIAGNOSIS — E782 Mixed hyperlipidemia: Secondary | ICD-10-CM | POA: Diagnosis not present

## 2021-03-26 DIAGNOSIS — I160 Hypertensive urgency: Secondary | ICD-10-CM | POA: Diagnosis not present

## 2021-03-26 LAB — ECHOCARDIOGRAM COMPLETE
AR max vel: 2.17 cm2
AV Area VTI: 2.14 cm2
AV Area mean vel: 2.31 cm2
AV Mean grad: 5 mmHg
AV Peak grad: 8.9 mmHg
Ao pk vel: 1.49 m/s
Area-P 1/2: 2.42 cm2
Calc EF: 60.8 %
Height: 67 in
S' Lateral: 2.8 cm
Single Plane A2C EF: 64.3 %
Single Plane A4C EF: 57.9 %
Weight: 3252.8 oz

## 2021-03-26 LAB — GLUCOSE, CAPILLARY
Glucose-Capillary: 237 mg/dL — ABNORMAL HIGH (ref 70–99)
Glucose-Capillary: 181 mg/dL — ABNORMAL HIGH (ref 70–99)
Glucose-Capillary: 156 mg/dL — ABNORMAL HIGH (ref 70–99)

## 2021-03-26 LAB — CBG MONITORING, ED: Glucose-Capillary: 263 mg/dL — ABNORMAL HIGH (ref 70–99)

## 2021-03-26 LAB — TROPONIN I (HIGH SENSITIVITY): Troponin I (High Sensitivity): 29 ng/L — ABNORMAL HIGH (ref <18)

## 2021-03-26 MED ORDER — HYDRALAZINE HCL 20 MG/ML IJ SOLN
10.0000 mg | Freq: Four times a day (QID) | INTRAMUSCULAR | Status: DC | PRN
  Administered 2021-03-26: 10 mg via INTRAVENOUS
  Filled 2021-03-26: qty 1

## 2021-03-26 MED ORDER — AMLODIPINE BESYLATE 5 MG PO TABS: 5.0000 mg | ORAL_TABLET | Freq: Every day | ORAL | 0 refills | Status: DC

## 2021-03-26 MED ORDER — AMLODIPINE BESYLATE 5 MG PO TABS
5.0000 mg | ORAL_TABLET | Freq: Every day | ORAL | Status: DC
  Administered 2021-03-26: 5 mg via ORAL

## 2021-03-26 MED ORDER — NITROGLYCERIN 0.4 MG SL SUBL
0.4000 mg | SUBLINGUAL_TABLET | SUBLINGUAL | Status: DC | PRN
  Administered 2021-03-26 (×2): 0.4 mg via SUBLINGUAL
  Filled 2021-03-26 (×2): qty 1

## 2021-03-26 NOTE — Progress Notes (Signed)
2D echocardiogram completed.  03/26/2021 10:32 AM Eula Fried., MHA, RVT, RDCS, RDMS

## 2021-03-26 NOTE — Plan of Care (Signed)
  Problem: Activity: Goal: Risk for activity intolerance will decrease Outcome: Progressing   Problem: Nutrition: Goal: Adequate nutrition will be maintained Outcome: Progressing   Problem: Coping: Goal: Level of anxiety will decrease Outcome: Progressing   

## 2021-03-26 NOTE — Discharge Summary (Signed)
Physician Discharge Summary  Farah Pears RXV:[id number] DOB: 1971/11/17 DOA: 03/25/2021  PCP: Bradd Canary, MD  Admit date: 03/25/2021 Discharge date: 03/26/2021  Admitted From: Home Disposition: Home  Recommendations for Outpatient Follow-up:  1. Follow up with PCP in 1-2 weeks 2. Please obtain BMP/CBC in one week 3. Please follow up with cardiology as scheduled  Home Health: None Equipment/Devices: None  Discharge Condition: Stable CODE STATUS: Full Diet recommendation: Low-salt low-fat low-carb diet  Brief/Interim Summary:  Bonnie Dickson is a 50 y.o. female with medical history significant of depression, diabetes, endometriosis, IBS, hyperlipidemia, hypertension, allergies who presents with chest pain. Patient has had intermittent chest pain for the past 2 days.  The chest pain has had episodes where it is worse, with radiation to her back and associated shortness of breath, nausea and diaphoresis.  She states the pain occurs with exertion and at rest. She states that she moved here 3 years ago and has not yet seen a cardiologist but has a history of prior work-up.  She had cath done with chest pain in 2013 in [place] which reportedly showed ischemic changes and she had a NST done that was normal she had another NST done in 2018 which was also normal. She denies fever, chills, abdominal pain, constipation, diarrhea.  Patient admitted as above with what appears to be transient episodes of chest pain without clear etiology.  Cardiology following after evaluation and work-up with unremarkable labs imaging and echocardiogram and for her chest pain is likely related to poorly controlled hypertension.  At this time considered hypertensive urgency in the setting of symptoms.  Patient's blood pressure improved somewhat with reinitiation of home medications, with the addition of amlodipine patient's blood pressure is markedly well controlled this morning.  Patient has no further  symptoms or issues today, will discharge home, close follow-up with PCP in 1 to 2 weeks and follow-up with cardiology as scheduled for further evaluation and treatment per their expertise.  We had a lengthy discussion about patient's lifestyle and dietary requirements which would also improve her symptoms and blood pressure.  Discharge Diagnoses:  Principal Problem:   Chest pain, rule out acute myocardial infarction Active Problems:   Diabetes mellitus without complication (HCC)   Hyperlipidemia   Hypertension    Discharge Instructions  Discharge Instructions    Call MD for:  difficulty breathing, headache or visual disturbances   Complete by: As directed    Call MD for:  persistant dizziness or light-headedness   Complete by: As directed    Call MD for:  severe uncontrolled pain   Complete by: As directed    Diet - low sodium heart healthy   Complete by: As directed    Increase activity slowly   Complete by: As directed      Allergies as of 03/26/2021      Reactions   Ciprofloxacin Hcl Anaphylaxis   Gentamicin Anaphylaxis   Penicillins Anaphylaxis   Ambien [zolpidem Tartrate] Other (See Comments)   Sleep walking   Asa [aspirin] Other (See Comments)   Rectal bleeding   Ivp Dye [iodinated Diagnostic Agents] Other (See Comments)   Severe vomiting   Lisinopril Cough   Metformin And Related Other (See Comments)   Severe GI upset   Nsaids Other (See Comments)   Rectal bleeding   Codeine Rash   Flagyl [metronidazole] Rash   Morphine And Related Rash      Medication List    TAKE these medications   amLODipine 5 MG  tablet Commonly known as: NORVASC Take 1 tablet (5 mg total) by mouth daily. Start taking on: March 27, 2021   atorvastatin 40 MG tablet Commonly known as: LIPITOR Take 1 tablet by mouth once daily   insulin regular 100 units/mL injection Commonly known as: NOVOLIN R Inject 2-5 Units into the skin 3 (three) times daily before meals.   losartan 50 MG  tablet Commonly known as: COZAAR Take 1 tablet by mouth once daily   valACYclovir 500 MG tablet Commonly known as: VALTREX Take 1 tablet (500 mg total) by mouth 2 (two) times daily.       Follow-up Information    Sandford Craze'Sullivan, Melissa, NP On 04/01/2021.   Specialty: Internal Medicine Why: @9 :40am Contact information: [address] [place] [id number] [phone]              Allergies  Allergen Reactions  . Ciprofloxacin Hcl Anaphylaxis  . Gentamicin Anaphylaxis  . Penicillins Anaphylaxis  . Ambien [Zolpidem Tartrate] Other (See Comments)    Sleep walking   . Asa [Aspirin] Other (See Comments)    Rectal bleeding   . Ivp Dye [Iodinated Diagnostic Agents] Other (See Comments)    Severe vomiting   . Lisinopril Cough  . Metformin And Related Other (See Comments)    Severe GI upset  . Nsaids Other (See Comments)    Rectal bleeding   . Codeine Rash  . Flagyl [Metronidazole] Rash  . Morphine And Related Rash    Consultations: Cardiology  Procedures/Studies: DG Chest 2 View  Result Date: 03/25/2021 CLINICAL DATA:  50 year old female with chest pain. EXAM: CHEST - 2 VIEW COMPARISON:  None. FINDINGS: The heart size and mediastinal contours are within normal limits. Both lungs are clear. The visualized skeletal structures are unremarkable. IMPRESSION: No active cardiopulmonary disease. Electronically Signed   By: Elgie CollardArash  Radparvar M.D.   On: 03/25/2021 17:53   ECHOCARDIOGRAM COMPLETE  Result Date: 03/26/2021    ECHOCARDIOGRAM REPORT   Patient Name:   Bonnie Dickson Date of Exam: 03/26/2021 Medical Rec #:  [id number]        Height:       67.0 in Accession #:    [id number]       Weight:       203.3 lb Date of Birth:  [birth year]        BSA:          2.036 m Patient Age:    49 years         BP:           163/80 mmHg Patient Gender: F                HR:           62 bpm. Exam Location:  Inpatient Procedure: 2D Echo, Cardiac Doppler and Color Doppler  Indications:    Chest pain  History:        Patient has no prior history of Echocardiogram examinations.                 Risk Factors:Hypertension, Dyslipidemia and Diabetes.  Sonographer:    Gertie FeyMichelle Simonetti MHA, RDMS, RVT, RDCS Referring Phys: [id number] Cecille PoLEXANDER B [facility]  Sonographer Comments: Image acquisition challenging due to patient body habitus. IMPRESSIONS  1. Left ventricular ejection fraction, by estimation, is 60 to 65%. The left ventricle has normal function. The left ventricle has no regional wall motion abnormalities. Left ventricular diastolic parameters are consistent with Grade II diastolic dysfunction (  pseudonormalization). Elevated left atrial pressure.  2. Right ventricular systolic function is normal. The right ventricular size is normal. There is normal pulmonary artery systolic pressure.  3. Left atrial size was mildly dilated.  4. The mitral valve is normal in structure. Trivial mitral valve regurgitation. No evidence of mitral stenosis.  5. The aortic valve is normal in structure. Aortic valve regurgitation is not visualized. No aortic stenosis is present.  6. The inferior vena cava is normal in size with greater than 50% respiratory variability, suggesting right atrial pressure of 3 mmHg. FINDINGS  Left Ventricle: Left ventricular ejection fraction, by estimation, is 60 to 65%. The left ventricle has normal function. The left ventricle has no regional wall motion abnormalities. The left ventricular internal cavity size was normal in size. There is  no left ventricular hypertrophy. Left ventricular diastolic parameters are consistent with Grade II diastolic dysfunction (pseudonormalization). Elevated left atrial pressure. Right Ventricle: The right ventricular size is normal. No increase in right ventricular wall thickness. Right ventricular systolic function is normal. There is normal pulmonary artery systolic pressure. The tricuspid regurgitant velocity is 1.33 m/s, and  with an assumed  right atrial pressure of 3 mmHg, the estimated right ventricular systolic pressure is 10.1 mmHg. Left Atrium: Left atrial size was mildly dilated. Right Atrium: Right atrial size was normal in size. Pericardium: There is no evidence of pericardial effusion. Mitral Valve: The mitral valve is normal in structure. Trivial mitral valve regurgitation. No evidence of mitral valve stenosis. Tricuspid Valve: The tricuspid valve is normal in structure. Tricuspid valve regurgitation is not demonstrated. No evidence of tricuspid stenosis. Aortic Valve: The aortic valve is normal in structure. Aortic valve regurgitation is not visualized. No aortic stenosis is present. Aortic valve mean gradient measures 5.0 mmHg. Aortic valve peak gradient measures 8.9 mmHg. Aortic valve area, by VTI measures 2.14 cm. Pulmonic Valve: The pulmonic valve was normal in structure. Pulmonic valve regurgitation is not visualized. No evidence of pulmonic stenosis. Aorta: The aortic root is normal in size and structure. Venous: The inferior vena cava is normal in size with greater than 50% respiratory variability, suggesting right atrial pressure of 3 mmHg. IAS/Shunts: No atrial level shunt detected by color flow Doppler.  LEFT VENTRICLE PLAX 2D LVIDd:         4.60 cm     Diastology LVIDs:         2.80 cm     LV e' medial:    5.98 cm/s LV PW:         1.00 cm     LV E/e' medial:  16.1 LV IVS:        0.80 cm     LV e' lateral:   5.98 cm/s LVOT diam:     1.90 cm     LV E/e' lateral: 16.1 LV SV:         62 LV SV Index:   30 LVOT Area:     2.84 cm  LV Volumes (MOD) LV vol d, MOD A2C: 69.0 ml LV vol d, MOD A4C: 72.7 ml LV vol s, MOD A2C: 24.6 ml LV vol s, MOD A4C: 30.6 ml LV SV MOD A2C:     44.4 ml LV SV MOD A4C:     72.7 ml LV SV MOD BP:      43.3 ml RIGHT VENTRICLE RV S prime:     11.90 cm/s TAPSE (M-mode): 2.4 cm LEFT ATRIUM  Index       RIGHT ATRIUM           Index LA diam:        4.11 cm 2.02 cm/m  RA Area:     12.90 cm LA Vol (A2C):    68.8 ml 33.79 ml/m RA Volume:   29.90 ml  14.68 ml/m LA Vol (A4C):   58.6 ml 28.78 ml/m LA Biplane Vol: 63.7 ml 31.28 ml/m  AORTIC VALVE AV Area (Vmax):    2.17 cm AV Area (Vmean):   2.31 cm AV Area (VTI):     2.14 cm AV Vmax:           149.00 cm/s AV Vmean:          102.000 cm/s AV VTI:            0.290 m AV Peak Grad:      8.9 mmHg AV Mean Grad:      5.0 mmHg LVOT Vmax:         114.00 cm/s LVOT Vmean:        83.100 cm/s LVOT VTI:          0.219 m LVOT/AV VTI ratio: 0.76  AORTA Ao Root diam: 3.00 cm MITRAL VALVE               TRICUSPID VALVE MV Area (PHT): 2.42 cm    TR Peak grad:   7.1 mmHg MV Decel Time: 313 msec    TR Vmax:        133.00 cm/s MV E velocity: 96.00 cm/s MV A velocity: 93.00 cm/s  SHUNTS MV E/A ratio:  1.03        Systemic VTI:  0.22 m                            Systemic Diam: 1.90 cm Mihai Croitoru MD Electronically signed by Thurmon Fair MD Signature Date/Time: 03/26/2021/11:02:39 AM    Final      Subjective: No acute issues or events overnight denies nausea vomiting diarrhea constipation headache fevers chills chest pain shortness of breath or dyspnea with exertion   Discharge Exam: Vitals:   03/26/21 1128 03/26/21 1506  BP: (!) 153/80 (!) 177/97  Pulse: 69   Resp: 18   Temp: 98 F (36.7 C)   SpO2: 97%    Vitals:   03/26/21 0330 03/26/21 0744 03/26/21 1128 03/26/21 1506  BP: (!) 192/87 (!) 163/80 (!) 153/80 (!) 177/97  Pulse: 77 80 69   Resp: 18 18 18    Temp: 98.2 F (36.8 C) 98.6 F (37 C) 98 F (36.7 C)   TempSrc: Oral Oral Oral   SpO2: 96% 97% 97%   Weight:      Height:        General: Pt is alert, awake, not in acute distress Cardiovascular: RRR, S1/S2 +, no rubs, no gallops Respiratory: CTA bilaterally, no wheezing, no rhonchi Abdominal: Soft, NT, ND, bowel sounds + Extremities: no edema, no cyanosis    The results of significant diagnostics from this hospitalization (including imaging, microbiology, ancillary and laboratory) are listed below  for reference.     Microbiology: Recent Results (from the past 240 hour(s))  Resp Panel by RT-PCR (Flu A&B, Covid) Nasopharyngeal Swab     Status: None   Collection Time: 03/25/21  8:01 PM   Specimen: Nasopharyngeal Swab; Nasopharyngeal(NP) swabs in vial transport medium  Result Value Ref Range Status   SARS Coronavirus  2 by RT PCR NEGATIVE NEGATIVE Final    Comment: (NOTE) SARS-CoV-2 target nucleic acids are NOT DETECTED.  The SARS-CoV-2 RNA is generally detectable in upper respiratory specimens during the acute phase of infection. The lowest concentration of SARS-CoV-2 viral copies this assay can detect is 138 copies/mL. A negative result does not preclude SARS-Cov-2 infection and should not be used as the sole basis for treatment or other patient management decisions. A negative result may occur with  improper specimen collection/handling, submission of specimen other than nasopharyngeal swab, presence of viral mutation(s) within the areas targeted by this assay, and inadequate number of viral copies(<138 copies/mL). A negative result must be combined with clinical observations, patient history, and epidemiological information. The expected result is Negative.  Fact Sheet for Patients:  BloggerCourse.com  Fact Sheet for Healthcare Providers:  SeriousBroker.it  This test is no t yet approved or cleared by the [place] FDA and  has been authorized for detection and/or diagnosis of SARS-CoV-2 by FDA under an Emergency Use Authorization (EUA). This EUA will remain  in effect (meaning this test can be used) for the duration of the COVID-19 declaration under Section 564(b)(1) of the Act, 21 U.S.C.section 360bbb-3(b)(1), unless the authorization is terminated  or revoked sooner.       Influenza A by PCR NEGATIVE NEGATIVE Final   Influenza B by PCR NEGATIVE NEGATIVE Final    Comment: (NOTE) The Xpert Xpress  SARS-CoV-2/FLU/RSV plus assay is intended as an aid in the diagnosis of influenza from Nasopharyngeal swab specimens and should not be used as a sole basis for treatment. Nasal washings and aspirates are unacceptable for Xpert Xpress SARS-CoV-2/FLU/RSV testing.  Fact Sheet for Patients: BloggerCourse.com  Fact Sheet for Healthcare Providers: SeriousBroker.it  This test is not yet approved or cleared by the [place] FDA and has been authorized for detection and/or diagnosis of SARS-CoV-2 by FDA under an Emergency Use Authorization (EUA). This EUA will remain in effect (meaning this test can be used) for the duration of the COVID-19 declaration under Section 564(b)(1) of the Act, 21 U.S.C. section 360bbb-3(b)(1), unless the authorization is terminated or revoked.  Performed at [facility], [address] [place], [place] [address]      Labs: BNP (last 3 results) No results for input(s): BNP in the last 8760 hours. Basic Metabolic Panel: Recent Labs  Lab 03/25/21 1724  NA 134*  K 3.8  CL 100  CO2 26  GLUCOSE 300*  BUN 8  CREATININE 0.64  CALCIUM 9.3   Liver Function Tests: No results for input(s): AST, ALT, ALKPHOS, BILITOT, PROT, ALBUMIN in the last 168 hours. No results for input(s): LIPASE, AMYLASE in the last 168 hours. No results for input(s): AMMONIA in the last 168 hours. CBC: Recent Labs  Lab 03/25/21 1724  WBC 7.9  HGB 14.9  HCT 45.1  MCV 95.6  PLT 328   Cardiac Enzymes: No results for input(s): CKTOTAL, CKMB, CKMBINDEX, TROPONINI in the last 168 hours. BNP: Invalid input(s): POCBNP CBG: Recent Labs  Lab 03/26/21 0017 03/26/21 0405 03/26/21 0821 03/26/21 1129  GLUCAP 263* 237* 181* 156*   D-Dimer No results for input(s): DDIMER in the last 72 hours. Hgb A1c No results for input(s): HGBA1C in the last 72 hours. Lipid Profile No results for input(s): CHOL, HDL, LDLCALC, TRIG,  CHOLHDL, LDLDIRECT in the last 72 hours. Thyroid function studies No results for input(s): TSH, T4TOTAL, T3FREE, THYROIDAB in the last 72 hours.  Invalid input(s): FREET3 Anemia  work up No results for input(s): VITAMINB12, FOLATE, FERRITIN, TIBC, IRON, RETICCTPCT in the last 72 hours. Urinalysis No results found for: COLORURINE, APPEARANCEUR, LABSPEC, PHURINE, GLUCOSEU, HGBUR, BILIRUBINUR, KETONESUR, PROTEINUR, UROBILINOGEN, NITRITE, LEUKOCYTESUR Sepsis Labs Invalid input(s): PROCALCITONIN,  WBC,  LACTICIDVEN Microbiology Recent Results (from the past 240 hour(s))  Resp Panel by RT-PCR (Flu A&B, Covid) Nasopharyngeal Swab     Status: None   Collection Time: 03/25/21  8:01 PM   Specimen: Nasopharyngeal Swab; Nasopharyngeal(NP) swabs in vial transport medium  Result Value Ref Range Status   SARS Coronavirus 2 by RT PCR NEGATIVE NEGATIVE Final    Comment: (NOTE) SARS-CoV-2 target nucleic acids are NOT DETECTED.  The SARS-CoV-2 RNA is generally detectable in upper respiratory specimens during the acute phase of infection. The lowest concentration of SARS-CoV-2 viral copies this assay can detect is 138 copies/mL. A negative result does not preclude SARS-Cov-2 infection and should not be used as the sole basis for treatment or other patient management decisions. A negative result may occur with  improper specimen collection/handling, submission of specimen other than nasopharyngeal swab, presence of viral mutation(s) within the areas targeted by this assay, and inadequate number of viral copies(<138 copies/mL). A negative result must be combined with clinical observations, patient history, and epidemiological information. The expected result is Negative.  Fact Sheet for Patients:  BloggerCourse.com  Fact Sheet for Healthcare Providers:  SeriousBroker.it  This test is no t yet approved or cleared by the [place] FDA and  has been  authorized for detection and/or diagnosis of SARS-CoV-2 by FDA under an Emergency Use Authorization (EUA). This EUA will remain  in effect (meaning this test can be used) for the duration of the COVID-19 declaration under Section 564(b)(1) of the Act, 21 U.S.C.section 360bbb-3(b)(1), unless the authorization is terminated  or revoked sooner.       Influenza A by PCR NEGATIVE NEGATIVE Final   Influenza B by PCR NEGATIVE NEGATIVE Final    Comment: (NOTE) The Xpert Xpress SARS-CoV-2/FLU/RSV plus assay is intended as an aid in the diagnosis of influenza from Nasopharyngeal swab specimens and should not be used as a sole basis for treatment. Nasal washings and aspirates are unacceptable for Xpert Xpress SARS-CoV-2/FLU/RSV testing.  Fact Sheet for Patients: BloggerCourse.com  Fact Sheet for Healthcare Providers: SeriousBroker.it  This test is not yet approved or cleared by the [place] FDA and has been authorized for detection and/or diagnosis of SARS-CoV-2 by FDA under an Emergency Use Authorization (EUA). This EUA will remain in effect (meaning this test can be used) for the duration of the COVID-19 declaration under Section 564(b)(1) of the Act, 21 U.S.C. section 360bbb-3(b)(1), unless the authorization is terminated or revoked.  Performed at [facility], [address] [place], [place] [address]      Time coordinating discharge: Over 30 minutes  SIGNED:   Azucena Fallen, DO [facility] 03/26/2021, 3:20 PM Pager   If 7PM-7AM, please contact night-coverage [email or web address]

## 2021-03-26 NOTE — Progress Notes (Signed)
DAILY PROGRESS NOTE   Patient Name: Bonnie Dickson Date of Encounter: 03/26/2021 Cardiologist: No primary care provider on file.  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  Subjective   Feels better today, had headache overnight - may have been related to nitro. Some nausea, but improved. No further chest pain. Troponin have been flat minimally elevated. Echo personally reviewed this morning (formal read pending) - systolic function is normal without WMA's. LVEDP is elevated, likely due to hypertension - this is most likely hypertensive urgency.  Objective   Vitals:   03/26/21 0030 03/26/21 0114 03/26/21 0330 03/26/21 0744  BP: (!) 183/84 (!) 207/90 (!) 192/87 (!) 163/80  Pulse: 73 69 77 80  Resp: 20 20 18 18   Temp:  98.2 F (36.8 C) 98.2 F (36.8 C) 98.6 F (37 C)  TempSrc:  Oral Oral Oral  SpO2: 100% 95% 96% 97%  Weight:  92.2 kg    Height:  5\' 7"  (1.702 m)      Intake/Output Summary (Last 24 hours) at 03/26/2021 1051 Last data filed at 03/26/2021 0300 Gross per 24 hour  Intake --  Output 300 ml  Net -300 ml   Filed Weights   03/25/21 1724 03/26/21 0114  Weight: 90.7 kg 92.2 kg    Physical Exam   General appearance: alert, no distress and mildly obese Neck: no carotid bruit, no JVD and thyroid not enlarged, symmetric, no tenderness/mass/nodules Lungs: clear to auscultation bilaterally Heart: regular rate and rhythm, S1, S2 normal, no murmur, click, rub or gallop Abdomen: soft, non-tender; bowel sounds normal; no masses,  no organomegaly Extremities: extremities normal, atraumatic, no cyanosis or edema Pulses: 2+ and symmetric Skin: Skin color, texture, turgor normal. No rashes or lesions Neurologic: Grossly normal Psych:  Pleasant  Inpatient Medications    Scheduled Meds: . atorvastatin  40 mg Oral Daily  . enoxaparin (LOVENOX) injection  40 mg Subcutaneous Q24H  . insulin aspart  0-15 Units Subcutaneous Q4H  . losartan  50 mg Oral Daily    Continuous Infusions:   PRN Meds: acetaminophen, hydrALAZINE, nitroGLYCERIN, ondansetron (ZOFRAN) IV   Labs   Results for orders placed or performed during the hospital encounter of 03/25/21 (from the past 48 hour(s))  Basic metabolic panel     Status: Abnormal   Collection Time: 03/25/21  5:24 PM  Result Value Ref Range   Sodium 134 (L) 135 - 145 mmol/L   Potassium 3.8 3.5 - 5.1 mmol/L   Chloride 100 98 - 111 mmol/L   CO2 26 22 - 32 mmol/L   Glucose, Bld 300 (H) 70 - 99 mg/dL    Comment: Glucose reference range applies only to samples taken after fasting for at least 8 hours.   BUN 8 6 - 20 mg/dL   Creatinine, Ser 03/27/21 0.44 - 1.00 mg/dL   Calcium 9.3 8.9 - 03/27/21 mg/dL   GFR, Estimated 2.19 75.8 mL/min    Comment: (NOTE) Calculated using the CKD-EPI Creatinine Equation (2021)    Anion gap 8 5 - 15    Comment: Performed at [facility], [address] [place], [address] [place]  CBC     Status: None   Collection Time: 03/25/21  5:24 PM  Result Value Ref Range  WBC 7.9 4.0 - 10.5 K/uL   RBC 4.72 3.87 - 5.11 MIL/uL   Hemoglobin 14.9 12.0 - 15.0 g/dL   HCT 00.3 49.1 - 79.1 %   MCV 95.6 80.0 - 100.0 fL   MCH 31.6 26.0 - 34.0 pg   MCHC 33.0 30.0 - 36.0 g/dL   RDW 50.5 69.7 - 94.8 %   Platelets 328 150 - 400 K/uL   nRBC 0.0 0.0 - 0.2 %    Comment: Performed at [facility], [address] [place], [place] [address]  Troponin I (High Sensitivity)     Status: Abnormal   Collection Time: 03/25/21  5:24 PM  Result Value Ref Range   Troponin I (High Sensitivity) 23 (H) <18 ng/L    Comment: (NOTE) Elevated high sensitivity troponin I (hsTnI) values and significant  changes across serial measurements may suggest ACS but many other  chronic  and acute conditions are known to elevate hsTnI results.  Refer to the "Links" section for chest pain algorithms and additional  guidance. Performed at [facility], [address], [place], [place] [address]   I-Stat beta hCG blood, ED     Status: None   Collection Time: 03/25/21  5:39 PM  Result Value Ref Range   I-stat hCG, quantitative <5.0 <5 mIU/mL   Comment 3            Comment:   GEST. AGE      CONC.  (mIU/mL)   <=1 WEEK        5 - 50     2 WEEKS       50 - 500     3 WEEKS       100 - 10,000     4 WEEKS     1,000 - 30,000        FEMALE AND NON-PREGNANT FEMALE:     LESS THAN 5 mIU/mL   Resp Panel by RT-PCR (Flu A&B, Covid) Nasopharyngeal Swab     Status: None   Collection Time: 03/25/21  8:01 PM   Specimen: Nasopharyngeal Swab; Nasopharyngeal(NP) swabs in vial transport medium  Result Value Ref Range   SARS Coronavirus 2 by RT PCR NEGATIVE NEGATIVE    Comment: (NOTE) SARS-CoV-2 target nucleic acids are NOT DETECTED.  The SARS-CoV-2 RNA is generally detectable in upper respiratory specimens during the acute phase of infection. The lowest concentration of SARS-CoV-2 viral copies this assay can detect is 138 copies/mL. A negative result does not preclude SARS-Cov-2 infection and should not be used as the sole basis for treatment or other patient management decisions. A negative result may occur with  improper specimen collection/handling, submission of specimen other than nasopharyngeal swab, presence of viral mutation(s) within the areas targeted by this assay, and inadequate number of viral copies(<138 copies/mL). A negative result must be combined with clinical observations, patient history, and epidemiological information. The expected result is Negative.  Fact Sheet for Patients:  BloggerCourse.com  Fact Sheet for Healthcare Providers:  SeriousBroker.it  This test is no t yet approved or cleared by the [place] FDA and  has been authorized for detection and/or diagnosis of SARS-CoV-2 by FDA under an Emergency Use Authorization (EUA). This EUA will remain  in effect (meaning this test can be used) for the duration of the COVID-19 declaration under Section 564(b)(1) of the Act, 21 U.S.C.section 360bbb-3(b)(1), unless the authorization is terminated  or revoked sooner.       Influenza A by  PCR NEGATIVE NEGATIVE   Influenza B by PCR NEGATIVE NEGATIVE    Comment: (NOTE) The Xpert Xpress SARS-CoV-2/FLU/RSV plus assay is intended as an aid in the diagnosis of influenza from Nasopharyngeal swab specimens and should not be used as a sole basis for treatment. Nasal washings and aspirates are unacceptable for Xpert Xpress SARS-CoV-2/FLU/RSV testing.  Fact Sheet for Patients: BloggerCourse.com  Fact Sheet for Healthcare Providers: SeriousBroker.it  This test is not yet approved or cleared by the [place] FDA and has been authorized for detection and/or diagnosis of SARS-CoV-2 by FDA under an Emergency Use Authorization (EUA). This EUA will remain in effect (meaning this test can be used) for the duration of the COVID-19 declaration under Section 564(b)(1) of the Act, 21 U.S.C. section 360bbb-3(b)(1), unless the authorization is terminated or revoked.  Performed at [facility], [address], [place], [place] [address]   Troponin I (High Sensitivity)     Status: Abnormal   Collection Time: 03/25/21  8:06 PM  Result Value Ref Range   Troponin I (High Sensitivity) 32 (H) <18 ng/L    Comment: (NOTE) Elevated high sensitivity troponin I (hsTnI) values and significant  changes across serial measurements may suggest ACS but many other  chronic and acute conditions are known to elevate hsTnI results.  Refer to the "Links" section for chest pain algorithms and additional  guidance. Performed at [facility], [address] [place], [place] [address]   Troponin I (High Sensitivity)     Status: Abnormal   Collection Time: 03/25/21 11:59 PM  Result Value Ref Range   Troponin I (High Sensitivity) 29 (H) <18 ng/L    Comment: (NOTE) Elevated high sensitivity troponin I (hsTnI) values and significant  changes across serial measurements may suggest ACS but many other  chronic and acute conditions are known to elevate hsTnI results.  Refer to the "Links" section for chest pain algorithms and additional  guidance. Performed at [facility], [address], [place], [place] [address]   CBG monitoring, ED     Status: Abnormal   Collection Time: 03/26/21 12:17 AM  Result Value Ref Range   Glucose-Capillary 263 (H) 70 - 99 mg/dL    Comment: Glucose reference range applies only to samples taken after fasting for at least 8 hours.  Glucose, capillary     Status: Abnormal   Collection Time: 03/26/21  4:05 AM  Result Value Ref Range   Glucose-Capillary 237 (H) 70 - 99 mg/dL    Comment: Glucose reference range applies only to samples taken after fasting for at least 8 hours.   Comment 1 Notify RN    Comment 2 Document in Chart   Glucose, capillary     Status: Abnormal   Collection Time: 03/26/21  8:21 AM  Result Value Ref Range   Glucose-Capillary 181 (H) 70 - 99 mg/dL    Comment: Glucose reference range applies only to samples taken after fasting for at least 8 hours.    ECG   Sinus tachycardia with lateral T wave inversions (4/25) - Personally Reviewed  Telemetry   Sinus rhythm, T waves improved- Personally Reviewed  Radiology    DG Chest 2 View  Result Date: 03/25/2021 CLINICAL DATA:  50 year old female with chest pain. EXAM: CHEST - 2 VIEW COMPARISON:  None. FINDINGS: The heart size and mediastinal contours are within normal limits. Both lungs are clear. The visualized skeletal structures are unremarkable. IMPRESSION: No active cardiopulmonary disease. Electronically Signed  By: Elgie Collard  M.D.   On: 03/25/2021 17:53    Cardiac Studies   Echo pending  Assessment   Principal Problem:   Chest pain, rule out acute myocardial infarction Active Problems:   Diabetes mellitus without complication (HCC)   Hyperlipidemia   Hypertension   Plan   1. Hypertensive urgency - blood pressure remains poorly controlled, I suspect this is the etiology of her symptoms. Troponins are mildly flat elevated, not consistent with ACS. Echo is reassuring, however, LVEDP is elevated. Needs better BP control. Currently on losartan 50 mg daily (had cough with lisinopril). Will add amlodipine 5 mg daily to her regimen starting today. May also benefit from the addition of diuretic. 2. Chest pain - this has resolved. Troponin flat elevated, more consistent with hypertensive urgency. History of cath in the past with mild abnormalities and 2 prior negative stress tests, last in 2018. No family history of premature CAD. Ok to resume diet. No plans for ischemic eval this admission. 3. DM2 - per medicine service. Continue insulin - ? Adding SGLT2 or GLP-1 agent (could not take metformin). Needs repeat A1c, I have ordered for tomorrow am - her BG was 300, A1c in 10/2020 was 11. 4. HLD - on atorvastatin 40 mg daily- repeat FLP in am tomorrow.  Time Spent Directly with Patient:  I have spent a total of 25 minutes with the patient reviewing hospital notes, telemetry, EKGs, labs and examining the patient as well as establishing an assessment and plan that was discussed personally with the patient.  > 50% of time was spent in direct patient care.  Length of Stay:  LOS: 0 days   Chrystie Nose, MD, [facility], FACP  [place]  [facility]  Medical Director of the [facility]  Card[facility]ovascular Risk Reduction Clinic Diplomate of the American Board of Clinical Lipidology Attending Cardiologist  Direct Dial: [phone]  Fax: [phone]  Website:  [email or web address]  Marysa Wessner 03/26/2021, 10:51 AM

## 2021-03-26 NOTE — Progress Notes (Signed)
D/C instructions given and reviewed. Tele and IV removed, tolerated well. Notified NT that pt is ready to transport to private vehicle.

## 2021-03-26 NOTE — Plan of Care (Signed)

## 2021-03-26 NOTE — Progress Notes (Signed)
   03/26/21 0114  Assess: MEWS Score  Temp 98.2 F (36.8 C)  BP (!) 207/90  Pulse Rate 69  Resp 20  SpO2 95 %  Assess: MEWS Score  MEWS Temp 0  MEWS Systolic 2  MEWS Pulse 0  MEWS RR 0  MEWS LOC 0  MEWS Score 2  MEWS Score Color Yellow  Treat  Pain Scale 0-10  Pain Score 6  Pain Type Acute pain  Pain Location Chest  Pain Orientation Medial  Pain Radiating Towards Arms  Pain Descriptors / Indicators Stabbing;Sharp  Pain Frequency Occasional  Notify: Charge Nurse/RN  Name of Charge Nurse/RN Notified [place]  Date Charge Nurse/RN Notified 03/26/21  Time Charge Nurse/RN Notified 0149  Notify: Provider  Provider Name/Title Ye/Shalboub  Date Provider Notified 03/26/21  Time Provider Notified 0151  Notification Type Page  Notification Reason Other (Comment) (Elevated BP and yellow MEWS)  Provider response Other (Comment) (Still awaiting)

## 2021-03-27 ENCOUNTER — Encounter: Payer: Self-pay | Admitting: Family Medicine

## 2021-03-31 ENCOUNTER — Other Ambulatory Visit: Payer: Self-pay

## 2021-03-31 ENCOUNTER — Emergency Department (HOSPITAL_COMMUNITY): Payer: BC Managed Care – PPO

## 2021-03-31 ENCOUNTER — Inpatient Hospital Stay (HOSPITAL_COMMUNITY)
Admission: EM | Admit: 2021-03-31 | Discharge: 2021-04-02 | DRG: 247 | Disposition: A | Discharge status: Home / Self Care | Payer: BC Managed Care – PPO | Attending: Internal Medicine | Admitting: Internal Medicine

## 2021-03-31 ENCOUNTER — Encounter (HOSPITAL_COMMUNITY): Payer: Self-pay | Admitting: Emergency Medicine

## 2021-03-31 DIAGNOSIS — I10 Essential (primary) hypertension: Secondary | ICD-10-CM | POA: Diagnosis not present

## 2021-03-31 DIAGNOSIS — Z955 Presence of coronary angioplasty implant and graft: Secondary | ICD-10-CM

## 2021-03-31 DIAGNOSIS — Z825 Family history of asthma and other chronic lower respiratory diseases: Secondary | ICD-10-CM

## 2021-03-31 DIAGNOSIS — D72829 Elevated white blood cell count, unspecified: Secondary | ICD-10-CM | POA: Diagnosis not present

## 2021-03-31 DIAGNOSIS — I2510 Atherosclerotic heart disease of native coronary artery without angina pectoris: Secondary | ICD-10-CM | POA: Diagnosis not present

## 2021-03-31 DIAGNOSIS — Z880 Allergy status to penicillin: Secondary | ICD-10-CM | POA: Diagnosis not present

## 2021-03-31 DIAGNOSIS — I249 Acute ischemic heart disease, unspecified: Secondary | ICD-10-CM | POA: Diagnosis not present

## 2021-03-31 DIAGNOSIS — I2584 Coronary atherosclerosis due to calcified coronary lesion: Secondary | ICD-10-CM | POA: Diagnosis not present

## 2021-03-31 DIAGNOSIS — I259 Chronic ischemic heart disease, unspecified: Secondary | ICD-10-CM

## 2021-03-31 DIAGNOSIS — E871 Hypo-osmolality and hyponatremia: Secondary | ICD-10-CM | POA: Diagnosis present

## 2021-03-31 DIAGNOSIS — Z20822 Contact with and (suspected) exposure to covid-19: Secondary | ICD-10-CM | POA: Diagnosis present

## 2021-03-31 DIAGNOSIS — F4321 Adjustment disorder with depressed mood: Secondary | ICD-10-CM | POA: Diagnosis present

## 2021-03-31 DIAGNOSIS — E669 Obesity, unspecified: Secondary | ICD-10-CM | POA: Diagnosis present

## 2021-03-31 DIAGNOSIS — T380X5A Adverse effect of glucocorticoids and synthetic analogues, initial encounter: Secondary | ICD-10-CM | POA: Diagnosis not present

## 2021-03-31 DIAGNOSIS — N809 Endometriosis, unspecified: Secondary | ICD-10-CM | POA: Diagnosis present

## 2021-03-31 DIAGNOSIS — I252 Old myocardial infarction: Secondary | ICD-10-CM | POA: Diagnosis present

## 2021-03-31 DIAGNOSIS — Z9851 Tubal ligation status: Secondary | ICD-10-CM | POA: Diagnosis not present

## 2021-03-31 DIAGNOSIS — I214 Non-ST elevation (NSTEMI) myocardial infarction: Secondary | ICD-10-CM | POA: Diagnosis not present

## 2021-03-31 DIAGNOSIS — E1165 Type 2 diabetes mellitus with hyperglycemia: Secondary | ICD-10-CM | POA: Diagnosis present

## 2021-03-31 DIAGNOSIS — I255 Ischemic cardiomyopathy: Secondary | ICD-10-CM | POA: Diagnosis not present

## 2021-03-31 DIAGNOSIS — Z91041 Radiographic dye allergy status: Secondary | ICD-10-CM | POA: Diagnosis not present

## 2021-03-31 DIAGNOSIS — R112 Nausea with vomiting, unspecified: Secondary | ICD-10-CM | POA: Diagnosis not present

## 2021-03-31 DIAGNOSIS — E785 Hyperlipidemia, unspecified: Secondary | ICD-10-CM | POA: Diagnosis not present

## 2021-03-31 DIAGNOSIS — Z83438 Family history of other disorder of lipoprotein metabolism and other lipidemia: Secondary | ICD-10-CM

## 2021-03-31 DIAGNOSIS — E119 Type 2 diabetes mellitus without complications: Secondary | ICD-10-CM | POA: Diagnosis not present

## 2021-03-31 DIAGNOSIS — IMO0002 Reserved for concepts with insufficient information to code with codable children: Secondary | ICD-10-CM

## 2021-03-31 DIAGNOSIS — R0683 Snoring: Secondary | ICD-10-CM | POA: Diagnosis present

## 2021-03-31 DIAGNOSIS — Z886 Allergy status to analgesic agent status: Secondary | ICD-10-CM | POA: Diagnosis not present

## 2021-03-31 DIAGNOSIS — K589 Irritable bowel syndrome without diarrhea: Secondary | ICD-10-CM | POA: Diagnosis not present

## 2021-03-31 DIAGNOSIS — R7989 Other specified abnormal findings of blood chemistry: Secondary | ICD-10-CM

## 2021-03-31 DIAGNOSIS — Z888 Allergy status to other drugs, medicaments and biological substances status: Secondary | ICD-10-CM | POA: Diagnosis not present

## 2021-03-31 DIAGNOSIS — Z720 Tobacco use: Secondary | ICD-10-CM

## 2021-03-31 DIAGNOSIS — I25118 Atherosclerotic heart disease of native coronary artery with other forms of angina pectoris: Secondary | ICD-10-CM | POA: Diagnosis present

## 2021-03-31 DIAGNOSIS — R946 Abnormal results of thyroid function studies: Secondary | ICD-10-CM | POA: Diagnosis present

## 2021-03-31 DIAGNOSIS — R079 Chest pain, unspecified: Secondary | ICD-10-CM | POA: Diagnosis not present

## 2021-03-31 DIAGNOSIS — A6009 Herpesviral infection of other urogenital tract: Secondary | ICD-10-CM | POA: Diagnosis present

## 2021-03-31 DIAGNOSIS — Z6831 Body mass index (BMI) 31.0-31.9, adult: Secondary | ICD-10-CM

## 2021-03-31 DIAGNOSIS — Z881 Allergy status to other antibiotic agents status: Secondary | ICD-10-CM

## 2021-03-31 DIAGNOSIS — I25110 Atherosclerotic heart disease of native coronary artery with unstable angina pectoris: Secondary | ICD-10-CM | POA: Diagnosis not present

## 2021-03-31 DIAGNOSIS — Z8249 Family history of ischemic heart disease and other diseases of the circulatory system: Secondary | ICD-10-CM | POA: Diagnosis not present

## 2021-03-31 DIAGNOSIS — F17210 Nicotine dependence, cigarettes, uncomplicated: Secondary | ICD-10-CM | POA: Diagnosis present

## 2021-03-31 DIAGNOSIS — Z833 Family history of diabetes mellitus: Secondary | ICD-10-CM

## 2021-03-31 DIAGNOSIS — Z9109 Other allergy status, other than to drugs and biological substances: Secondary | ICD-10-CM

## 2021-03-31 DIAGNOSIS — Z79899 Other long term (current) drug therapy: Secondary | ICD-10-CM

## 2021-03-31 DIAGNOSIS — I429 Cardiomyopathy, unspecified: Secondary | ICD-10-CM | POA: Diagnosis present

## 2021-03-31 DIAGNOSIS — Z794 Long term (current) use of insulin: Secondary | ICD-10-CM

## 2021-03-31 DIAGNOSIS — I219 Acute myocardial infarction, unspecified: Secondary | ICD-10-CM

## 2021-03-31 HISTORY — DX: Ischemic cardiomyopathy: I25.5

## 2021-03-31 HISTORY — DX: Atherosclerotic heart disease of native coronary artery without angina pectoris: I25.10

## 2021-03-31 HISTORY — DX: Acute myocardial infarction, unspecified: I21.9

## 2021-03-31 LAB — BASIC METABOLIC PANEL
Anion gap: 10 (ref 5–15)
BUN: 10 mg/dL (ref 6–20)
CO2: 24 mmol/L (ref 22–32)
Calcium: 9.8 mg/dL (ref 8.9–10.3)
Chloride: 99 mmol/L (ref 98–111)
Creatinine, Ser: 0.69 mg/dL (ref 0.44–1.00)
GFR, Estimated: >60 mL/min (ref >60)
Glucose, Bld: 222 mg/dL — ABNORMAL HIGH (ref 70–99)
Potassium: 3.9 mmol/L (ref 3.5–5.1)
Sodium: 133 mmol/L — ABNORMAL LOW (ref 135–145)

## 2021-03-31 LAB — MAGNESIUM: Magnesium: 1.7 mg/dL (ref 1.7–2.4)

## 2021-03-31 LAB — HEMOGLOBIN A1C
Hgb A1c MFr Bld: 9.5 % — ABNORMAL HIGH (ref 4.8–5.6)
Mean Plasma Glucose: 225.95 mg/dL

## 2021-03-31 LAB — RESP PANEL BY RT-PCR (FLU A&B, COVID) ARPGX2
Influenza A by PCR: NEGATIVE
Influenza B by PCR: NEGATIVE
SARS Coronavirus 2 by RT PCR: NEGATIVE

## 2021-03-31 LAB — LIPID PANEL
Cholesterol: 138 mg/dL (ref 0–200)
HDL: 35 mg/dL — ABNORMAL LOW (ref >40)
LDL Cholesterol: 89 mg/dL (ref 0–99)
Total CHOL/HDL Ratio: 3.9 RATIO
Triglycerides: 69 mg/dL (ref <150)
VLDL: 14 mg/dL (ref 0–40)

## 2021-03-31 LAB — CBC
HCT: 47.1 % — ABNORMAL HIGH (ref 36.0–46.0)
Hemoglobin: 15.7 g/dL — ABNORMAL HIGH (ref 12.0–15.0)
MCH: 31.9 pg (ref 26.0–34.0)
MCHC: 33.3 g/dL (ref 30.0–36.0)
MCV: 95.7 fL (ref 80.0–100.0)
Platelets: 367 10*3/uL (ref 150–400)
RBC: 4.92 MIL/uL (ref 3.87–5.11)
RDW: 12.5 % (ref 11.5–15.5)
WBC: 10 10*3/uL (ref 4.0–10.5)
nRBC: 0 % (ref 0.0–0.2)

## 2021-03-31 LAB — HEPARIN LEVEL (UNFRACTIONATED): Heparin Unfractionated: <0.1 IU/mL — ABNORMAL LOW (ref 0.30–0.70)

## 2021-03-31 LAB — TROPONIN I (HIGH SENSITIVITY)
Troponin I (High Sensitivity): 500 ng/L (ref <18)
Troponin I (High Sensitivity): 799 ng/L (ref <18)
Troponin I (High Sensitivity): 1057 ng/L (ref <18)

## 2021-03-31 LAB — I-STAT BETA HCG BLOOD, ED (MC, WL, AP ONLY): I-stat hCG, quantitative: <5 m[IU]/mL (ref <5)

## 2021-03-31 LAB — APTT: aPTT: 35 seconds (ref 24–36)

## 2021-03-31 LAB — GLUCOSE, CAPILLARY
Glucose-Capillary: 224 mg/dL — ABNORMAL HIGH (ref 70–99)
Glucose-Capillary: 241 mg/dL — ABNORMAL HIGH (ref 70–99)

## 2021-03-31 LAB — PROTIME-INR
INR: 0.9 (ref 0.8–1.2)
Prothrombin Time: 12.4 seconds (ref 11.4–15.2)

## 2021-03-31 MED ORDER — SODIUM CHLORIDE 0.9 % WEIGHT BASED INFUSION
3.0000 mL/kg/h | INTRAVENOUS | Status: DC
  Administered 2021-04-01: 3 mL/kg/h via INTRAVENOUS

## 2021-03-31 MED ORDER — PREDNISONE 20 MG PO TABS
50.0000 mg | ORAL_TABLET | Freq: Four times a day (QID) | ORAL | Status: AC
  Administered 2021-03-31 – 2021-04-01 (×3): 50 mg via ORAL
  Filled 2021-03-31 (×3): qty 1

## 2021-03-31 MED ORDER — SODIUM CHLORIDE 0.9 % WEIGHT BASED INFUSION
1.0000 mL/kg/h | INTRAVENOUS | Status: DC
  Administered 2021-04-01: 1 mL/kg/h via INTRAVENOUS

## 2021-03-31 MED ORDER — NITROGLYCERIN IN D5W 200-5 MCG/ML-% IV SOLN
0.0000 ug/min | INTRAVENOUS | Status: DC
  Administered 2021-03-31: 5 ug/min via INTRAVENOUS
  Filled 2021-03-31: qty 250

## 2021-03-31 MED ORDER — ASPIRIN 81 MG PO CHEW
81.0000 mg | CHEWABLE_TABLET | ORAL | Status: AC
  Administered 2021-04-01: 81 mg via ORAL
  Filled 2021-03-31: qty 1

## 2021-03-31 MED ORDER — ASPIRIN EC 81 MG PO TBEC
81.0000 mg | DELAYED_RELEASE_TABLET | Freq: Every day | ORAL | Status: DC
  Administered 2021-04-01 – 2021-04-02 (×2): 81 mg via ORAL
  Filled 2021-03-31 (×2): qty 1

## 2021-03-31 MED ORDER — ONDANSETRON HCL 4 MG/2ML IJ SOLN
4.0000 mg | Freq: Once | INTRAMUSCULAR | Status: AC
  Administered 2021-03-31: 4 mg via INTRAVENOUS
  Filled 2021-03-31: qty 2

## 2021-03-31 MED ORDER — NITROGLYCERIN 0.4 MG SL SUBL: 0.4000 mg | SUBLINGUAL_TABLET | SUBLINGUAL | Status: DC | PRN

## 2021-03-31 MED ORDER — INSULIN ASPART 100 UNIT/ML IJ SOLN
0.0000 [IU] | Freq: Every day | INTRAMUSCULAR | Status: DC
  Administered 2021-03-31: 2 [IU] via SUBCUTANEOUS
  Administered 2021-04-01: 5 [IU] via SUBCUTANEOUS

## 2021-03-31 MED ORDER — INSULIN ASPART 100 UNIT/ML IJ SOLN
0.0000 [IU] | Freq: Three times a day (TID) | INTRAMUSCULAR | Status: DC
  Administered 2021-03-31: 3 [IU] via SUBCUTANEOUS
  Administered 2021-04-01: 7 [IU] via SUBCUTANEOUS
  Administered 2021-04-01: 9 [IU] via SUBCUTANEOUS
  Administered 2021-04-02: 3 [IU] via SUBCUTANEOUS
  Administered 2021-04-02: 5 [IU] via SUBCUTANEOUS

## 2021-03-31 MED ORDER — SODIUM CHLORIDE 0.9% FLUSH: 3.0000 mL | INTRAVENOUS | Status: DC | PRN

## 2021-03-31 MED ORDER — SODIUM CHLORIDE 0.9% FLUSH
3.0000 mL | Freq: Two times a day (BID) | INTRAVENOUS | Status: DC
  Administered 2021-04-02: 3 mL via INTRAVENOUS

## 2021-03-31 MED ORDER — ACETAMINOPHEN 325 MG PO TABS: 650.0000 mg | ORAL_TABLET | ORAL | Status: DC | PRN

## 2021-03-31 MED ORDER — DIPHENHYDRAMINE HCL 50 MG/ML IJ SOLN
50.0000 mg | Freq: Once | INTRAMUSCULAR | Status: AC
  Administered 2021-04-01: 50 mg via INTRAVENOUS
  Filled 2021-03-31: qty 1

## 2021-03-31 MED ORDER — VALACYCLOVIR HCL 500 MG PO TABS
500.0000 mg | ORAL_TABLET | Freq: Two times a day (BID) | ORAL | Status: DC
  Administered 2021-03-31 – 2021-04-02 (×4): 500 mg via ORAL
  Filled 2021-03-31 (×5): qty 1

## 2021-03-31 MED ORDER — HEPARIN SODIUM (PORCINE) 5000 UNIT/ML IJ SOLN
60.0000 [IU]/kg | Freq: Once | INTRAMUSCULAR | Status: DC
  Filled 2021-03-31: qty 2

## 2021-03-31 MED ORDER — HYDROMORPHONE HCL 1 MG/ML IJ SOLN
0.5000 mg | Freq: Once | INTRAMUSCULAR | Status: AC
Start: 2021-03-31 — End: 2021-03-31
  Administered 2021-03-31: 0.5 mg via INTRAVENOUS
  Filled 2021-03-31: qty 1

## 2021-03-31 MED ORDER — METOPROLOL TARTRATE 25 MG PO TABS: 12.5000 mg | ORAL_TABLET | Freq: Two times a day (BID) | ORAL | Status: DC

## 2021-03-31 MED ORDER — LOSARTAN POTASSIUM 50 MG PO TABS
50.0000 mg | ORAL_TABLET | Freq: Every day | ORAL | Status: DC
  Administered 2021-04-01 – 2021-04-02 (×2): 50 mg via ORAL
  Filled 2021-03-31 (×2): qty 1

## 2021-03-31 MED ORDER — ONDANSETRON HCL 4 MG/2ML IJ SOLN: 4.0000 mg | Freq: Four times a day (QID) | INTRAMUSCULAR | Status: DC | PRN

## 2021-03-31 MED ORDER — METOPROLOL TARTRATE 25 MG PO TABS
25.0000 mg | ORAL_TABLET | Freq: Two times a day (BID) | ORAL | Status: DC
  Administered 2021-03-31 – 2021-04-01 (×3): 25 mg via ORAL
  Filled 2021-03-31 (×4): qty 1

## 2021-03-31 MED ORDER — SODIUM CHLORIDE 0.9 % IV SOLN: INTRAVENOUS | Status: DC

## 2021-03-31 MED ORDER — DIPHENHYDRAMINE HCL 25 MG PO CAPS: 50.0000 mg | ORAL_CAPSULE | Freq: Once | ORAL | Status: AC

## 2021-03-31 MED ORDER — HEPARIN BOLUS VIA INFUSION
3000.0000 [IU] | Freq: Once | INTRAVENOUS | Status: AC
  Administered 2021-03-31: 3000 [IU] via INTRAVENOUS
  Filled 2021-03-31: qty 3000

## 2021-03-31 MED ORDER — HEPARIN (PORCINE) 25000 UT/250ML-% IV SOLN
1300.0000 [IU]/h | INTRAVENOUS | Status: DC
  Administered 2021-03-31: 1000 [IU]/h via INTRAVENOUS
  Filled 2021-03-31: qty 250

## 2021-03-31 MED ORDER — ASPIRIN 81 MG PO CHEW
324.0000 mg | CHEWABLE_TABLET | Freq: Once | ORAL | Status: AC
  Administered 2021-03-31: 324 mg via ORAL
  Filled 2021-03-31: qty 4

## 2021-03-31 MED ORDER — HEPARIN BOLUS VIA INFUSION
4000.0000 [IU] | Freq: Once | INTRAVENOUS | Status: AC
  Administered 2021-03-31: 4000 [IU] via INTRAVENOUS
  Filled 2021-03-31: qty 4000

## 2021-03-31 MED ORDER — AMLODIPINE BESYLATE 5 MG PO TABS
5.0000 mg | ORAL_TABLET | Freq: Every day | ORAL | Status: DC
  Administered 2021-04-01: 5 mg via ORAL
  Filled 2021-03-31: qty 1

## 2021-03-31 MED ORDER — ATORVASTATIN CALCIUM 40 MG PO TABS
40.0000 mg | ORAL_TABLET | Freq: Every day | ORAL | Status: DC
  Administered 2021-03-31: 40 mg via ORAL
  Filled 2021-03-31: qty 1

## 2021-03-31 MED ORDER — SODIUM CHLORIDE 0.9 % IV SOLN: 250.0000 mL | INTRAVENOUS | Status: DC | PRN

## 2021-03-31 NOTE — Progress Notes (Signed)
ANTICOAGULATION CONSULT NOTE - Initial Consult  Pharmacy Consult for heparin Indication: chest pain/ACS   Labs: Recent Labs    03/31/21 1240 03/31/21 1425 03/31/21 1730 03/31/21 2239  HGB 15.7*  --   --   --   HCT 47.1*  --   --   --   PLT 367  --   --   --   APTT 35  --   --   --   LABPROT 12.4  --   --   --   INR 0.9  --   --   --   HEPARINUNFRC  --   --   --  <0.10*  CREATININE 0.69  --   --   --   TROPONINIHS 500* 799* 1,057*  --     Assessment: 49 yof presented to the ED with CP. Troponin elevated and now starting IV heparin for treatment of NSTEMI. CBC is ok and she is not on anticoagulation PTA. Initial heparin level <0.1 units/hr.  No issues noted  Goal of Therapy:  Heparin level 0.3-0.7 units/ml Monitor platelets by anticoagulation protocol: Yes   Plan:  Heparin bolus 3000 units IV x 1 Increase heparin gtt 1300 units/hr Check a 6 hr heparin level Daily heparin level and CBC  Katty Fretwell Poteet 03/31/2021,11:35 PM

## 2021-03-31 NOTE — ED Provider Notes (Signed)
[facility] EMERGENCY DEPARTMENT Provider Note   CSN: [id number] Arrival date & time: 03/31/21  1212     History Chief Complaint  Patient presents with  . Chest Pain    Bonnie Dickson is a 50 y.o. female.  Patient with hx ht, dm, presents with 'chest pain for past week'. States symptoms acute onset ~ 1 week ago, moderate, states more or less constant in that it never goes away for long periods, but episodically is worse with certain activities, exertion or eating.  Pain dull or heavy (not sharp, ripping, or tearing), mid chest, occasionally radiates to left jaw (no back or abd pain). Associated with nausea, has had a couple episodes or emesis (not bloody or bilious) and sob. No diaphoresis. Pain is not pleuritic. No leg pain or swelling. No hx dvt or pe. Denies hx cad, reports normal cath 2013 in [facility] area. Denies chest wall injury or strain. No heartburn. Denies recent trauma, surgery, prolonged travel or immobility. Rare non prod cough, no sore throat or other uri symptoms. No fever or chills. Was recently admitted for same - had echo and several troponins then.   The history is provided by the patient.  Chest Pain Associated symptoms: cough, nausea, shortness of breath and vomiting   Associated symptoms: no abdominal pain, no back pain, no fever, no headache and no palpitations        Past Medical History:  Diagnosis Date  . Depression    situational  . Diabetes mellitus without complication (HCC)    diet controlled  . Endometriosis   . History of chicken pox   . HSV-2 (herpes simplex virus 2) infection   . Hyperglycemia 07/24/2019  . Hyperlipidemia   . Hypertension   . Insomnia   . Snoring   . Tonsillitis and adenoiditis, chronic    childhood, caused adult snoring    Patient Active Problem List   Diagnosis Date Noted  . Chest pain, rule out acute myocardial infarction 03/25/2021  . Vaginitis 11/20/2020  . Preventative health care 10/19/2019   . Hypertension 07/25/2019  . Diabetes mellitus without complication (HCC)   . Hyperlipidemia   . HSV-2 (herpes simplex virus 2) infection   . Insomnia   . Endometriosis   . History of IBS 07/18/2019  . Snoring   . History of chicken pox   . Depression     Past Surgical History:  Procedure Laterality Date  . ABDOMINAL HYSTERECTOMY     ovaries left in place  . BREAST CYST EXCISION Right    35 years ago   . COMBINED ABDOMINOPLASTY AND LIPOSUCTION    . ENDOMETRIAL ABLATION    . ENDOMETRIAL BIOPSY    . LAPAROSCOPIC ENDOMETRIOSIS FULGURATION    . PARTIAL HYSTERECTOMY     vaginal  . TUBAL LIGATION       OB History   No obstetric history on file.     Family History  Problem Relation Age of Onset  . Hypertension Mother   . Diabetes Mother        prediabetes  . Hyperlipidemia Mother   . Diabetes Father   . Cataracts Father   . Eczema Brother   . Asthma Brother   . Diabetes Maternal Grandmother   . Hypertension Maternal Grandmother   . Diabetes Maternal Grandfather   . Hypertension Maternal Grandfather   . Hyperlipidemia Maternal Grandfather   . Heart failure Maternal Grandfather   . COPD Maternal Grandfather   . ODD Son   .  ADD / ADHD Son   . Diabetes Daughter   . Hypertension Daughter   . Colon cancer Neg Hx   . Stomach cancer Neg Hx   . Pancreatic cancer Neg Hx   . Esophageal cancer Neg Hx     Social History   Tobacco Use  . Smoking status: Current Every Day Smoker    Packs/day: 0.50    Years: 30.00    Pack years: 15.00    Types: Cigarettes  . Smokeless tobacco: Never Used  Vaping Use  . Vaping Use: Never used  Substance Use Topics  . Alcohol use: Never  . Drug use: Never    Home Medications Prior to Admission medications   Medication Sig Start Date End Date Taking? Authorizing Provider  amLODipine (NORVASC) 5 MG tablet Take 1 tablet (5 mg total) by mouth daily. 03/27/21   Azucena FallenLancaster, William C, MD  atorvastatin (LIPITOR) 40 MG tablet Take 1  tablet by mouth once daily Patient taking differently: Take 40 mg by mouth daily. 02/07/21   Bradd CanaryBlyth, Stacey A, MD  insulin regular (NOVOLIN R) 100 units/mL injection Inject 2-5 Units into the skin 3 (three) times daily before meals.    [provider]  losartan (COZAAR) 50 MG tablet Take 1 tablet by mouth once daily 02/07/21   Bradd CanaryBlyth, Stacey A, MD  valACYclovir (VALTREX) 500 MG tablet Take 1 tablet (500 mg total) by mouth 2 (two) times daily. 11/27/20   Bradd CanaryBlyth, Stacey A, MD    Allergies    Ciprofloxacin hcl, Gentamicin, Penicillins, Ambien [zolpidem tartrate], Asa [aspirin], Ivp dye [iodinated diagnostic agents], Lisinopril, Metformin and related, Nsaids, Codeine, Flagyl [metronidazole], and Morphine and related  Review of Systems   Review of Systems  Constitutional: Negative for fever.  HENT: Negative for sore throat.   Eyes: Negative for redness.  Respiratory: Positive for cough and shortness of breath.        Sob w chest pain, otherwise no increased sob at baseline  Cardiovascular: Positive for chest pain. Negative for palpitations and leg swelling.  Gastrointestinal: Positive for nausea and vomiting. Negative for abdominal pain.  Genitourinary: Negative for dysuria and flank pain.  Musculoskeletal: Negative for back pain and neck pain.  Skin: Negative for rash.  Neurological: Negative for headaches.  Hematological: Does not bruise/bleed easily.  Psychiatric/Behavioral: Negative for confusion.    Physical Exam Updated Vital Signs BP (!) 137/92 (BP Location: Left Arm)   Pulse 99   Temp 98.3 F (36.8 C) (Oral)   Resp 16   SpO2 100%   Physical Exam Vitals and nursing note reviewed.  Constitutional:      Appearance: Normal appearance. She is well-developed.  HENT:     Head: Atraumatic.     Nose: Nose normal.     Mouth/Throat:     Mouth: Mucous membranes are moist.  Eyes:     General: No scleral icterus.    Conjunctiva/sclera: Conjunctivae normal.  Neck:      Trachea: No tracheal deviation.  Cardiovascular:     Rate and Rhythm: Normal rate and regular rhythm.     Pulses: Normal pulses.     Heart sounds: Normal heart sounds. No murmur heard. No friction rub. No gallop.   Pulmonary:     Effort: Pulmonary effort is normal. No respiratory distress.     Breath sounds: Normal breath sounds.  Chest:     Chest wall: No tenderness.  Abdominal:     General: Bowel sounds are normal. There is no distension.  Palpations: Abdomen is soft.     Tenderness: There is no abdominal tenderness.  Genitourinary:    Comments: No cva tenderness.  Musculoskeletal:        General: No swelling or tenderness.     Cervical back: Normal range of motion and neck supple. No rigidity. No muscular tenderness.     Right lower leg: No edema.     Left lower leg: No edema.  Skin:    General: Skin is warm and dry.     Findings: No rash.  Neurological:     Mental Status: She is alert.     Comments: Alert, speech normal.   Psychiatric:        Mood and Affect: Mood normal.     ED Results / Procedures / Treatments   Labs (all labs ordered are listed, but only abnormal results are displayed) Results for orders placed or performed during the hospital encounter of 03/31/21  Basic metabolic panel  Result Value Ref Range   Sodium 133 (L) 135 - 145 mmol/L   Potassium 3.9 3.5 - 5.1 mmol/L   Chloride 99 98 - 111 mmol/L   CO2 24 22 - 32 mmol/L   Glucose, Bld 222 (H) 70 - 99 mg/dL   BUN 10 6 - 20 mg/dL   Creatinine, Ser 3.53 0.44 - 1.00 mg/dL   Calcium 9.8 8.9 - 29.9 mg/dL   GFR, Estimated >24 >26 mL/min   Anion gap 10 5 - 15  CBC  Result Value Ref Range   WBC 10.0 4.0 - 10.5 K/uL   RBC 4.92 3.87 - 5.11 MIL/uL   Hemoglobin 15.7 (H) 12.0 - 15.0 g/dL   HCT 83.4 (H) 19.6 - 22.2 %   MCV 95.7 80.0 - 100.0 fL   MCH 31.9 26.0 - 34.0 pg   MCHC 33.3 30.0 - 36.0 g/dL   RDW 97.9 89.2 - 11.9 %   Platelets 367 150 - 400 K/uL   nRBC 0.0 0.0 - 0.2 %  I-Stat beta hCG blood, ED   Result Value Ref Range   I-stat hCG, quantitative <5.0 <5 mIU/mL   Comment 3          Troponin I (High Sensitivity)  Result Value Ref Range   Troponin I (High Sensitivity) 500 (HH) <18 ng/L   DG Chest 2 View  Result Date: 03/31/2021 CLINICAL DATA:  Chest pain, shortness breath, nausea and vomiting for 8 days. EXAM: CHEST - 2 VIEW COMPARISON:  Chest x-ray dated 03/25/2021. FINDINGS: Heart size and mediastinal contours are within normal limits. Lungs are clear. No pleural effusion or pneumothorax is seen. Osseous structures about the chest are unremarkable. IMPRESSION: No active cardiopulmonary disease.  No evidence of pneumonia. Electronically Signed   By: Bary Richard M.D.   On: 03/31/2021 13:54   DG Chest 2 View  Result Date: 03/25/2021 CLINICAL DATA:  50 year old female with chest pain. EXAM: CHEST - 2 VIEW COMPARISON:  None. FINDINGS: The heart size and mediastinal contours are within normal limits. Both lungs are clear. The visualized skeletal structures are unremarkable. IMPRESSION: No active cardiopulmonary disease. Electronically Signed   By: Elgie Collard M.D.   On: 03/25/2021 17:53   ECHOCARDIOGRAM COMPLETE  Result Date: 03/26/2021    ECHOCARDIOGRAM REPORT   Patient Name:   RYNLEE LISBON Date of Exam: 03/26/2021 Medical Rec #:  [id number]        Height:       67.0 in Accession #:    [id number]  Weight:       203.3 lb Date of Birth:  [birth year]        BSA:          2.036 m Patient Age:    49 years         BP:           163/80 mmHg Patient Gender: F                HR:           62 bpm. Exam Location:  Inpatient Procedure: 2D Echo, Cardiac Doppler and Color Doppler Indications:    Chest pain  History:        Patient has no prior history of Echocardiogram examinations.                 Risk Factors:Hypertension, Dyslipidemia and Diabetes.  Sonographer:    Gertie Fey MHA, RDMS, RVT, RDCS Referring Phys: [id number] Cecille Po [facility]  Sonographer Comments: Image acquisition  challenging due to patient body habitus. IMPRESSIONS  1. Left ventricular ejection fraction, by estimation, is 60 to 65%. The left ventricle has normal function. The left ventricle has no regional wall motion abnormalities. Left ventricular diastolic parameters are consistent with Grade II diastolic dysfunction (pseudonormalization). Elevated left atrial pressure.  2. Right ventricular systolic function is normal. The right ventricular size is normal. There is normal pulmonary artery systolic pressure.  3. Left atrial size was mildly dilated.  4. The mitral valve is normal in structure. Trivial mitral valve regurgitation. No evidence of mitral stenosis.  5. The aortic valve is normal in structure. Aortic valve regurgitation is not visualized. No aortic stenosis is present.  6. The inferior vena cava is normal in size with greater than 50% respiratory variability, suggesting right atrial pressure of 3 mmHg. FINDINGS  Left Ventricle: Left ventricular ejection fraction, by estimation, is 60 to 65%. The left ventricle has normal function. The left ventricle has no regional wall motion abnormalities. The left ventricular internal cavity size was normal in size. There is  no left ventricular hypertrophy. Left ventricular diastolic parameters are consistent with Grade II diastolic dysfunction (pseudonormalization). Elevated left atrial pressure. Right Ventricle: The right ventricular size is normal. No increase in right ventricular wall thickness. Right ventricular systolic function is normal. There is normal pulmonary artery systolic pressure. The tricuspid regurgitant velocity is 1.33 m/s, and  with an assumed right atrial pressure of 3 mmHg, the estimated right ventricular systolic pressure is 10.1 mmHg. Left Atrium: Left atrial size was mildly dilated. Right Atrium: Right atrial size was normal in size. Pericardium: There is no evidence of pericardial effusion. Mitral Valve: The mitral valve is normal in structure.  Trivial mitral valve regurgitation. No evidence of mitral valve stenosis. Tricuspid Valve: The tricuspid valve is normal in structure. Tricuspid valve regurgitation is not demonstrated. No evidence of tricuspid stenosis. Aortic Valve: The aortic valve is normal in structure. Aortic valve regurgitation is not visualized. No aortic stenosis is present. Aortic valve mean gradient measures 5.0 mmHg. Aortic valve peak gradient measures 8.9 mmHg. Aortic valve area, by VTI measures 2.14 cm. Pulmonic Valve: The pulmonic valve was normal in structure. Pulmonic valve regurgitation is not visualized. No evidence of pulmonic stenosis. Aorta: The aortic root is normal in size and structure. Venous: The inferior vena cava is normal in size with greater than 50% respiratory variability, suggesting right atrial pressure of 3 mmHg. IAS/Shunts: No atrial level shunt detected by color flow Doppler.  LEFT VENTRICLE  PLAX 2D LVIDd:         4.60 cm     Diastology LVIDs:         2.80 cm     LV e' medial:    5.98 cm/s LV PW:         1.00 cm     LV E/e' medial:  16.1 LV IVS:        0.80 cm     LV e' lateral:   5.98 cm/s LVOT diam:     1.90 cm     LV E/e' lateral: 16.1 LV SV:         62 LV SV Index:   30 LVOT Area:     2.84 cm  LV Volumes (MOD) LV vol d, MOD A2C: 69.0 ml LV vol d, MOD A4C: 72.7 ml LV vol s, MOD A2C: 24.6 ml LV vol s, MOD A4C: 30.6 ml LV SV MOD A2C:     44.4 ml LV SV MOD A4C:     72.7 ml LV SV MOD BP:      43.3 ml RIGHT VENTRICLE RV S prime:     11.90 cm/s TAPSE (M-mode): 2.4 cm LEFT ATRIUM             Index       RIGHT ATRIUM           Index LA diam:        4.11 cm 2.02 cm/m  RA Area:     12.90 cm LA Vol (A2C):   68.8 ml 33.79 ml/m RA Volume:   29.90 ml  14.68 ml/m LA Vol (A4C):   58.6 ml 28.78 ml/m LA Biplane Vol: 63.7 ml 31.28 ml/m  AORTIC VALVE AV Area (Vmax):    2.17 cm AV Area (Vmean):   2.31 cm AV Area (VTI):     2.14 cm AV Vmax:           149.00 cm/s AV Vmean:          102.000 cm/s AV VTI:            0.290 m  AV Peak Grad:      8.9 mmHg AV Mean Grad:      5.0 mmHg LVOT Vmax:         114.00 cm/s LVOT Vmean:        83.100 cm/s LVOT VTI:          0.219 m LVOT/AV VTI ratio: 0.76  AORTA Ao Root diam: 3.00 cm MITRAL VALVE               TRICUSPID VALVE MV Area (PHT): 2.42 cm    TR Peak grad:   7.1 mmHg MV Decel Time: 313 msec    TR Vmax:        133.00 cm/s MV E velocity: 96.00 cm/s MV A velocity: 93.00 cm/s  SHUNTS MV E/A ratio:  1.03        Systemic VTI:  0.22 m                            Systemic Diam: 1.90 cm Thurmon Fair MD Electronically signed by Thurmon Fair MD Signature Date/Time: 03/26/2021/11:02:39 AM    Final     EKG EKG Interpretation  Date/Time:  Sunday Mar 31 2021 12:18:30 EDT Ventricular Rate:  110 PR Interval:  160 QRS Duration: 78 QT Interval:  332 QTC Calculation: 449 R Axis:   54 Text Interpretation:  Sinus tachycardia  `?slight stelev v1-v2 and slight st dep inferior/lat compared to prior ecg Confirmed by Cathren Laine ([id number]) on 03/31/2021 2:34:18 PM   Radiology DG Chest 2 View  Result Date: 03/31/2021 CLINICAL DATA:  Chest pain, shortness breath, nausea and vomiting for 8 days. EXAM: CHEST - 2 VIEW COMPARISON:  Chest x-ray dated 03/25/2021. FINDINGS: Heart size and mediastinal contours are within normal limits. Lungs are clear. No pleural effusion or pneumothorax is seen. Osseous structures about the chest are unremarkable. IMPRESSION: No active cardiopulmonary disease.  No evidence of pneumonia. Electronically Signed   By: Bary Richard M.D.   On: 03/31/2021 13:54    Procedures Procedures   Medications Ordered in ED Medications - No data to display  ED Course  I have reviewed the triage vital signs and the nursing notes.  Pertinent labs & imaging results that were available during my care of the patient were reviewed by me and considered in my medical decision making (see chart for details).    MDM Rules/Calculators/A&P                          Iv ns. Continuous  pulse ox and cardiac monitoring. Stat labs and imaging.   Reviewed nursing notes and prior charts for additional history. Recent admission for chest pain.  Pt was in waiting room when trop resulted high. Triage RN moved pt into room. Upon entry into room, pt immediately checked by me, ECG repeated, asa/heparin, stemi doctor on call consulted w ?whether to activate cath lab (as initial ecg with slight st elev v1-v2 and sl st dep inf and v6) - Dr Herbie Baltimore reviewed current and repeat ecg (repeat ecg with new t inv v1-v4), and indicates not diagnostic of stemi, to call cardiology on call to admit.    Cardiology/Dr [employer] paged - patient, trop and ecgs discussed - he will see/admit.  ntg sl and gtt. Dilaudid .5 mg iv and zofran.   Labs reviewed/interpreted by me - trop v high.  CXR reviewed/interpreted by me - no pna.   Heparin per pharmacy.   CRITICAL CARE RE: ACS, emergent cardiology consultation Performed by: Suzi Roots Total critical care time: 40 minutes Critical care time was exclusive of separately billable procedures and treating other patients. Critical care was necessary to treat or prevent imminent or life-threatening deterioration. Critical care was time spent personally by me on the following activities: development of treatment plan with patient and/or surrogate as well as nursing, discussions with consultants, evaluation of patient's response to treatment, examination of patient, obtaining history from patient or surrogate, ordering and performing treatments and interventions, ordering and review of laboratory studies, ordering and review of radiographic studies, pulse oximetry and re-evaluation of patient's condition.    Final Clinical Impression(s) / ED Diagnoses Final diagnoses:  None    Rx / DC Orders ED Discharge Orders    None       Cathren Laine, MD 04/03/21 [phone]

## 2021-03-31 NOTE — ED Notes (Addendum)
Dr. Denton Lank notified of Trop 500.  Pt brought back to triage for repeat Trop and charge RN aware of need for treatment room.

## 2021-03-31 NOTE — H&P (Addendum)
Cardiology Consultation:   Patient ID: Bonnie Dickson MRN: [id number]; DOB: 02-18-1971  Admit date: 03/31/2021 Date of Consult: 03/31/2021  PCP:  Bradd Canary, MD   [facility]  Cardiologist:  Chrystie Nose, MD  Advanced Practice Provider:  No care team member to display Electrophysiologist:  None    Patient Profile:   Bonnie Dickson is a 50 y.o. female with a hx of obesity, hypertension, hyperlipidemia, type 2 diabetes, irritable bowel syndrome, who is being seen today for the evaluation of chest pain at the request of Dr. Denton Lank.   History of Present Illness:   Ms. Coffie self reports that she underwent left heart catheterization in 2013 in [facility] in [facility] revealing "some ischemic change" but no requiring stent placement.  She later had 2 normal nuclear stress test with most recently 1 reported 2018.  No event records can be found on care everywhere.  She is not currently seeing any cardiologist.  She visited ER on 03/25/2021 with chest pain and was admitted under hospital medicine service for ACS rule out and hypertensive urgency.  Patient was seen by cardiology Dr. Rennis Golden,  troponin minimally elevated and flat 23 >32 >29. EKG revealed nonspecific T wave inversion of lateral leads and ST depression of inferior leads.  Echocardiogram completed on 4/26 revealed EF 60 to 65%, no RWMA, grade 2 diastolic dysfunction, elevated left atrial pressure, RV normal in size and systolic function.  Mild dilated left atrium size, trivial MR. It was felt her chest pain is atypical in nature and troponin elevation likely due to hypertensive urgency.  She was continued on losartan 50 mg daily and added amlodipine 5 mg daily for blood pressure control.   Patient return to the ER today complaining recurrent chest pain. She states her pain has not resolved.  Over the past 8 days, she has been having mid-sternum chest pain intermittently. Pain is sharp, burning,  and aching in quality. Pain sometimes radiating to the left chest and arm. She noted at times pain is triggered by exertional activities. She reports associated SOB, dizziness, and had an episode of nausea, vomiting, fatigue yesterday. She note her pain is sometimes associated with meals. She does not take ASA at home due to hx of remote rectal bleeding. She states she had colonoscopy in the past which showed some "inflammation". She states her heart is important and is willing to take ASA if needed. She is still having chest pain at the time.   Diagnostic at ED revealed hyponatremia 133, hyperglycemia 222, otherwise BMP unremarkable.  Hs troponin 500 >799.  CBC with Hgb 15.7.  INR 0.9.  hCG negative.  Chest x-ray revealed no acute finding.  EKG shows sinus rhythm with ventricular rate no acute change compared to EKG from 03/25/21.  She is afebrile, AP 80-90s, RR 17-26, hypertensive with blood pressure 137/92-160/105, nonhypoxic at ED.  She was given 324mg  aspirin and started heparin infusion and nitro drip at ED. Cardiology is admitting the patient for non-STEMI.     Past Medical History:  Diagnosis Date  . Depression    situational  . Diabetes mellitus without complication (HCC)    diet controlled  . Endometriosis   . History of chicken pox   . HSV-2 (herpes simplex virus 2) infection   . Hyperglycemia 07/24/2019  . Hyperlipidemia   . Hypertension   . Insomnia   . Snoring   . Tonsillitis and adenoiditis, chronic    childhood, caused adult snoring  Past Surgical History:  Procedure Laterality Date  . ABDOMINAL HYSTERECTOMY     ovaries left in place  . BREAST CYST EXCISION Right    35 years ago   . COMBINED ABDOMINOPLASTY AND LIPOSUCTION    . ENDOMETRIAL ABLATION    . ENDOMETRIAL BIOPSY    . LAPAROSCOPIC ENDOMETRIOSIS FULGURATION    . PARTIAL HYSTERECTOMY     vaginal  . TUBAL LIGATION       Home Medications:  Prior to Admission medications   Medication Sig Start Date End Date  Taking? Authorizing Provider  amLODipine (NORVASC) 5 MG tablet Take 1 tablet (5 mg total) by mouth daily. 03/27/21   Azucena FallenLancaster, William C, MD  atorvastatin (LIPITOR) 40 MG tablet Take 1 tablet by mouth once daily Patient taking differently: Take 40 mg by mouth daily. 02/07/21   Bradd CanaryBlyth, Stacey A, MD  insulin regular (NOVOLIN R) 100 units/mL injection Inject 2-5 Units into the skin 3 (three) times daily before meals.    [provider]  losartan (COZAAR) 50 MG tablet Take 1 tablet by mouth once daily 02/07/21   Bradd CanaryBlyth, Stacey A, MD  valACYclovir (VALTREX) 500 MG tablet Take 1 tablet (500 mg total) by mouth 2 (two) times daily. 11/27/20   Bradd CanaryBlyth, Stacey A, MD    Inpatient Medications: Scheduled Meds: . amLODipine  5 mg Oral Daily  . [START ON 04/01/2021] aspirin EC  81 mg Oral Daily  . atorvastatin  40 mg Oral Daily  . heparin  4,000 Units Intravenous Once  . losartan  50 mg Oral Daily  . metoprolol tartrate  12.5 mg Oral BID  . valACYclovir  500 mg Oral BID   Continuous Infusions: . sodium chloride    . heparin    . nitroGLYCERIN 5 mcg/min (03/31/21 1633)   PRN Meds: acetaminophen, ondansetron (ZOFRAN) IV  Allergies:    Allergies  Allergen Reactions  . Ciprofloxacin Hcl Anaphylaxis  . Gentamicin Anaphylaxis  . Penicillins Anaphylaxis  . Ambien [Zolpidem Tartrate] Other (See Comments)    Sleep walking   . Asa [Aspirin] Other (See Comments)    Rectal bleeding   . Banana Itching, Swelling and Other (See Comments)    Tongue itches and swells- breathing not impaired  . Ivp Dye [Iodinated Diagnostic Agents] Nausea And Vomiting and Other (See Comments)    Severe vomiting   . Lisinopril Cough  . Metformin And Related Nausea Only and Other (See Comments)    Severe GI upset  . Nsaids Other (See Comments)    Rectal bleeding   . Pineapple Other (See Comments)    Causes a burning sensation in the mouth  . Tape Other (See Comments)    Paper tape "burns the skin"  . Codeine Rash   . Flagyl [Metronidazole] Rash  . Morphine And Related Rash    Social History:   Social History   Socioeconomic History  . Marital status: Widowed    Spouse name: Not on file  . Number of children: Not on file  . Years of education: Not on file  . Highest education level: Not on file  Occupational History  . Not on file  Tobacco Use  . Smoking status: Current Every Day Smoker    Packs/day: 0.50    Years: 30.00    Pack years: 15.00    Types: Cigarettes  . Smokeless tobacco: Never Used  Vaping Use  . Vaping Use: Never used  Substance and Sexual Activity  . Alcohol use: Never  . Drug  use: Never  . Sexual activity: Not Currently    Birth control/protection: Surgical  Other Topics Concern  . Not on file  Social History Narrative  . Not on file   Social Determinants of Health   Financial Resource Strain: Not on file  Food Insecurity: Not on file  Transportation Needs: Not on file  Physical Activity: Not on file  Stress: Not on file  Social Connections: Not on file  Intimate Partner Violence: Not on file    Family History:    Family History  Problem Relation Age of Onset  . Hypertension Mother   . Diabetes Mother        prediabetes  . Hyperlipidemia Mother   . Diabetes Father   . Cataracts Father   . Eczema Brother   . Asthma Brother   . Diabetes Maternal Grandmother   . Hypertension Maternal Grandmother   . Diabetes Maternal Grandfather   . Hypertension Maternal Grandfather   . Hyperlipidemia Maternal Grandfather   . Heart failure Maternal Grandfather   . COPD Maternal Grandfather   . ODD Son   . ADD / ADHD Son   . Diabetes Daughter   . Hypertension Daughter   . Colon cancer Neg Hx   . Stomach cancer Neg Hx   . Pancreatic cancer Neg Hx   . Esophageal cancer Neg Hx      ROS:  Constitutional: Denied fever, chills, malaise, night sweats Eyes: Denied vision change or loss Ears/Nose/Mouth/Throat: Denied ear ache, sore throat, coughing, sinus  pain Cardiovascular: see HPI Respiratory: see HPI  Gastrointestinal:see HPI  Genital/Urinary: Denied dysuria, hematuria, urinary frequency/urgency Musculoskeletal: Denied muscle ache, joint pain, weakness Skin: Denied rash, wound Neuro: Denied headache, dizziness, syncope Psych: Denied history of depression/anxiety  Endocrine: history of diabetes     Physical Exam/Data:   Vitals:   03/31/21 1500 03/31/21 1515 03/31/21 1635 03/31/21 1640  BP:   (!) 150/98   Pulse:  93 91 91  Resp:  17 14 (!) 24  Temp:      TempSrc:      SpO2:  100%  97%  Weight: 91.6 kg     Height: 5\' 7"  (1.702 m)      No intake or output data in the 24 hours ending 03/31/21 1647 Last 3 Weights 03/31/2021 03/26/2021 03/25/2021  Weight (lbs) 202 lb 203 lb 4.8 oz 200 lb  Weight (kg) 91.627 kg 92.216 kg 90.719 kg     Body mass index is 31.64 kg/m.   Vitals:  Vitals:   03/31/21 1635 03/31/21 1640  BP: (!) 150/98   Pulse: 91 91  Resp: 14 (!) 24  Temp:    SpO2:  97%   General Appearance: In no apparent distress, laying in bed HEENT: Normocephalic, atraumatic. EOMs intact. Sclera anicteric.  Neck: Supple, trachea midline, no JVDs Cardiovascular: Regular rate and rhythm, normal S1-S2,  no murmur/rub/gallop Respiratory: Resting breathing unlabored, lungs sounds clear to auscultation bilaterally, no use of accessory muscles. On room air.  No wheezes, rales or rhonchi.   Gastrointestinal: Bowel sounds positive, abdomen soft, non-tender, non-distended. No mass or organomegaly.  Extremities: Able to move all extremities in bed without difficulty, no edema/cyanosis/clubbing, DP/PT pulses were 2+ and equal bilaterally Genitourinary: Genital exam not performed Musculoskeletal: Normal muscle bulk and tone, no limited range of motion, no swollen or erythematous joints Skin: Intact, warm, dry. No rashes or petechiae noted in exposed areas.  Neurologic: Alert, oriented to person, place and time. Fluent speech, no cognitive  deficit, no gross focal neuro deficit Psychiatric: Normal affect. Mood is appropriate.    EKG:  The EKG was personally reviewed and demonstrates:  EKG shows sinus rhythm with ventricular rate no acute change compared to EKG from 03/25/21.   Telemetry:  Telemetry was personally reviewed and demonstrates:  Sinus rhythm with rate of 80s, artifacts, no acute events noted    Relevant CV Studies:  Echo from 03/26/21:  1. Left ventricular ejection fraction, by estimation, is 60 to 65%. The  left ventricle has normal function. The left ventricle has no regional  wall motion abnormalities. Left ventricular diastolic parameters are  consistent with Grade II diastolic  dysfunction (pseudonormalization). Elevated left atrial pressure.  2. Right ventricular systolic function is normal. The right ventricular  size is normal. There is normal pulmonary artery systolic pressure.  3. Left atrial size was mildly dilated.  4. The mitral valve is normal in structure. Trivial mitral valve  regurgitation. No evidence of mitral stenosis.  5. The aortic valve is normal in structure. Aortic valve regurgitation is  not visualized. No aortic stenosis is present.  6. The inferior vena cava is normal in size with greater than 50%  respiratory variability, suggesting right atrial pressure of 3 mmHg.    Laboratory Data:  High Sensitivity Troponin:   Recent Labs  Lab 03/25/21 1724 03/25/21 2006 03/25/21 2359 03/31/21 1240 03/31/21 1425  TROPONINIHS 23* 32* 29* 500* 799*     Chemistry Recent Labs  Lab 03/25/21 1724 03/31/21 1240  NA 134* 133*  K 3.8 3.9  CL 100 99  CO2 26 24  GLUCOSE 300* 222*  BUN 8 10  CREATININE 0.64 0.69  CALCIUM 9.3 9.8  GFRNONAA >60 >60  ANIONGAP 8 10    No results for input(s): PROT, ALBUMIN, AST, ALT, ALKPHOS, BILITOT in the last 168 hours. Hematology Recent Labs  Lab 03/25/21 1724 03/31/21 1240  WBC 7.9 10.0  RBC 4.72 4.92  HGB 14.9 15.7*  HCT 45.1 47.1*   MCV 95.6 95.7  MCH 31.6 31.9  MCHC 33.0 33.3  RDW 12.5 12.5  PLT 328 367   BNPNo results for input(s): BNP, PROBNP in the last 168 hours.  DDimer No results for input(s): DDIMER in the last 168 hours.   Radiology/Studies:  DG Chest 2 View  Result Date: 03/31/2021 CLINICAL DATA:  Chest pain, shortness breath, nausea and vomiting for 8 days. EXAM: CHEST - 2 VIEW COMPARISON:  Chest x-ray dated 03/25/2021. FINDINGS: Heart size and mediastinal contours are within normal limits. Lungs are clear. No pleural effusion or pneumothorax is seen. Osseous structures about the chest are unremarkable. IMPRESSION: No active cardiopulmonary disease.  No evidence of pneumonia. Electronically Signed   By: Bary Richard M.D.   On: 03/31/2021 13:54   DG Chest Port 1 View  Result Date: 03/31/2021 CLINICAL DATA:  Chest pain for 8 days, nausea and vomiting, tobacco abuse EXAM: PORTABLE CHEST 1 VIEW COMPARISON:  03/31/2021 FINDINGS: Single frontal view of the chest demonstrates an unremarkable cardiac silhouette. Chronic interstitial prominence consistent with scarring and tobacco abuse. No airspace disease, effusion, or pneumothorax. No acute bony abnormalities. IMPRESSION: 1. No acute intrathoracic process. Electronically Signed   By: Sharlet Salina M.D.   On: 03/31/2021 15:46     Assessment and Plan:   NSTEMI -Presented with intermittent chest pain over the past 1 week, continue to have chest pain now  -Hs Trop 500 >799, trend to peak -EKG without acute ST-T change comparing to 03/25/2021 -Echo  from 03/26/2021 with EF 60 to 65%, no RWMA, elevated left atrial pressure, grade 2 diastolic DD, mildly dilated left atrium, trivial MR -Clinical picture concerning for ischemic process, recommend cardiac catheterization -please start heparin gtt and nitro gtt, discussed with ER RN  -start aspirin 81 daily, start metoprolol 12.5mg  BID, continue losartan and statin - A1C and LDL pending   Hypertension -Blood pressure  elevated, overall is improving comparing to 03/25/2021 hospitalization -Continue losartan and amlodipine, add beta-blocker as above   Type 2 diabetes -Hemoglobin A1c is pending -Nathan Stallworth start NovoLog insulin SSI AC -[facility] diet  Hyperlipidemia -Lipid panel is pending -Continue statin  Multiple allergies - patient has large list of allergy, denied ananaphylaxis reaction to ASA, Plavix, Brilinta, Heparin - noted allergy to contrast dye, Khyli Swaim start pre-medication regimen with   Tobacco abuse - recommended smoking cessation - currently smokes <1/2 pack daily      Risk Assessment/Risk Scores:     TIMI Risk Score for Unstable Angina or Non-ST Elevation MI:   The patient's TIMI risk score is 2  , which indicates a  2.2% risk of all cause mortality, new or recurrent myocardial infarction or need for urgent revascularization in the next 14 days.     For questions or updates, please contact [facility] Please consult [email or web address] for contact info under    Signed, Cyndi Bender, NP  03/31/2021 4:47 PM   I have seen and examined this patient with Cyndi Bender.  Agree with above, note added to reflect my findings.  On exam, RRR, no murmurs, lungs clear.  Patient admitted to the hospital with chest pain, found to have a troponin elevated to 799 consistent with non-STEMI.  In the emergency room, she has been started on heparin and nitroglycerin drip and given aspirin.  We Kiree Dejarnette plan for left heart catheterization tomorrow.  The patient understands that risks include but are not limited to stroke (1 in 1000), death (1 in 1000), kidney failure [usually temporary] (1 in 500), bleeding (1 in 200), allergic reaction [possibly serious] (1 in 200), and agrees to proceed.  Mystie Ormand M. Demetrius Barrell MD 03/31/2021 4:52 PM

## 2021-03-31 NOTE — ED Triage Notes (Signed)
Pt reports chest pain, sob, nausea, and vomiting x 8 days.  States she was admitted to hospital last Monday for same and isn't feeling any better.

## 2021-03-31 NOTE — Progress Notes (Signed)
ANTICOAGULATION CONSULT NOTE - Initial Consult  Pharmacy Consult for heparin Indication: chest pain/ACS  Allergies  Allergen Reactions  . Ciprofloxacin Hcl Anaphylaxis  . Gentamicin Anaphylaxis  . Penicillins Anaphylaxis  . Ambien [Zolpidem Tartrate] Other (See Comments)    Sleep walking   . Asa [Aspirin] Other (See Comments)    Rectal bleeding   . Ivp Dye [Iodinated Diagnostic Agents] Other (See Comments)    Severe vomiting   . Lisinopril Cough  . Metformin And Related Other (See Comments)    Severe GI upset  . Nsaids Other (See Comments)    Rectal bleeding   . Codeine Rash  . Flagyl [Metronidazole] Rash  . Morphine And Related Rash    Patient Measurements: Height: 5\' 7"  (170.2 cm) Weight: 91.6 kg (202 lb) IBW/kg (Calculated) : 61.6 Heparin Dosing Weight: 81.4kg  Vital Signs: Temp: 98.3 F (36.8 C) (05/01 1411) Temp Source: Oral (05/01 1411) BP: 160/105 (05/01 1500) Pulse Rate: 93 (05/01 1515)  Labs: Recent Labs    03/31/21 1240 03/31/21 1425  HGB 15.7*  --   HCT 47.1*  --   PLT 367  --   APTT 35  --   LABPROT 12.4  --   INR 0.9  --   CREATININE 0.69  --   TROPONINIHS 500* 799*    Estimated Creatinine Clearance: 99.9 mL/min (by C-G formula based on SCr of 0.69 mg/dL).   Medical History: Past Medical History:  Diagnosis Date  . Depression    situational  . Diabetes mellitus without complication (HCC)    diet controlled  . Endometriosis   . History of chicken pox   . HSV-2 (herpes simplex virus 2) infection   . Hyperglycemia 07/24/2019  . Hyperlipidemia   . Hypertension   . Insomnia   . Snoring   . Tonsillitis and adenoiditis, chronic    childhood, caused adult snoring    Medications:  Infusions:  . sodium chloride    . heparin 1,000 Units/hr (03/31/21 1654)  . nitroGLYCERIN 5 mcg/min (03/31/21 1633)    Assessment: 49 yof presented to the ED with CP. Troponin elevated and now starting IV heparin for treatment of NSTEMI. CBC is ok  and she is not on anticoagulation PTA.  Goal of Therapy:  Heparin level 0.3-0.7 units/ml Monitor platelets by anticoagulation protocol: Yes   Plan:  Heparin bolus 4000 units IV x 1 Heparin gtt 1000 units/hr Check a 6 hr heparin level Daily heparin level and CBC  Kimblery Diop, 05/31/21 03/31/2021,4:24 PM

## 2021-04-01 ENCOUNTER — Other Ambulatory Visit (HOSPITAL_COMMUNITY): Payer: Self-pay

## 2021-04-01 ENCOUNTER — Encounter (HOSPITAL_COMMUNITY): Payer: Self-pay | Admitting: Cardiology

## 2021-04-01 ENCOUNTER — Inpatient Hospital Stay: Payer: BC Managed Care – PPO | Admitting: Family Medicine

## 2021-04-01 ENCOUNTER — Inpatient Hospital Stay: Payer: BC Managed Care – PPO | Admitting: Family

## 2021-04-01 ENCOUNTER — Encounter (HOSPITAL_COMMUNITY)
Admission: EM | Disposition: A | Discharge status: Home / Self Care | Payer: Self-pay | Source: Home / Self Care | Attending: Cardiology

## 2021-04-01 DIAGNOSIS — E1165 Type 2 diabetes mellitus with hyperglycemia: Secondary | ICD-10-CM

## 2021-04-01 DIAGNOSIS — I25110 Atherosclerotic heart disease of native coronary artery with unstable angina pectoris: Secondary | ICD-10-CM

## 2021-04-01 DIAGNOSIS — I2510 Atherosclerotic heart disease of native coronary artery without angina pectoris: Secondary | ICD-10-CM | POA: Insufficient documentation

## 2021-04-01 HISTORY — PX: LEFT HEART CATH AND CORONARY ANGIOGRAPHY: CATH118249

## 2021-04-01 HISTORY — PX: CORONARY STENT INTERVENTION: CATH118234

## 2021-04-01 LAB — BASIC METABOLIC PANEL
Anion gap: 13 (ref 5–15)
BUN: 16 mg/dL (ref 6–20)
CO2: 23 mmol/L (ref 22–32)
Calcium: 9.5 mg/dL (ref 8.9–10.3)
Chloride: 100 mmol/L (ref 98–111)
Creatinine, Ser: 0.99 mg/dL (ref 0.44–1.00)
GFR, Estimated: >60 mL/min (ref >60)
Glucose, Bld: 185 mg/dL — ABNORMAL HIGH (ref 70–99)
Potassium: 4.6 mmol/L (ref 3.5–5.1)
Sodium: 136 mmol/L (ref 135–145)

## 2021-04-01 LAB — GLUCOSE, CAPILLARY
Glucose-Capillary: 353 mg/dL — ABNORMAL HIGH (ref 70–99)
Glucose-Capillary: 311 mg/dL — ABNORMAL HIGH (ref 70–99)
Glucose-Capillary: 396 mg/dL — ABNORMAL HIGH (ref 70–99)
Glucose-Capillary: 353 mg/dL — ABNORMAL HIGH (ref 70–99)

## 2021-04-01 LAB — CBC
HCT: 42 % (ref 36.0–46.0)
Hemoglobin: 14.1 g/dL (ref 12.0–15.0)
MCH: 32.2 pg (ref 26.0–34.0)
MCHC: 33.6 g/dL (ref 30.0–36.0)
MCV: 95.9 fL (ref 80.0–100.0)
Platelets: 333 10*3/uL (ref 150–400)
RBC: 4.38 MIL/uL (ref 3.87–5.11)
RDW: 12.8 % (ref 11.5–15.5)
WBC: 10.2 10*3/uL (ref 4.0–10.5)
nRBC: 0 % (ref 0.0–0.2)

## 2021-04-01 LAB — LIPID PANEL
Cholesterol: 130 mg/dL (ref 0–200)
HDL: 39 mg/dL — ABNORMAL LOW (ref >40)
LDL Cholesterol: 82 mg/dL (ref 0–99)
Total CHOL/HDL Ratio: 3.3 RATIO
Triglycerides: 44 mg/dL (ref <150)
VLDL: 9 mg/dL (ref 0–40)

## 2021-04-01 LAB — POCT ACTIVATED CLOTTING TIME
Activated Clotting Time: 315 seconds
Activated Clotting Time: 279 seconds
Activated Clotting Time: 350 seconds

## 2021-04-01 LAB — HEPARIN LEVEL (UNFRACTIONATED): Heparin Unfractionated: 0.23 IU/mL — ABNORMAL LOW (ref 0.30–0.70)

## 2021-04-01 SURGERY — LEFT HEART CATH AND CORONARY ANGIOGRAPHY
Anesthesia: LOCAL

## 2021-04-01 MED ORDER — MIDAZOLAM HCL 2 MG/2ML IJ SOLN
INTRAMUSCULAR | Status: DC | PRN
  Administered 2021-04-01: 1 mg via INTRAVENOUS

## 2021-04-01 MED ORDER — AMLODIPINE BESYLATE 10 MG PO TABS
10.0000 mg | ORAL_TABLET | Freq: Every day | ORAL | Status: DC
  Administered 2021-04-02: 10 mg via ORAL
  Filled 2021-04-01: qty 1

## 2021-04-01 MED ORDER — ATORVASTATIN CALCIUM 80 MG PO TABS
80.0000 mg | ORAL_TABLET | Freq: Every day | ORAL | Status: DC
  Administered 2021-04-01: 80 mg via ORAL
  Filled 2021-04-01: qty 1

## 2021-04-01 MED ORDER — TICAGRELOR 90 MG PO TABS
ORAL_TABLET | ORAL | Status: DC | PRN
  Administered 2021-04-01: 180 mg via ORAL

## 2021-04-01 MED ORDER — HYDRALAZINE HCL 20 MG/ML IJ SOLN: 10.0000 mg | INTRAMUSCULAR | Status: AC | PRN

## 2021-04-01 MED ORDER — HEPARIN (PORCINE) IN NACL 2-0.9 UNITS/ML
INTRAMUSCULAR | Status: DC | PRN
  Administered 2021-04-01: 10 mL via INTRA_ARTERIAL

## 2021-04-01 MED ORDER — NITROGLYCERIN 1 MG/10 ML FOR IR/CATH LAB
INTRA_ARTERIAL | Status: AC
  Filled 2021-04-01: qty 10

## 2021-04-01 MED ORDER — HEPARIN (PORCINE) IN NACL 1000-0.9 UT/500ML-% IV SOLN
INTRAVENOUS | Status: AC
  Filled 2021-04-01: qty 1000

## 2021-04-01 MED ORDER — SODIUM CHLORIDE 0.9% FLUSH
3.0000 mL | Freq: Two times a day (BID) | INTRAVENOUS | Status: DC
  Administered 2021-04-01 – 2021-04-02 (×2): 3 mL via INTRAVENOUS

## 2021-04-01 MED ORDER — AMLODIPINE BESYLATE 5 MG PO TABS
5.0000 mg | ORAL_TABLET | Freq: Once | ORAL | Status: AC
  Administered 2021-04-01: 5 mg via ORAL
  Filled 2021-04-01: qty 1

## 2021-04-01 MED ORDER — LIDOCAINE HCL (PF) 1 % IJ SOLN
INTRAMUSCULAR | Status: DC | PRN
  Administered 2021-04-01: 4 mL

## 2021-04-01 MED ORDER — HEPARIN (PORCINE) IN NACL 1000-0.9 UT/500ML-% IV SOLN
INTRAVENOUS | Status: DC | PRN
  Administered 2021-04-01 (×2): 500 mL

## 2021-04-01 MED ORDER — MIDAZOLAM HCL 2 MG/2ML IJ SOLN
INTRAMUSCULAR | Status: AC
  Filled 2021-04-01: qty 2

## 2021-04-01 MED ORDER — NITROGLYCERIN 1 MG/10 ML FOR IR/CATH LAB
INTRA_ARTERIAL | Status: DC | PRN
  Administered 2021-04-01 (×3): 200 ug via INTRACORONARY

## 2021-04-01 MED ORDER — DOCUSATE SODIUM 100 MG PO CAPS: 100.0000 mg | ORAL_CAPSULE | Freq: Two times a day (BID) | ORAL | Status: DC | PRN

## 2021-04-01 MED ORDER — FENTANYL CITRATE (PF) 100 MCG/2ML IJ SOLN
INTRAMUSCULAR | Status: DC | PRN
  Administered 2021-04-01: 25 ug via INTRAVENOUS

## 2021-04-01 MED ORDER — LABETALOL HCL 5 MG/ML IV SOLN
10.0000 mg | INTRAVENOUS | Status: AC | PRN
  Administered 2021-04-01: 10 mg via INTRAVENOUS
  Filled 2021-04-01: qty 4

## 2021-04-01 MED ORDER — SODIUM CHLORIDE 0.9 % IV SOLN: 250.0000 mL | INTRAVENOUS | Status: DC | PRN

## 2021-04-01 MED ORDER — HEPARIN SODIUM (PORCINE) 1000 UNIT/ML IJ SOLN
INTRAMUSCULAR | Status: DC | PRN
  Administered 2021-04-01 (×2): 5000 [IU] via INTRAVENOUS

## 2021-04-01 MED ORDER — LIDOCAINE HCL (PF) 1 % IJ SOLN
INTRAMUSCULAR | Status: AC
  Filled 2021-04-01: qty 30

## 2021-04-01 MED ORDER — LABETALOL HCL 5 MG/ML IV SOLN
INTRAVENOUS | Status: AC
  Filled 2021-04-01: qty 4

## 2021-04-01 MED ORDER — POLYETHYLENE GLYCOL 3350 17 G PO PACK
17.0000 g | PACK | Freq: Every day | ORAL | Status: DC
  Administered 2021-04-01 – 2021-04-02 (×2): 17 g via ORAL
  Filled 2021-04-01 (×2): qty 1

## 2021-04-01 MED ORDER — TICAGRELOR 90 MG PO TABS
ORAL_TABLET | ORAL | Status: AC
  Filled 2021-04-01: qty 1

## 2021-04-01 MED ORDER — HEPARIN SODIUM (PORCINE) 1000 UNIT/ML IJ SOLN
INTRAMUSCULAR | Status: AC
  Filled 2021-04-01: qty 1

## 2021-04-01 MED ORDER — IOHEXOL 350 MG/ML SOLN
INTRAVENOUS | Status: DC | PRN
Start: 2021-04-01 — End: 2021-04-01
  Administered 2021-04-01: 125 mL

## 2021-04-01 MED ORDER — SODIUM CHLORIDE 0.9 % IV SOLN: INTRAVENOUS | Status: AC

## 2021-04-01 MED ORDER — LABETALOL HCL 5 MG/ML IV SOLN
INTRAVENOUS | Status: DC | PRN
  Administered 2021-04-01: 10 mg via INTRAVENOUS

## 2021-04-01 MED ORDER — SODIUM CHLORIDE 0.9% FLUSH: 3.0000 mL | INTRAVENOUS | Status: DC | PRN

## 2021-04-01 MED ORDER — VERAPAMIL HCL 2.5 MG/ML IV SOLN
INTRAVENOUS | Status: AC
  Filled 2021-04-01: qty 2

## 2021-04-01 MED ORDER — TICAGRELOR 90 MG PO TABS
90.0000 mg | ORAL_TABLET | Freq: Two times a day (BID) | ORAL | Status: DC
  Administered 2021-04-01 – 2021-04-02 (×2): 90 mg via ORAL
  Filled 2021-04-01 (×2): qty 1

## 2021-04-01 MED ORDER — FENTANYL CITRATE (PF) 100 MCG/2ML IJ SOLN
INTRAMUSCULAR | Status: AC
  Filled 2021-04-01: qty 2

## 2021-04-01 SURGICAL SUPPLY — 21 items
BALLN SAPPHIRE 2.5X12 (BALLOONS) ×2
BALLN ~~LOC~~ EMERGE MR 4.0X12 (BALLOONS) ×2
BALLN ~~LOC~~ EUPHORA RX 3.5X8 (BALLOONS) ×2
BALLOON SAPPHIRE 2.5X12 (BALLOONS) ×1 IMPLANT
BALLOON ~~LOC~~ EMERGE MR 4.0X12 (BALLOONS) ×1 IMPLANT
BALLOON ~~LOC~~ EUPHORA RX 3.5X8 (BALLOONS) ×1 IMPLANT
CATH OPTITORQUE TIG 4.0 5F (CATHETERS) ×2 IMPLANT
CATH VISTA GUIDE 6FR XBLAD3.5 (CATHETERS) ×2 IMPLANT
DEVICE RAD COMP TR BAND LRG (VASCULAR PRODUCTS) ×2 IMPLANT
GLIDESHEATH SLEND SS 6F .021 (SHEATH) ×2 IMPLANT
GUIDEWIRE INQWIRE 1.5J.035X260 (WIRE) ×1 IMPLANT
INQWIRE 1.5J .035X260CM (WIRE) ×2
KIT ENCORE 26 ADVANTAGE (KITS) ×2 IMPLANT
KIT HEART LEFT (KITS) ×2 IMPLANT
PACK CARDIAC CATHETERIZATION (CUSTOM PROCEDURE TRAY) ×2 IMPLANT
SHEATH PROBE COVER 6X72 (BAG) ×2 IMPLANT
STENT SYNERGY XD 2.75X16 (Permanent Stent) ×1 IMPLANT
SYNERGY XD 2.75X16 (Permanent Stent) ×2 IMPLANT
TRANSDUCER W/STOPCOCK (MISCELLANEOUS) ×2 IMPLANT
TUBING CIL FLEX 10 FLL-RA (TUBING) ×2 IMPLANT
WIRE ASAHI PROWATER 180CM (WIRE) ×2 IMPLANT

## 2021-04-01 NOTE — Progress Notes (Signed)
Inpatient Diabetes Program Recommendations  AACE/ADA: New Consensus Statement on Inpatient Glycemic Control (2015)  Target Ranges:  Prepandial:   less than 140 mg/dL      Peak postprandial:   less than 180 mg/dL (1-2 hours)      Critically ill patients:  140 - 180 mg/dL   Lab Results  Component Value Date   GLUCAP 311 (H) 04/01/2021   HGBA1C 9.5 (H) 03/31/2021    Review of Glycemic Control  Diabetes history: DM 2 diet controlled for 20 years Outpatient Diabetes medications: Novolin R (regular) from [facility] Sliding scale Current orders for Inpatient glycemic control:  Novolog 0-9 units tid + hs  A1c per pt report before Nov 2021 6.5%, January 2022 11, now currently 9.5%  Spoke with pt at bedside regarding glucose control and A1c. Pt reports that she was placed on steroids from Allerg[facility] in November and asked for sliding scale but was never placed on it back in November 2021.  Since then she has had a hard time controlling glucose levels.  Pt did reveal she checks her glucose twice a week when she thinks her glucose levels are and and uses the sliding scale she was given from her PCP at those times. Pt reports when she does check her glucose its in the 200 range.  Pt reports when first diagnosed she was on pills and insulin but lost weight ad was able to control glucose through diet. 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  Formulated plan with pt to consistently check glucose 3-4 times a day before meals and consistently use the sliding scale at home. Pt was due to follow up with her PCP today 5/2, however will reschedule this. Pt was told to see her PCP sooner (Q3 months) if glucose levels are above 250, but every 5 months if below.  Told pt she will need to have tighter control of glucose levels since we know she has had a stent.  Pt reports needing more 50 units insulin syringes order #[id number]  Thanks,  Christena Deem RN,  MSN, BC-ADM Inpatient Diabetes Coordinator Team Pager [phone] (8a-5p)

## 2021-04-01 NOTE — Interval H&P Note (Signed)
History and Physical Interval Note:  04/01/2021 9:45 AM  Bonnie Dickson  has presented today for surgery, with the diagnosis of NSTEMI.  The various methods of treatment have been discussed with the patient and family. After consideration of risks, benefits and other options for treatment, the patient has consented to  Procedure(s): LEFT HEART CATH AND CORONARY ANGIOGRAPHY (N/A)  PERCUTANEOUS CORONARY INTERVENTION  as a surgical intervention.  The patient's history has been reviewed, patient examined, no change in status, stable for surgery.  I have reviewed the patient's chart and labs.  Questions were answered to the patient's satisfaction.    Cath Lab Visit (complete for each Cath Lab visit)  Clinical Evaluation Leading to the Procedure:   ACS: Yes.    Non-ACS:    Anginal Classification: CCS IV  Anti-ischemic medical therapy: Minimal Therapy (1 class of medications)  Non-Invasive Test Results: No non-invasive testing performed  Prior CABG: No previous CABG    Bryan Lemma

## 2021-04-01 NOTE — Progress Notes (Signed)
Per d/w nurse, patient was hypertensive in the cath lab and received labetalol in the lab and got 10mg  of IV labetalol a short while ago. BP now 146/86. Current regimen includes amlodipine 5mg  and losartan 50mg  as well as Lopressor 25mg  BID. Will give another 5mg  amlodipine now and titrate dose to 10mg  daily tomorrow. Check TSH with AM labs. If BP remains elevated will need to consider titration of losartan vs change of metoprolol to carvedilol. Jatniel Verastegui PA-C

## 2021-04-01 NOTE — TOC Benefit Eligibility Note (Signed)
Patient Product/process development scientist completed.    The patient is currently admitted and upon discharge could be taking Brilinta 90 mg.  The current 30 day co-pay is, $24.99.   The patient is currently admitted and upon discharge could be taking Farxiga 10 mg.  Requires Prior Authorization   The patient is currently admitted and upon discharge could be taking Jardiance 10 mg.  Requires Prior Authorization   The patient is insured through [employer]    Roland Earl, CPhT Pharmacy Patient Advocate Specialist [facility] Health Antimicrobial Stewardship Team Direct Number: [phone]  Fax: [phone]

## 2021-04-01 NOTE — Progress Notes (Addendum)
Progress Note  Patient Name: Bonnie Dickson Date of Encounter: 04/01/2021  Primary Cardiologist: Chrystie Nose, MD  Subjective   Continues to have waxing/waning smoldering substernal chest pain. Had acceleration yesterday relieved with Dilaudid/phenergan. Began back around a 3/10 overnight, accelerating to 7/10 this morning. IV NTG being titrated but no significant change in sx (currently @ ).  Inpatient Medications    Scheduled Meds: . amLODipine  5 mg Oral Daily  . aspirin EC  81 mg Oral Daily  . atorvastatin  40 mg Oral q1800  . diphenhydrAMINE  50 mg Oral Once   Or  . diphenhydrAMINE  50 mg Intravenous Once  . insulin aspart  0-5 Units Subcutaneous QHS  . insulin aspart  0-9 Units Subcutaneous TID WC  . losartan  50 mg Oral Daily  . metoprolol tartrate  25 mg Oral BID  . predniSONE  50 mg Oral Q6H  . sodium chloride flush  3 mL Intravenous Q12H  . valACYclovir  500 mg Oral BID   Continuous Infusions: . sodium chloride    . sodium chloride    . sodium chloride 1 mL/kg/hr (04/01/21 0715)  . heparin 1,300 Units/hr (04/01/21 0616)  . nitroGLYCERIN 20 mcg/min (04/01/21 0752)   PRN Meds: sodium chloride, acetaminophen, ondansetron (ZOFRAN) IV, sodium chloride flush   Vital Signs    Vitals:   03/31/21 2355 04/01/21 0605 04/01/21 0744 04/01/21 0751  BP: 138/73 (!) 149/79 (!) 160/86 (!) 156/81  Pulse: 74 75 72 70  Resp: 18 19 (!) 21   Temp: 98 F (36.7 C) 98.1 F (36.7 C) 97.6 F (36.4 C)   TempSrc: Oral Oral Oral   SpO2: 94% 94% 94% 92%  Weight:  92.6 kg    Height:        Intake/Output Summary (Last 24 hours) at 04/01/2021 0816 Last data filed at 04/01/2021 0616 Gross per 24 hour  Intake 318.3 ml  Output --  Net 318.3 ml   Last 3 Weights 04/01/2021 03/31/2021 03/31/2021  Weight (lbs) 204 lb 3.2 oz 201 lb 6.4 oz 202 lb  Weight (kg) 92.625 kg 91.354 kg 91.627 kg     Telemetry    NSR - Personally Reviewed  ECG    No new tracings but reviewed  yesterday's demonstrating NSR 93bpm, subtle ST coving V1-V3 with TWI V2-V5, I, avL, and flattening in lead II  Repeat tracing ordered this AM upon report of chest pain  Physical Exam   General: Well developed, well nourished AAF in no acute distress. Head: Normocephalic, atraumatic, sclera non-icteric, no xanthomas, nares are without discharge. Neck: Negative for carotid bruits. JVP not elevated. Lungs: Clear bilaterally to auscultation without wheezes, rales, or rhonchi. Breathing is unlabored. Heart: RRR S1 S2 without murmurs, rubs, or gallops.  Abdomen: Soft, non-tender, non-distended with normoactive bowel sounds. No rebound/guarding. Extremities: No clubbing or cyanosis. No edema. Distal pedal pulses are 2+ and equal bilaterally. Neuro: Alert and oriented X 3. Moves all extremities spontaneously. Psych:  Responds to questions appropriately with a normal affect.  Labs    High Sensitivity Troponin:   Recent Labs  Lab 03/25/21 2006 03/25/21 2359 03/31/21 1240 03/31/21 1425 03/31/21 1730  TROPONINIHS 32* 29* 500* 799* 1,057*      Cardiac EnzymesNo results for input(s): TROPONINI in the last 168 hours. No results for input(s): TROPIPOC in the last 168 hours.   Chemistry Recent Labs  Lab 03/25/21 1724 03/31/21 1240 04/01/21 0122  NA 134* 133* 136  K 3.8 3.9 4.6  CL 100 99 100  CO2 26 24 23   GLUCOSE 300* 222* 185*  BUN 8 10 16   CREATININE 0.64 0.69 0.99  CALCIUM 9.3 9.8 9.5  GFRNONAA >60 >60 >60  ANIONGAP 8 10 13      Hematology Recent Labs  Lab 03/25/21 1724 03/31/21 1240 04/01/21 0122  WBC 7.9 10.0 10.2  RBC 4.72 4.92 4.38  HGB 14.9 15.7* 14.1  HCT 45.1 47.1* 42.0  MCV 95.6 95.7 95.9  MCH 31.6 31.9 32.2  MCHC 33.0 33.3 33.6  RDW 12.5 12.5 12.8  PLT 328 367 333    BNPNo results for input(s): BNP, PROBNP in the last 168 hours.   DDimer No results for input(s): DDIMER in the last 168 hours.   Radiology    DG Chest 2 View  Result Date:  03/31/2021 CLINICAL DATA:  Chest pain, shortness breath, nausea and vomiting for 8 days. EXAM: CHEST - 2 VIEW COMPARISON:  Chest x-ray dated 03/25/2021. FINDINGS: Heart size and mediastinal contours are within normal limits. Lungs are clear. No pleural effusion or pneumothorax is seen. Osseous structures about the chest are unremarkable. IMPRESSION: No active cardiopulmonary disease.  No evidence of pneumonia. Electronically Signed   By: 06/01/21 M.D.   On: 03/31/2021 13:54   DG Chest Port 1 View  Result Date: 03/31/2021 CLINICAL DATA:  Chest pain for 8 days, nausea and vomiting, tobacco abuse EXAM: PORTABLE CHEST 1 VIEW COMPARISON:  03/31/2021 FINDINGS: Single frontal view of the chest demonstrates an unremarkable cardiac silhouette. Chronic interstitial prominence consistent with scarring and tobacco abuse. No airspace disease, effusion, or pneumothorax. No acute bony abnormalities. IMPRESSION: 1. No acute intrathoracic process. Electronically Signed   By: 05/31/2021 M.D.   On: 03/31/2021 15:46    Cardiac Studies   2D echo 03/26/21 1. Left ventricular ejection fraction, by estimation, is 60 to 65%. The  left ventricle has normal function. The left ventricle has no regional  wall motion abnormalities. Left ventricular diastolic parameters are  consistent with Grade II diastolic  dysfunction (pseudonormalization). Elevated left atrial pressure.  2. Right ventricular systolic function is normal. The right ventricular  size is normal. There is normal pulmonary artery systolic pressure.  3. Left atrial size was mildly dilated.  4. The mitral valve is normal in structure. Trivial mitral valve  regurgitation. No evidence of mitral stenosis.  5. The aortic valve is normal in structure. Aortic valve regurgitation is  not visualized. No aortic stenosis is present.  6. The inferior vena cava is normal in size with greater than 50%  respiratory variability, suggesting right atrial pressure  of 3 mmHg.   Patient Profile     50 y.o. female with hypertension, hyperlipidemia, type 2 diabetes, irritable bowel syndrome, situational depression, HSV, endometriosis. Recently admitted 03/2021 with atypical chest pain and low/flat troponin elevation, felt due to hypertensive urgency.  Readmitted 03/31/2021 with persistent chest pain (sharp, burning, aching) and found to have troponin elevation [phone].  Assessment & Plan    1. Chest pain/NSTEMI - elevated troponins concerning for ACS (I.e. CAD, coronary dissection, vasospasm) - for cath today with pre-medication protocol for IV dye allergy - I spoke with the cath lab team to expedite case given ongoing chest pain. Nurse notified verbally to give last dose of prednisone now along with IV Benadryl - repeat EKG ordered - continue ASA, BB, heparin, statin (prior rectal bleeding on ASA but tolerating well) - hold off repeat echo given recent study - if cath unrevealing, consider r/o  PE or other vascular etiology  2. HTN - remains elevated at times - on amlodipine, losartan PTA, also started on Lopressor last night - follow for now and titrate if needed  3. Uncontrolled DM with hyperglycemia - A1C 9.5, on Novolog PTA - intolerant of metformin due to GI side effects - continue SSI and encourage OP f/u  4. Hyperlipidemia - on atorvastatin 40mg  PTA with LDL of 82 - will titrate to 80mg  daily - consider rechecking liver function/lipid panel in 6-8 weeks.  For questions or updates, please contact [facility] Please consult [email or web address] for contact info under Cardiology/STEMI.  Signed, , PA-C 04/01/2021, 8:16 AM    Patient seen and examined with Dayna Dunna PA-C.  Agree as above, with the following exceptions and changes as noted below. Patient continues to have chest pain, now with readmission with NSTEMI and rising troponins. For LHC today. Gen: NAD, CV: RRR, no murmurs, Lungs: clear, Abd: soft, Extrem: Warm, well  perfused, no edema, Neuro/Psych: alert and oriented x 3, normal mood and affect. All available labs, radiology testing, previous records reviewed. R Rad pulse 4/4, plan for cath today. Receiving premedications currently. Repeat ECG stable compared to most recent prior.  Laurann Montana, MD 04/01/21 9:15 AM

## 2021-04-02 ENCOUNTER — Telehealth: Payer: Self-pay | Admitting: Physician Assistant

## 2021-04-02 ENCOUNTER — Other Ambulatory Visit (HOSPITAL_COMMUNITY): Payer: Self-pay

## 2021-04-02 ENCOUNTER — Encounter (HOSPITAL_COMMUNITY): Payer: Self-pay | Admitting: Cardiology

## 2021-04-02 DIAGNOSIS — I429 Cardiomyopathy, unspecified: Secondary | ICD-10-CM | POA: Diagnosis present

## 2021-04-02 DIAGNOSIS — I25110 Atherosclerotic heart disease of native coronary artery with unstable angina pectoris: Secondary | ICD-10-CM

## 2021-04-02 DIAGNOSIS — E1165 Type 2 diabetes mellitus with hyperglycemia: Secondary | ICD-10-CM

## 2021-04-02 DIAGNOSIS — I255 Ischemic cardiomyopathy: Secondary | ICD-10-CM | POA: Diagnosis present

## 2021-04-02 DIAGNOSIS — IMO0002 Reserved for concepts with insufficient information to code with codable children: Secondary | ICD-10-CM

## 2021-04-02 DIAGNOSIS — R7989 Other specified abnormal findings of blood chemistry: Secondary | ICD-10-CM

## 2021-04-02 LAB — CBC
HCT: 39.8 % (ref 36.0–46.0)
Hemoglobin: 13.4 g/dL (ref 12.0–15.0)
MCH: 31.5 pg (ref 26.0–34.0)
MCHC: 33.7 g/dL (ref 30.0–36.0)
MCV: 93.6 fL (ref 80.0–100.0)
Platelets: 344 10*3/uL (ref 150–400)
RBC: 4.25 MIL/uL (ref 3.87–5.11)
RDW: 12.3 % (ref 11.5–15.5)
WBC: 15.9 10*3/uL — ABNORMAL HIGH (ref 4.0–10.5)
nRBC: 0 % (ref 0.0–0.2)

## 2021-04-02 LAB — GLUCOSE, CAPILLARY
Glucose-Capillary: 297 mg/dL — ABNORMAL HIGH (ref 70–99)
Glucose-Capillary: 236 mg/dL — ABNORMAL HIGH (ref 70–99)

## 2021-04-02 LAB — BASIC METABOLIC PANEL
Anion gap: 10 (ref 5–15)
BUN: 13 mg/dL (ref 6–20)
CO2: 22 mmol/L (ref 22–32)
Calcium: 9.4 mg/dL (ref 8.9–10.3)
Chloride: 101 mmol/L (ref 98–111)
Creatinine, Ser: 0.71 mg/dL (ref 0.44–1.00)
GFR, Estimated: >60 mL/min (ref >60)
Glucose, Bld: 290 mg/dL — ABNORMAL HIGH (ref 70–99)
Potassium: 3.8 mmol/L (ref 3.5–5.1)
Sodium: 133 mmol/L — ABNORMAL LOW (ref 135–145)

## 2021-04-02 LAB — TSH: TSH: 0.326 u[IU]/mL — ABNORMAL LOW (ref 0.350–4.500)

## 2021-04-02 LAB — T4, FREE: Free T4: 0.87 ng/dL (ref 0.61–1.12)

## 2021-04-02 MED ORDER — CARVEDILOL 6.25 MG PO TABS
6.2500 mg | ORAL_TABLET | Freq: Two times a day (BID) | ORAL | Status: DC
  Administered 2021-04-02: 6.25 mg via ORAL
  Filled 2021-04-02: qty 1

## 2021-04-02 MED ORDER — NITROGLYCERIN 0.4 MG SL SUBL
0.4000 mg | SUBLINGUAL_TABLET | SUBLINGUAL | 3 refills | Status: DC | PRN
  Filled 2021-04-02 – 2021-04-05 (×2): qty 25, 8d supply, fill #0

## 2021-04-02 MED ORDER — AMLODIPINE BESYLATE 10 MG PO TABS
10.0000 mg | ORAL_TABLET | Freq: Every day | ORAL | 6 refills | Status: DC
  Filled 2021-04-02 – 2021-05-01 (×3): qty 30, 30d supply, fill #0
  Filled 2021-05-30: qty 30, 30d supply, fill #1
  Filled 2021-06-28: qty 30, 30d supply, fill #2
  Filled 2021-08-02: qty 30, 30d supply, fill #3
  Filled 2021-09-03: qty 30, 30d supply, fill #4

## 2021-04-02 MED ORDER — ATORVASTATIN CALCIUM 80 MG PO TABS
80.0000 mg | ORAL_TABLET | Freq: Every evening | ORAL | 6 refills | Status: DC
  Filled 2021-04-02 – 2021-05-01 (×3): qty 30, 30d supply, fill #0
  Filled 2021-05-30: qty 30, 30d supply, fill #1
  Filled 2021-06-28: qty 30, 30d supply, fill #2

## 2021-04-02 MED ORDER — TICAGRELOR 90 MG PO TABS
90.0000 mg | ORAL_TABLET | Freq: Two times a day (BID) | ORAL | 11 refills | Status: DC
  Filled 2021-04-02 – 2021-05-01 (×3): qty 60, 30d supply, fill #0
  Filled 2021-05-30: qty 60, 30d supply, fill #1
  Filled 2021-06-28 (×2): qty 30, 15d supply, fill #2

## 2021-04-02 MED ORDER — CARVEDILOL 6.25 MG PO TABS
6.2500 mg | ORAL_TABLET | Freq: Two times a day (BID) | ORAL | 6 refills | Status: DC
  Filled 2021-04-02 – 2021-05-01 (×3): qty 60, 30d supply, fill #0
  Filled 2021-05-30: qty 60, 30d supply, fill #1
  Filled 2021-06-28: qty 60, 30d supply, fill #2
  Filled 2021-08-02: qty 60, 30d supply, fill #3
  Filled 2021-09-03: qty 60, 30d supply, fill #4
  Filled 2021-10-04: qty 60, 30d supply, fill #5

## 2021-04-02 MED ORDER — ASPIRIN 81 MG PO TBEC
81.0000 mg | DELAYED_RELEASE_TABLET | Freq: Every day | ORAL | 11 refills | Status: DC
  Filled 2021-04-02 – 2021-05-01 (×3): qty 30, 30d supply, fill #0
  Filled 2021-05-30: qty 30, 30d supply, fill #1
  Filled 2021-06-28: qty 30, 30d supply, fill #2
  Filled 2021-08-02: qty 30, 30d supply, fill #3
  Filled 2021-09-03: qty 30, 30d supply, fill #4
  Filled 2021-10-04: qty 30, 30d supply, fill #5
  Filled 2021-11-06: qty 30, 30d supply, fill #6
  Filled 2021-12-05: qty 30, 30d supply, fill #7
  Filled 2021-12-31: qty 30, 30d supply, fill #8
  Filled 2022-01-31: qty 30, 30d supply, fill #9
  Filled 2022-03-03: qty 30, 30d supply, fill #10

## 2021-04-02 MED ORDER — "INSULIN SYRINGE-NEEDLE U-100 30G X 1/2"" 0.5 ML MISC"
0 refills | Status: DC
  Filled 2021-04-02: qty 100, 30d supply, fill #0

## 2021-04-02 NOTE — Telephone Encounter (Signed)
    Attention TOC pool,  This patient will need a TOC phone call after discharge. They are being discharged today. Follow-up appointment has already been arranged with: Judy Pimple 5/12 They are a patient of Chrystie Nose, MD.  Thank you! Laurann Montana, PA-C

## 2021-04-02 NOTE — Progress Notes (Signed)
CARDIAC REHAB PHASE I   PRE:  Rate/Rhythm: 69 SR  BP:  Supine:   Sitting: 137/74  Standing:    SaO2: 96%RA  MODE:  Ambulation: 470 ft   POST:  Rate/Rhythm: 87 SR  BP:  Supine:   Sitting: 151/79  Standing:    SaO2: 97%RA 0820-0925 Pt walked 470 ft on RA with steady gait and no CP. Tolerated well. MI education completed with pt who voiced understanding. Stressed brilinta with stent. Reviewed NTG use, MI restrictions, walking for ex, gave diabetic and heart healthy diets, and discussed CRP 2. Referred to [place] program.    Luetta Nutting, RN BSN  04/02/2021 9:19 AM

## 2021-04-02 NOTE — Consult Note (Signed)
   [facility] Inpatient Consult   04/02/2021  Tashae Inda 07/10/1971 [id number]   [facility] [[facility]]  Accountable Care Organization [ACO] Patient: [employer]  Assessed for unplanned readmission  Patient was screened for [facility] [[facility]]  Care Management services. Patient will have the transition of care call conducted by the primary care provider. 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  Plan: Came by to speak with patient however she had already transitioned from the hospital.   Please contact for further questions,  Charlesetta Shanks, RN BSN CCM [facility]  [phone] business mobile phone Toll free office [phone]  Fax number: [phone] [email or web address]

## 2021-04-02 NOTE — Discharge Instructions (Signed)
Acute Coronary Syndrome Acute coronary syndrome (ACS) is a serious problem in which there is suddenly not enough blood and oxygen reaching the heart. ACS can result in chest pain or a heart attack. This condition is a medical emergency. If you have any symptoms of this condition, get help right away. What are the causes? This condition may be caused by:  A buildup of fat and cholesterol inside the arteries (atherosclerosis). This is the most common cause. The buildup (plaque) can cause blood vessels in the heart (coronary arteries) to become narrow or blocked, which reduces blood flow to the heart. Plaque can also break off and lead to a clot, which can block an artery and cause a heart attack or stroke.  Sudden tightening of the muscles around the coronary arteries (coronary spasm).  Tearing of a coronary artery (spontaneous coronary artery dissection).  Very low blood pressure (hypotension).  An abnormal heartbeat (arrhythmia).  Other medical conditions that cause a decrease of oxygen to the heart, such as anemiaorrespiratory failure.  Using cocaine or methamphetamine.   What increases the risk? The following factors may make you more likely to develop this condition:  Age. The risk for ACS increases as you get older.  History of chest pain, heart attack, peripheral artery disease, or stroke.  Having taken chemotherapy or immune-suppressing medicines.  Being female.  Family history of chest pain, heart disease, or stroke.  Smoking.  Not exercising enough.  Being overweight.  High cholesterol.  High blood pressure (hypertension).  Diabetes.  Excessive alcohol use. What are the signs or symptoms? Common symptoms of this condition include:  Chest pain. The pain may last a long time, or it may stop and come back (recur). It may feel like: ? Crushing or squeezing. ? Tightness, pressure, fullness, or heaviness.  Arm, neck, jaw, or back pain.  Heartburn or  indigestion.  Shortness of breath.  Nausea.  Sudden cold sweats.  Light-headedness.  Dizziness or passing out.  Tiredness (fatigue). Sometimes there are no symptoms. How is this diagnosed? This condition may be diagnosed based on:  Your medical history and symptoms.  Imaging tests, such as: ? An electrocardiogram (ECG). This measures the heart's electrical activity. ? X-rays. ? CT scan. ? A coronary angiogram. For this test, dye is injected into the heart arteries and then X-rays are taken. ? Myocardial perfusion imaging. This test shows how well blood flows through your heart muscle.  Blood tests. These may be repeated at certain time intervals.  Exercise stress testing.  Echocardiogram. This is a test that uses sound waves to produce detailed images of the heart. How is this treated? Treatment for this condition may include:  Oxygen therapy.  Medicines, such as: ? Antiplatelet medicines and blood-thinning medicines, such as aspirin. These help prevent blood clots. ? Medicine that dissolves any blood clots (fibrinolytic therapy). ? Blood pressure medicines. ? Nitroglycerin. This helps widen blood vessels to improve blood flow. ? Pain medicine. ? Cholesterol-lowering medicine.  Surgery, such as: ? Coronary angioplasty with stent placement. This involves placing a small piece of metal that looks like mesh or a spring into a narrow coronary artery. This widens the artery and keeps it open. ? Coronary artery bypass surgery. This involves taking a section of a blood vessel from a different part of your body and placing it on the blocked coronary artery to allow blood to flow around the blockage.  Cardiac rehabilitation. This is a program that includes exercise training, education, and counseling to help   you recover. Follow these instructions at home: Eating and drinking  Eat a heart-healthy diet that includes whole grains, fruits and vegetables, lean proteins, and  low-fat or nonfat dairy products.  Limit how much salt (sodium) you eat as told by your health care provider. Follow instructions from your health care provider about any other eating or drinking restrictions, such as limiting foods that are high in fat and processed sugars.  Use healthy cooking methods such as roasting, grilling, broiling, baking, poaching, steaming, or stir-frying.  Work with a dietitian to follow a heart-healthy eating plan. Medicines  Take over-the-counter and prescription medicines only as told by your health care provider.  Do not take these medicines unless your health care provider approves: ? Vitamin supplements that contain vitamin A or vitamin E. ? NSAIDs, such as ibuprofen, naproxen, or celecoxib. ? Hormone replacement therapy that contains estrogen.  If you are taking blood thinners: ? Talk with your health care provider before you take any medicines that contain aspirin or NSAIDs. These medicines increase your risk for dangerous bleeding. ? Take your medicine exactly as told, at the same time every day. ? Avoid activities that could cause injury or bruising, and follow instructions about how to prevent falls. ? Wear a medical alert bracelet, and carry a card that lists what medicines you take. Activity  Follow your cardiac rehabilitation program. Do exercises as told by your physical therapist.  Ask your health care provider what activities and exercises are safe for you. Follow his or her instructions about lifting, driving, or climbing stairs. Lifestyle  Do not use any products that contain nicotine or tobacco, such as cigarettes, e-cigarettes, and chewing tobacco. If you need help quitting, ask your health care provider.  Do not drink alcohol if your health care provider tells you not to drink.  If you drink alcohol: ? Limit how much you have to 0-1 drink a day. ? Be aware of how much alcohol is in your drink. In the [place] one drink equals one 12 oz  bottle of beer (355 mL), one 5 oz glass of wine (148 mL), or one 1 oz glass of hard liquor (44 mL).  Maintain a healthy weight. If you need to lose weight, work with your health care provider to do so safely. General instructions  Tell all the health care providers who provide care for you about your heart condition, including your dentist. This may affect the medicines or treatment you receive.  Manage any other health conditions you have, such as hypertension or diabetes. These conditions affect your heart.  Pay attention to your mental health. You may be at higher risk for depression. ? Find ways to manage stress. ? Talk to your health care provider about depression screening and treatment.  Keep your vaccinations up to date. ? Get the flu shot (influenza vaccine) every year. ? Get the pneumococcal vaccine if you are age 65 or older.  If directed, monitor your blood pressure at home.  Keep all follow-up visits as told by your health care provider. This is important. Contact a health care provider if you:  Feel overwhelmed or sad.  Have trouble doing your daily activities. Get help right away if you:  Have pain in your chest, neck, arm, jaw, stomach, or back that recurs, and: ? It lasts for more than a few minutes. ? It is not relieved by taking the medicineyour health care provider prescribed.  Have unexplained: ? Heavy sweating. ? Heartburn or indigestion. ? Nausea   or vomiting. ? Shortness of breath. ? Difficulty breathing. ? Fatigue. ? Nervousness or anxiety. ? Weakness. ? Diarrhea. ? Dark stools or blood in your stool.  Have sudden light-headedness or dizziness.  Have blood pressure that is higher than 180/120.  Faint.  Have thoughts about hurting yourself. These symptoms may represent a serious problem that is an emergency. Do not wait to see if the symptoms will go away. Get medical help right away. Call your local emergency services (911 in the [place] Do  not drive yourself to the hospital.  Summary  Acute coronary syndrome (ACS) is when there is not enough blood and oxygen being supplied to the heart. ACS can result in chest pain or a heart attack.  Acute coronary syndrome is a medical emergency. If you have any symptoms of this condition, get help right away.  Treatment includes medicines and procedures to open the blocked arteries and restore blood flow. This information is not intended to replace advice given to you by your health care provider. Make sure you discuss any questions you have with your health care provider. Document Revised: 04/20/2019 Document Reviewed: 11/29/2018 Elsevier Patient Education  2021 Elsevier Inc.  

## 2021-04-02 NOTE — TOC Progression Note (Signed)
Transition of Care [facility]) - Progression Note    Patient Details  Name: Bonnie Dickson MRN: [id number] Date of Birth: Apr 05, 1971  Transition of Care [facility]) CM/SW Contact  Leone Haven, RN Phone Number: 04/02/2021, 1:45 PM  Clinical Narrative:    TOC is filling the brilinta for patient, per Sheralyn Boatman for refill for the brilinta the copay will be 25.00 , NCM informed patient of this information. They will bring to room prior to dc.        Expected Discharge Plan and Services           Expected Discharge Date: 04/02/21                                     Social Determinants of Health (SDOH) Interventions    Readmission Risk Interventions No flowsheet data found.

## 2021-04-02 NOTE — Discharge Summary (Addendum)
Discharge Summary    Patient ID: Bonnie Dickson MRN: [id number]; DOB: [birth year]  Admit date: 03/31/2021 Discharge date: 04/02/2021  PCP:  Bradd Canary, MD   [facility] Providers Cardiologist:  Chrystie Nose, MD        Discharge Diagnoses    Principal Problem:   NSTEMI (non-ST elevated myocardial infarction) [facility]) Active Problems:   Hyperlipidemia LDL goal <70   Hypertension   CAD (coronary artery disease)   Uncontrolled diabetes mellitus (HCC)   Abnormal TSH   Ischemic cardiomyopathy   Diagnostic Studies/Procedures     2D echo 03/26/21 (PRIOR TO THIS ADMISSION) 1. Left ventricular ejection fraction, by estimation, is 60 to 65%. The  left ventricle has normal function. The left ventricle has no regional  wall motion abnormalities. Left ventricular diastolic parameters are  consistent with Grade II diastolic  dysfunction (pseudonormalization). Elevated left atrial pressure.  2. Right ventricular systolic function is normal. The right ventricular  size is normal. There is normal pulmonary artery systolic pressure.  3. Left atrial size was mildly dilated.  4. The mitral valve is normal in structure. Trivial mitral valve  regurgitation. No evidence of mitral stenosis.  5. The aortic valve is normal in structure. Aortic valve regurgitation is  not visualized. No aortic stenosis is present.  6. The inferior vena cava is normal in size with greater than 50%  respiratory variability, suggesting right atrial pressure of 3 mmHg.   LHC 04/01/21  Culprit Lesion: Ost LAD to Prox LAD lesion is 95% stenosed.  A drug-eluting stent was successfully placed using a SYNERGY XD 2.75X16. -> Postdilated in tapered fashion from 4.1 down to 3.0 mm  Post intervention, there is a 0% residual stenosis.  Dist LAD lesion is 30% stenosed.  ------------------------------  Prox Cx to Mid Cx lesion is 30% stenosed.  Prox RCA lesion is 50% stenosed.  RPDA lesion is 60%  stenosed.  ---------------------------  There is mild left ventricular systolic dysfunction. The left ventricular ejection fraction is 45-50% by visual estimate -> mid to apical anterior hypokinesis  LV end diastolic pressure is mildly elevated.   SUMMARY  Two-vessel CAD: Culprit lesion is severe 95% ostial-proximal LAD-eccentric stenosis; also noted moderate 50% proximal RCA and 60% ostial PDA. ? Successful DES PCI of ostial proximal LAD using a synergy DES 2.75 mm x 16 mm postdilated and taper fashion from 4.1 to 3.0 mm.  Mildly reduced EF with mid apical anterior hypokinesis. ? Systemic Hypertension with mild to moderately elevated LVEDP.  RECOMMENDATION  Return to nursing unit for ongoing care and TR band removal.  Aggressive blood pressure management-wean off nitroglycerin.  High-dose statin  [facility] 1 consult  Anticipate discharge tomorrow, if medically stable.  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 Recently admitted 03/2021 with atypical chest pain and low/flat troponin elevation, felt due to hypertensive urgency.  She was readmitted 03/31/2021 with persistent chest pain (sharp, burning, aching) and found to have troponin elevation [phone] felt consistent with NSTEMI.She was placed on ASA, heparin, BB and NTG drip in addition to home amlodipine and losartan.   Hospital Course     1. Chest pain/NSTEMI - underwent cardiac cath 04/01/21 (with IV dye premedication) demonstrating 95% ostial-prox LAD s/p PCI/DES, also with moderate 50% prox RCA and 60% ostial PDA for medical therapy -  continue new ASA/Brilinta at discharge - spoke with pharmacy team - copay submitted for Brilinta and will be $24.99/mo, can continue to discuss with patient as OP whether this  is feasible - has prior intolerance of ASA d/t rectal bleeding but tolerated well inpatient without adverse effect, continue to monitor as OP - metoprolol started this admission and changed to carvedilol to improve BP control - home atorvastatin titrated  2. HTN - on amlodipine, losartan PTA - amlodipine titrated to 10mg  daily - metoprolol started this admission and changed to carvedilol to improve BP control - continue home ARB - the patient was instructed to monitor their blood pressure at home and to call if tending to run higher than 130/80  3. Ischemic cardiomyopathy - EF 45-50% by cath (was normal by echo 03/2021) - continue BB, ARB - volume status looks fine, provided reminders on AVS for lifestyle modification  4. Uncontrolled DM with hyperglycemia - A1C 9.5, on Novolog PTA - intolerant of metformin due to GI side effects - continue SSI and encourage OP f/u - Na 133 today likely pseudohyponatremia due to elevated CBGs - per IDDM coordinator provided rx for insulin syringes order [phone] at dc, advised to f/u PCP for refills - copay check ran for Farxiga/Jardiance and both require prior authorization- will defer to OP setting to ensure she tolerates the initial med changes above made without clouding the picture  5. Hyperlipidemia - on atorvastatin 40mg  PTA with LDL of 82 - titrated to 80mg  daily - If the patient is tolerating statin at time of follow-up appointment, would consider rechecking liver function/lipid panel in 6-8 weeks.  6. Leukocytosis - WBC elevated today, likely reactive to steroid pre-med  7. Abnormal TSH 0.326 - checked FT4 this AM, normal, may be reactive then - recommended to f/u PCP for recheck in a few weeks  The patient feels well this morning. She ambulated well with cardiac rehab. Dr. Jacques Navy. has seen and examined the patient today and feels she is stable for discharge. Instructions are outlined below. She was given a work note to RTW in 1  week.   Did the patient have an acute coronary syndrome (MI, NSTEMI, STEMI, etc) this admission?:  Yes                               AHA/ACC Clinical Performance & Quality Measures: 3. Aspirin prescribed? - Yes 4. ADP Receptor Inhibitor (Plavix/Clopidogrel, Brilinta/Ticagrelor or Effient/Prasugrel) prescribed (includes medically managed patients)? - Yes 5. Beta Blocker prescribed? - Yes 6. High Intensity Statin (Lipitor 40-80mg  or Crestor 20-40mg ) prescribed? - Yes 7. EF assessed during THIS hospitalization? - Yes 8. For EF <40%, was ACEI/ARB prescribed? - Not Applicable (EF >/= 40%) 9. For EF <40%, Aldosterone Antagonist (Spironolactone or Eplerenone) prescribed? - Not Applicable (EF >/= 40%) 10. Cardiac Rehab Phase II ordered (including medically managed patients)? - Yes       _____________  Discharge Vitals Blood pressure (!) 151/79, pulse 76, temperature 98 F (36.7 C), temperature source Oral, resp. rate 19, height 5\' 7"  (1.702 m), weight 91.5 kg, SpO2 98 %.  Filed Weights   03/31/21 1732 04/01/21 0605 04/02/21 0414  Weight: 91.4 kg 92.6 kg 91.5 kg    Labs & Radiologic Studies    CBC Recent Labs    04/01/21 0122 04/02/21 0222  WBC 10.2 15.9*  HGB 14.1 13.4  HCT 42.0 39.8  MCV 95.9 93.6  PLT 333 344  Basic Metabolic Panel Recent Labs    74/25/95 1617 04/01/21 0122 04/02/21 0222  NA  --  136 133*  K  --  4.6 3.8  CL  --  100 101  CO2  --  23 22  GLUCOSE  --  185* 290*  BUN  --  16 13  CREATININE  --  0.99 0.71  CALCIUM  --  9.5 9.4  MG 1.7  --   --    Liver Function Tests No results for input(s): AST, ALT, ALKPHOS, BILITOT, PROT, ALBUMIN in the last 72 hours. No results for input(s): LIPASE, AMYLASE in the last 72 hours. High Sensitivity Troponin:   Recent Labs  Lab 03/25/21 2006 03/25/21 2359 03/31/21 1240 03/31/21 1425 03/31/21 1730  TROPONINIHS 32* 29* 500* 799* 1,057*    BNP Invalid input(s): POCBNP D-Dimer No results for input(s):  DDIMER in the last 72 hours. Hemoglobin A1C Recent Labs    03/31/21 1240  HGBA1C 9.5*   Fasting Lipid Panel Recent Labs    04/01/21 0649  CHOL 130  HDL 39*  LDLCALC 82  TRIG 44  CHOLHDL 3.3   Thyroid Function Tests Recent Labs    04/02/21 0222  TSH 0.326*   _____________  DG Chest 2 View  Result Date: 03/31/2021 CLINICAL DATA:  Chest pain, shortness breath, nausea and vomiting for 8 days. EXAM: CHEST - 2 VIEW COMPARISON:  Chest x-ray dated 03/25/2021. FINDINGS: Heart size and mediastinal contours are within normal limits. Lungs are clear. No pleural effusion or pneumothorax is seen. Osseous structures about the chest are unremarkable. IMPRESSION: No active cardiopulmonary disease.  No evidence of pneumonia. Electronically Signed   By: Bary Richard M.D.   On: 03/31/2021 13:54   DG Chest 2 View  Result Date: 03/25/2021 CLINICAL DATA:  50 year old female with chest pain. EXAM: CHEST - 2 VIEW COMPARISON:  None. FINDINGS: The heart size and mediastinal contours are within normal limits. Both lungs are clear. The visualized skeletal structures are unremarkable. IMPRESSION: No active cardiopulmonary disease. Electronically Signed   By: Elgie Collard M.D.   On: 03/25/2021 17:53   CARDIAC CATHETERIZATION  Result Date: 04/01/2021  Culprit Lesion: Ost LAD to Prox LAD lesion is 95% stenosed.  A drug-eluting stent was successfully placed using a SYNERGY XD 2.75X16. -> Postdilated in tapered fashion from 4.1 down to 3.0 mm  Post intervention, there is a 0% residual stenosis.  Dist LAD lesion is 30% stenosed.  ------------------------------  Prox Cx to Mid Cx lesion is 30% stenosed.  Prox RCA lesion is 50% stenosed.  RPDA lesion is 60% stenosed.  ---------------------------  There is mild left ventricular systolic dysfunction. The left ventricular ejection fraction is 45-50% by visual estimate -> mid to apical anterior hypokinesis  LV end diastolic pressure is mildly elevated.   SUMMARY  Two-vessel CAD: Culprit lesion is severe 95% ostial-proximal LAD-eccentric stenosis; also noted moderate 50% proximal RCA and 60% ostial PDA.  Successful DES PCI of ostial proximal LAD using a synergy DES 2.75 mm x 16 mm postdilated and taper fashion from 4.1 to 3.0 mm.  Mildly reduced EF with mid apical anterior hypokinesis.  Systemic Hypertension with mild to moderately elevated LVEDP. RECOMMENDATION  Return to nursing unit for ongoing care and TR band removal.  Aggressive blood pressure management-wean off nitroglycerin.  High-dose statin  [facility] 1 consult Anticipate discharge tomorrow, if medically stable.  Bryan Lemma, MD   DG Chest Port 1 View  Result Date: 03/31/2021 CLINICAL DATA:  Chest  pain for 8 days, nausea and vomiting, tobacco abuse EXAM: PORTABLE CHEST 1 VIEW COMPARISON:  03/31/2021 FINDINGS: Single frontal view of the chest demonstrates an unremarkable cardiac silhouette. Chronic interstitial prominence consistent with scarring and tobacco abuse. No airspace disease, effusion, or pneumothorax. No acute bony abnormalities. IMPRESSION: 1. No acute intrathoracic process. Electronically Signed   By: Sharlet SalinaMichael  Brown M.D.   On: 03/31/2021 15:46   ECHOCARDIOGRAM COMPLETE  Result Date: 03/26/2021    ECHOCARDIOGRAM REPORT   Patient Name:   Kyra SearlesRECINDA Dubie Date of Exam: 03/26/2021 Medical Rec #:  [id number]        Height:       67.0 in Accession #:    [id number]       Weight:       203.3 lb Date of Birth:  [birth year]        BSA:          2.036 m Patient Age:    49 years         BP:           163/80 mmHg Patient Gender: F                HR:           62 bpm. Exam Location:  Inpatient Procedure: 2D Echo, Cardiac Doppler and Color Doppler Indications:    Chest pain  History:        Patient has no prior history of Echocardiogram examinations.                 Risk Factors:Hypertension, Dyslipidemia and Diabetes.  Sonographer:    Gertie FeyMichelle Simonetti MHA, RDMS, RVT, RDCS Referring Phys: [id number]  Cecille PoLEXANDER [facility] Center[facility]ELVIN  Sonographer Comments: Image acquisition challenging due to patient body habitus. IMPRESSIONS  1. Left ventricular ejection fraction, by estimation, is 60 to 65%. The left ventricle has normal function. The left ventricle has no regional wall motion abnormalities. Left ventricular diastolic parameters are consistent with Grade II diastolic dysfunction (pseudonormalization). Elevated left atrial pressure.  2. Right ventricular systolic function is normal. The right ventricular size is normal. There is normal pulmonary artery systolic pressure.  3. Left atrial size was mildly dilated.  4. The mitral valve is normal in structure. Trivial mitral valve regurgitation. No evidence of mitral stenosis.  5. The aortic valve is normal in structure. Aortic valve regurgitation is not visualized. No aortic stenosis is present.  6. The inferior vena cava is normal in size with greater than 50% respiratory variability, suggesting right atrial pressure of 3 mmHg. FINDINGS  Left Ventricle: Left ventricular ejection fraction, by estimation, is 60 to 65%. The left ventricle has normal function. The left ventricle has no regional wall motion abnormalities. The left ventricular internal cavity size was normal in size. There is  no left ventricular hypertrophy. Left ventricular diastolic parameters are consistent with Grade II diastolic dysfunction (pseudonormalization). Elevated left atrial pressure. Right Ventricle: The right ventricular size is normal. No increase in right ventricular wall thickness. Right ventricular systolic function is normal. There is normal pulmonary artery systolic pressure. The tricuspid regurgitant velocity is 1.33 m/s, and  with an assumed right atrial pressure of 3 mmHg, the estimated right ventricular systolic pressure is 10.1 mmHg. Left Atrium: Left atrial size was mildly dilated. Right Atrium: Right atrial size was normal in size. Pericardium: There is no evidence of pericardial  effusion. Mitral Valve: The mitral valve is normal in structure. Trivial mitral valve regurgitation. No evidence of mitral valve stenosis. Tricuspid  Valve: The tricuspid valve is normal in structure. Tricuspid valve regurgitation is not demonstrated. No evidence of tricuspid stenosis. Aortic Valve: The aortic valve is normal in structure. Aortic valve regurgitation is not visualized. No aortic stenosis is present. Aortic valve mean gradient measures 5.0 mmHg. Aortic valve peak gradient measures 8.9 mmHg. Aortic valve area, by VTI measures 2.14 cm. Pulmonic Valve: The pulmonic valve was normal in structure. Pulmonic valve regurgitation is not visualized. No evidence of pulmonic stenosis. Aorta: The aortic root is normal in size and structure. Venous: The inferior vena cava is normal in size with greater than 50% respiratory variability, suggesting right atrial pressure of 3 mmHg. IAS/Shunts: No atrial level shunt detected by color flow Doppler.  LEFT VENTRICLE PLAX 2D LVIDd:         4.60 cm     Diastology LVIDs:         2.80 cm     LV e' medial:    5.98 cm/s LV PW:         1.00 cm     LV E/e' medial:  16.1 LV IVS:        0.80 cm     LV e' lateral:   5.98 cm/s LVOT diam:     1.90 cm     LV E/e' lateral: 16.1 LV SV:         62 LV SV Index:   30 LVOT Area:     2.84 cm  LV Volumes (MOD) LV vol d, MOD A2C: 69.0 ml LV vol d, MOD A4C: 72.7 ml LV vol s, MOD A2C: 24.6 ml LV vol s, MOD A4C: 30.6 ml LV SV MOD A2C:     44.4 ml LV SV MOD A4C:     72.7 ml LV SV MOD BP:      43.3 ml RIGHT VENTRICLE RV S prime:     11.90 cm/s TAPSE (M-mode): 2.4 cm LEFT ATRIUM             Index       RIGHT ATRIUM           Index LA diam:        4.11 cm 2.02 cm/m  RA Area:     12.90 cm LA Vol (A2C):   68.8 ml 33.79 ml/m RA Volume:   29.90 ml  14.68 ml/m LA Vol (A4C):   58.6 ml 28.78 ml/m LA Biplane Vol: 63.7 ml 31.28 ml/m  AORTIC VALVE AV Area (Vmax):    2.17 cm AV Area (Vmean):   2.31 cm AV Area (VTI):     2.14 cm AV Vmax:            149.00 cm/s AV Vmean:          102.000 cm/s AV VTI:            0.290 m AV Peak Grad:      8.9 mmHg AV Mean Grad:      5.0 mmHg LVOT Vmax:         114.00 cm/s LVOT Vmean:        83.100 cm/s LVOT VTI:          0.219 m LVOT/AV VTI ratio: 0.76  AORTA Ao Root diam: 3.00 cm MITRAL VALVE               TRICUSPID VALVE MV Area (PHT): 2.42 cm    TR Peak grad:   7.1 mmHg MV Decel Time: 313 msec    TR Vmax:  133.00 cm/s MV E velocity: 96.00 cm/s MV A velocity: 93.00 cm/s  SHUNTS MV E/A ratio:  1.03        Systemic VTI:  0.22 m                            Systemic Diam: 1.90 cm Rachelle Hora Croitoru MD Electronically signed by Thurmon Fair MD Signature Date/Time: 03/26/2021/11:02:39 AM    Final    Disposition   Pt is being discharged home today in good condition.  Follow-up Plans & Appointments     Follow-up Information    Beatriz Stallion., PA-C Follow up.   Specialties: Physician Assistant, Cardiology Why: [facility] (Cardiology) - [place] [facility] - a follow-up has been made for you on Thursday Apr 11, 2021 11:45 AM (Arrive by 11:30 AM). Dot Lanes is one of the PAs that works with our cardiology team. Contact information: [address] [place] [address] [place] [address] [phone]        Bradd Canary, MD Follow up.   Specialty: Family Medicine Why: Please make an appointment ASAP to see your primary care provider to discuss diabetes management. Contact information: [address] [place] [address] [phone]              Discharge Instructions    Amb Referral to Cardiac Rehabilitation   Complete by: As directed    Diagnosis:  Coronary Stents NSTEMI     After initial evaluation and assessments completed: Virtual Based Care may be provided alone or in conjunction with Phase 2 Cardiac Rehab based on patient barriers.: Yes   Diet - low sodium heart healthy   Complete by: As directed    Discharge instructions   Complete by: As directed    MEDICINE  INFORMATION: - You were started on two blood thinners, aspirin and Brilinta (ticagrelor). If you notice any bleeding such as blood in stool, black tarry stools, blood in urine, nosebleeds or any other unusual bleeding, call your doctor immediately. It is not normal to have this kind of bleeding while on a blood thinner and usually indicates there is an underlying problem with one of your body systems that needs to be checked out.  - Your amlodipine and atorvastatin doses were increased - new prescription given. - You were started on a blood pressure/heart medicine called carvedilol. - We also sent in a refill of your insulin syringes, but please follow up with primary care for further refills and management of your diabetes. - We also supplied a prescription for as-needed nitroglycerin.  ADDITIONAL INSTRUCTIONS: Your TSH was slightly low, but your free T4 thyroid hormone came back normal. Please follow up with primary care provider for recheck in a few weeks.  Please monitor your blood pressure occasionally at home. Call your doctor if you tend to get readings of greater than 130 on the top number or 80 on the bottom number.  One of your heart tests showed mild weakness of the heart muscle this admission. (Your ejection fraction by heart cath was 45-50% and normal is 55-60%). This may make you more susceptible to weight gain from fluid retention, which can lead to symptoms that we call heart failure. Please follow these special instructions:  1. Follow a low-salt diet - you are allowed no more than 2,000mg  of sodium per day. Watch your fluid intake. In general, you should not be taking in more than 2 liters of fluid per day (  no more than 8 glasses per day). This includes sources of water in foods like soup, coffee, tea, milk, etc. 2. Weigh yourself on the same scale at same time of day and keep a log. 3. Call your doctor: (Anytime you feel any of the following symptoms)  - 3lb weight gain overnight  or 5lb within a few days - Shortness of breath, with or without a dry hacking cough  - Swelling in the hands, feet or stomach  - If you have to sleep on extra pillows at night in order to breathe   IT IS IMPORTANT TO LET YOUR DOCTOR KNOW EARLY ON IF YOU ARE HAVING SYMPTOMS SO WE CAN HELP YOU!   Increase activity slowly   Complete by: As directed    No driving for 1 week. No lifting over 10 lbs for 2 weeks. No sexual activity for 2 weeks. You may return to work in 1 week (on 04/09/21). Keep procedure site clean & dry. If you notice increased pain, swelling, bleeding or pus, call/return!  You may shower, but no soaking baths/hot tubs/pools for 1 week.      Discharge Medications   Allergies as of 04/02/2021      Reactions   Ciprofloxacin Hcl Anaphylaxis   Gentamicin Anaphylaxis   Penicillins Anaphylaxis   Ambien [zolpidem Tartrate] Other (See Comments)   Sleep walking   Asa [aspirin] Other (See Comments)   Rectal bleeding   Banana Itching, Swelling, Other (See Comments)   Tongue itches and swells- breathing not impaired   Ivp Dye [iodinated Diagnostic Agents] Nausea And Vomiting, Other (See Comments)   Severe vomiting   Lisinopril Cough   Metformin And Related Nausea Only, Other (See Comments)   Severe GI upset   Nsaids Other (See Comments)   Rectal bleeding   Pineapple Other (See Comments)   Causes a burning sensation in the mouth   Tape Other (See Comments)   Paper tape "burns the skin"   Codeine Rash   Flagyl [metronidazole] Rash   Morphine And Related Rash      Medication List    TAKE these medications   amLODipine 10 MG tablet Commonly known as: NORVASC Take 1 tablet (10 mg total) by mouth daily. Start taking on: Apr 03, 2021 What changed:   medication strength  how much to take   aspirin 81 MG EC tablet Take 1 tablet (81 mg total) by mouth daily. Swallow whole. Start taking on: Apr 03, 2021   atorvastatin 80 MG tablet Commonly known as: LIPITOR Take 1 tablet  (80 mg total) by mouth every evening. What changed:   medication strength  how much to take  when to take this   carvedilol 6.25 MG tablet Commonly known as: COREG Take 1 tablet (6.25 mg total) by mouth 2 (two) times daily with a meal.   Insulin Syringe-Needle U-100 30G X 1/2" 0.5 ML Misc use as directed with insulin   losartan 50 MG tablet Commonly known as: COZAAR Take 1 tablet by mouth once daily What changed: when to take this   nitroGLYCERIN 0.4 MG SL tablet Commonly known as: Nitrostat Place 1 tablet (0.4 mg total) under the tongue every 5 (five) minutes as needed for chest pain (up to 3 doses).   NovoLOG ReliOn 100 UNIT/ML injection Generic drug: insulin aspart Inject 0-10 Units into the skin See admin instructions. Inject 0-10 units into the skin up to four times a day, PER SLIDING SCALE   ticagrelor 90 MG Tabs tablet  Commonly known as: BRILINTA Take 1 tablet (90 mg total) by mouth 2 (two) times daily.   valACYclovir 500 MG tablet Commonly known as: VALTREX Take 1 tablet (500 mg total) by mouth 2 (two) times daily.          Outstanding Labs/Studies   If the patient is tolerating statin at time of follow-up appointment, would consider rechecking liver function/lipid panel in 6-8 weeks.  Duration of Discharge Encounter   Greater than 30 minutes including physician time.  Signed, Laurann Montana, PA-C 04/02/2021, 12:47 PM  Patient seen and examined with Ronie Spies PA-C.  Agree as above, with the following exceptions and changes as noted below. No CP or SOB. Gen: NAD, CV: RRR, no murmurs, Lungs: clear, Abd: soft, Extrem: Warm, well perfused, no edema, Neuro/Psych: alert and oriented x 3, normal mood and affect. All available labs, radiology testing, previous records reviewed.  - will transition metoprolol tartrate to carvedilol, and can uptitrate as outpatient. - continue ASA, Brilinta. I have instructed the patient that dual antiplatelet therapy should be taken  for 1 year without interruption.  We have discussed the consequences of interrupted dual antiplatelet therapy and the risk for in-stent thrombosis.  - continue statin.  - continue ARB, will likely need titration as outpatient. - continue amlodipine.    Parke Poisson, MD

## 2021-04-02 NOTE — Progress Notes (Addendum)
Progress Note  Patient Name: Bonnie Dickson Date of Encounter: 04/02/2021  Primary Cardiologist: Chrystie Nose, MD  Subjective   Had dyspnea with Brilinta loading dose but no issue with PM dose. No chest pain. She is a Physiological scientist.  Inpatient Medications    Scheduled Meds: . amLODipine  10 mg Oral Daily  . aspirin EC  81 mg Oral Daily  . atorvastatin  80 mg Oral q1800  . insulin aspart  0-5 Units Subcutaneous QHS  . insulin aspart  0-9 Units Subcutaneous TID WC  . losartan  50 mg Oral Daily  . metoprolol tartrate  25 mg Oral BID  . polyethylene glycol  17 g Oral Daily  . sodium chloride flush  3 mL Intravenous Q12H  . sodium chloride flush  3 mL Intravenous Q12H  . ticagrelor  90 mg Oral BID  . valACYclovir  500 mg Oral BID   Continuous Infusions: . sodium chloride    . sodium chloride    . nitroGLYCERIN Stopped (04/01/21 1107)   PRN Meds: sodium chloride, acetaminophen, docusate sodium, ondansetron (ZOFRAN) IV, sodium chloride flush   Vital Signs    Vitals:   04/01/21 1751 04/01/21 1952 04/01/21 2126 04/02/21 0414  BP: (!) 148/85 (!) 155/67 (!) 160/79 (!) 150/78  Pulse:  88 96 70  Resp:  20  19  Temp:  98.7 F (37.1 C)  98.5 F (36.9 C)  TempSrc:  Oral  Oral  SpO2:  98%  96%  Weight:    91.5 kg  Height:        Intake/Output Summary (Last 24 hours) at 04/02/2021 0806 Last data filed at 04/01/2021 1757 Gross per 24 hour  Intake 686.74 ml  Output --  Net 686.74 ml   Last 3 Weights 04/02/2021 04/01/2021 03/31/2021  Weight (lbs) 201 lb 11.5 oz 204 lb 3.2 oz 201 lb 6.4 oz  Weight (kg) 91.5 kg 92.625 kg 91.354 kg     Telemetry    NSR - Personally Reviewed  ECG    NSR 63bpm, nonspecific STTW changes - Personally Reviewed  Physical Exam   GEN: No acute distress.  HEENT: Normocephalic, atraumatic, sclera non-icteric. Neck: No JVD or bruits. Cardiac: RRR no murmurs, rubs, or gallops.  Respiratory: Clear to auscultation bilaterally. Breathing is  unlabored. GI: Soft, nontender, non-distended, BS +x 4. MS: no deformity. Extremities: No clubbing or cyanosis. No edema. Distal pedal pulses are 2+ and equal bilaterally. Right radial cath site without hematoma or ecchymosis; good pulse. Neuro:  AAOx3. Follows commands. Psych:  Responds to questions appropriately with a normal affect.  Labs    High Sensitivity Troponin:   Recent Labs  Lab 03/25/21 2006 03/25/21 2359 03/31/21 1240 03/31/21 1425 03/31/21 1730  TROPONINIHS 32* 29* 500* 799* 1,057*      Cardiac EnzymesNo results for input(s): TROPONINI in the last 168 hours. No results for input(s): TROPIPOC in the last 168 hours.   Chemistry Recent Labs  Lab 03/31/21 1240 04/01/21 0122 04/02/21 0222  NA 133* 136 133*  K 3.9 4.6 3.8  CL 99 100 101  CO2 24 23 22   GLUCOSE 222* 185* 290*  BUN 10 16 13   CREATININE 0.69 0.99 0.71  CALCIUM 9.8 9.5 9.4  GFRNONAA >60 >60 >60  ANIONGAP 10 13 10      Hematology Recent Labs  Lab 03/31/21 1240 04/01/21 0122 04/02/21 0222  WBC 10.0 10.2 15.9*  RBC 4.92 4.38 4.25  HGB 15.7* 14.1 13.4  HCT 47.1* 42.0 39.8  MCV 95.7 95.9 93.6  MCH 31.9 32.2 31.5  MCHC 33.3 33.6 33.7  RDW 12.5 12.8 12.3  PLT 367 333 344    BNPNo results for input(s): BNP, PROBNP in the last 168 hours.   DDimer No results for input(s): DDIMER in the last 168 hours.   Radiology    DG Chest 2 View  Result Date: 03/31/2021 CLINICAL DATA:  Chest pain, shortness breath, nausea and vomiting for 8 days. EXAM: CHEST - 2 VIEW COMPARISON:  Chest x-ray dated 03/25/2021. FINDINGS: Heart size and mediastinal contours are within normal limits. Lungs are clear. No pleural effusion or pneumothorax is seen. Osseous structures about the chest are unremarkable. IMPRESSION: No active cardiopulmonary disease.  No evidence of pneumonia. Electronically Signed   By: Bary Richard M.D.   On: 03/31/2021 13:54   CARDIAC CATHETERIZATION  Result Date: 04/01/2021  Culprit Lesion: Ost  LAD to Prox LAD lesion is 95% stenosed.  A drug-eluting stent was successfully placed using a SYNERGY XD 2.75X16. -> Postdilated in tapered fashion from 4.1 down to 3.0 mm  Post intervention, there is a 0% residual stenosis.  Dist LAD lesion is 30% stenosed.  ------------------------------  Prox Cx to Mid Cx lesion is 30% stenosed.  Prox RCA lesion is 50% stenosed.  RPDA lesion is 60% stenosed.  ---------------------------  There is mild left ventricular systolic dysfunction. The left ventricular ejection fraction is 45-50% by visual estimate -> mid to apical anterior hypokinesis  LV end diastolic pressure is mildly elevated.  SUMMARY  Two-vessel CAD: Culprit lesion is severe 95% ostial-proximal LAD-eccentric stenosis; also noted moderate 50% proximal RCA and 60% ostial PDA.  Successful DES PCI of ostial proximal LAD using a synergy DES 2.75 mm x 16 mm postdilated and taper fashion from 4.1 to 3.0 mm.  Mildly reduced EF with mid apical anterior hypokinesis.  Systemic Hypertension with mild to moderately elevated LVEDP. RECOMMENDATION  Return to nursing unit for ongoing care and TR band removal.  Aggressive blood pressure management-wean off nitroglycerin.  High-dose statin  CRH 1 consult Anticipate discharge tomorrow, if medically stable.  Bryan Lemma, MD   DG Chest Port 1 View  Result Date: 03/31/2021 CLINICAL DATA:  Chest pain for 8 days, nausea and vomiting, tobacco abuse EXAM: PORTABLE CHEST 1 VIEW COMPARISON:  03/31/2021 FINDINGS: Single frontal view of the chest demonstrates an unremarkable cardiac silhouette. Chronic interstitial prominence consistent with scarring and tobacco abuse. No airspace disease, effusion, or pneumothorax. No acute bony abnormalities. IMPRESSION: 1. No acute intrathoracic process. Electronically Signed   By: Sharlet Salina M.D.   On: 03/31/2021 15:46    Cardiac Studies    2D echo 03/26/21 1. Left ventricular ejection fraction, by estimation, is 60 to 65%.  The  left ventricle has normal function. The left ventricle has no regional  wall motion abnormalities. Left ventricular diastolic parameters are  consistent with Grade II diastolic  dysfunction (pseudonormalization). Elevated left atrial pressure.  2. Right ventricular systolic function is normal. The right ventricular  size is normal. There is normal pulmonary artery systolic pressure.  3. Left atrial size was mildly dilated.  4. The mitral valve is normal in structure. Trivial mitral valve  regurgitation. No evidence of mitral stenosis.  5. The aortic valve is normal in structure. Aortic valve regurgitation is  not visualized. No aortic stenosis is present.  6. The inferior vena cava is normal in size with greater than 50%  respiratory variability, suggesting right atrial pressure of 3 mmHg.  Patient Profile     50 y.o. female with hypertension, hyperlipidemia, IV dye allergy, type 2 diabetes, irritable bowel syndrome, situational depression, HSV, endometriosis. Recently admitted 03/2021 with atypical chest pain and low/flat troponin elevation, felt due to hypertensive urgency.  Readmitted 03/31/2021 with persistent chest pain (sharp, burning, aching) and found to have troponin elevation [phone].   Assessment & Plan    1. Chest pain/NSTEMI - cardiac cath 04/01/21 demonstrating 95% ostial-prox LAD s/p PCI/DES, also with moderate 50% prox RCA and 60% ostial PDA for medical therapy - continue ASA, titrated statin, will discuss beta blocker plan with MD, Marden Noble - spoke with pharmacy team, they will see her for education - copay submitted for Brilinta and will be $24.99/mo  2. HTN - remains elevated at times - on amlodipine, losartan PTA, also started on Lopressor this admission - amlodipine titrated yesterday PM - will discuss discharge regimen with MD - consideration could be given to titrating losartan vs changing Lopressor to carvedilol instead  3. Ischemic  cardiomyopathy - EF 45-50% by cath - anticipate continuation of BB, ARB - volume status looks fine  4. Uncontrolled DM with hyperglycemia - A1C 9.5, on Novolog PTA - intolerant of metformin due to GI side effects - continue SSI and encourage OP f/u - Na 133 today likely pseudohyponatremia due to elevated CBGs - per IDDM coordinator will need 50 units insulin syringes order [phone] at dc - copay check ran for Farxiga/Jardiance and both require prior authorization, will review with MD but may be best to defer to OP setting to ensure she tolerates the initial med changes made without clouding the picture  5. Hyperlipidemia - on atorvastatin 40mg  PTA with LDL of 82 - will titrate to 80mg  daily - consider rechecking liver function/lipid panel in 6-8 weeks.  6. Leukocytosis - likely reactive to steroid pre-med  7. Abnormal TSH - checked FT4 this AM, normal, may be reactive then - f/u PCP for recheck in a few weeks  Pt will plan to use [facility] pharmacy per our discussion. Anticipate DC this afternoon if BP OK. Cardiac rehab to see. Will review with MD when she can return to work (works as - traditional NSTEMI instructions state "No driving for 1 week. No lifting over 10 lbs for 2 weeks. No sexual activity for 2 weeks" but return to work is variable.   For questions or updates, please contact [facility] Please consult [email or web address] for contact info under Cardiology/STEMI.  Signed, [facility], PA-C 04/02/2021, 8:06 AM    Patient seen and examined with Laurann Montana PA-C.  Agree as above, with the following exceptions and changes as noted below. No CP or SOB. Gen: NAD, CV: RRR, no murmurs, Lungs: clear, Abd: soft, Extrem: Warm, well perfused, no edema, Neuro/Psych: alert and oriented x 3, normal mood and affect. All available labs, radiology testing, previous records reviewed.  - will transition metoprolol tartrate to carvedilol, and can uptitrate as outpatient. - continue  ASA, Brilinta. I have instructed the patient that dual antiplatelet therapy should be taken for 1 year without interruption.  We have discussed the consequences of interrupted dual antiplatelet therapy and the risk for in-stent thrombosis.  - continue statin.  - continue ARB, will likely need titration as outpatient. - continue amlodipine.   06/02/2021, MD 04/02/21 8:46 AM

## 2021-04-03 ENCOUNTER — Other Ambulatory Visit: Payer: Self-pay

## 2021-04-03 ENCOUNTER — Telehealth: Payer: Self-pay

## 2021-04-03 DIAGNOSIS — I214 Non-ST elevation (NSTEMI) myocardial infarction: Secondary | ICD-10-CM

## 2021-04-03 NOTE — Telephone Encounter (Signed)
Called pt for [facility] phone call. No answer. Unable to leave msg d/t voicemail being full.

## 2021-04-03 NOTE — Chronic Care Management (AMB) (Signed)
  Care Management   Outreach Note  04/03/2021 Name: Bonnie Dickson MRN: [id number] DOB: December 16, 1970  Referred by: Bradd Canary, MD Reason for referral : Care Coordination (Outreach to schedule referral with RN CM )   An unsuccessful telephone outreach was attempted today. The patient was referred to the case management team for assistance with care management and care coordination.   Follow Up Plan: The care management team will reach out to the patient again over the next 7 days.  If patient returns call to provider office, please advise to call Embedded Care Management Care Guide Penne Lash  at [phone]  Penne Lash, RMA Care Guide, Embedded Care Coordination [facility]  [place], [place] [address] Direct Dial: [phone] [email or web address] Website: [email or web address]

## 2021-04-05 ENCOUNTER — Telehealth (HOSPITAL_BASED_OUTPATIENT_CLINIC_OR_DEPARTMENT_OTHER): Payer: Self-pay

## 2021-04-05 ENCOUNTER — Other Ambulatory Visit (HOSPITAL_COMMUNITY): Payer: Self-pay

## 2021-04-05 ENCOUNTER — Telehealth: Payer: Self-pay | Admitting: *Deleted

## 2021-04-05 MED ORDER — PEN NEEDLES 32G X 4 MM MISC: 1 refills | Status: DC

## 2021-04-05 MED ORDER — INSULIN LISPRO (1 UNIT DIAL) 100 UNIT/ML (KWIKPEN): PEN_INJECTOR | SUBCUTANEOUS | 1 refills | Status: DC

## 2021-04-05 NOTE — Telephone Encounter (Addendum)
Pharmacy sent over a prior auth stating insurance is not covering the Novolog.  Alternatives that are covered are:  Humalog Humalog KwikPen Insulin Aspart (generic Novolog) Insulin Aspart Flexpen (generic Novolog)

## 2021-04-05 NOTE — Telephone Encounter (Signed)
Patient contacted regarding discharge from [facility] on 04/02/21.  Patient understands to follow up with provider Judy Pimple PA on 04/11/21 at 11:45 AM at [facility]. Patient understands discharge instructions? YES Patient understands medications and regiment? YES Patient understands to bring all medications to this visit? YES    Are you enrolled in My Chart YES.

## 2021-04-05 NOTE — Telephone Encounter (Signed)
Please switch to Humalog quik pen with same sig

## 2021-04-05 NOTE — Telephone Encounter (Signed)
New rx sent in along with pen needles.

## 2021-04-05 NOTE — Telephone Encounter (Signed)
Pharmacy Transitions of Care Follow-up Telephone Call  Date of discharge: 04/02/21 Discharge Diagnosis: NSTEMI  How have you been since you were released from the hospital? Patient reports feeling good since leaving the hospital. She did notice some shortness of breath with Brilinta but followed up with doctor.   Medication changes made at discharge:  - START: Low dose aspirin, Brilinta, carvedilol, and nitroglycerin  - CHANGED: Amlodipine 10mg  daily and atorvastatin 80mg  daily   Medication changes verified by the patient? Yes    Medication Accessibility:  Home Pharmacy: The patient previously used [facility] in [place], but wants to switch to [facility] outpatient pharmacy.   Was the patient provided with refills on discharged medications? Yes  Have all prescriptions been transferred from [facility] to home pharmacy? Yes  Is the patient able to afford medications? Yes . Notable copays: $25 . Eligible patient assistance: Yes, copay card available     Medication Review: TICAGRELOR (BRILINTA) Ticagrelor 90 mg BID initiated on 04/02/21.  - Educated patient on expected duration of therapy of aspirin with ticagrelor.  - Discussed importance of taking medication around the same time every day, - Reviewed potential DDIs with patient - Advised patient of medications to avoid (NSAIDs, aspirin maintenance doses>100 mg daily) - Educated that Tylenol (acetaminophen) will be the preferred analgesic to prevent risk of bleeding  - Emphasized importance of monitoring for signs and symptoms of bleeding (abnormal bruising, prolonged bleeding, nose bleeds, bleeding from gums, discolored urine, black tarry stools)  - Educated patient to notify doctor if shortness of breath or abnormal heartbeat occur - Advised patient to alert all providers of antiplatelet therapy prior to starting a new medication or having a procedure    Follow-up Appointments:  [facility] f[facility]u appt confirmed? Yes Scheduled to see  [facility] on  04/11/21 @ 11:45am.   If their condition worsens, is the pt aware to call PCP or go to the Emergency Dept.? Yes  Final Patient Assessment: Patient feels comfortable with medications but will follow-up with doctor with shortness of breath continues.

## 2021-04-08 ENCOUNTER — Telehealth (HOSPITAL_COMMUNITY): Payer: Self-pay

## 2021-04-08 ENCOUNTER — Telehealth: Payer: Self-pay

## 2021-04-08 NOTE — Telephone Encounter (Signed)
Called and spoke with pt in regards to CR, pt stated she is not able to participate in CR at this time due to her work schedule.  closed referral

## 2021-04-08 NOTE — Telephone Encounter (Signed)
Pt insurance is active and benefits verified through [place]. Co-pay $0.00, DED $4,000.00/$711.46 met, out of pocket $7,150.00/$774.65 met, co-insurance 20%. No pre-authorization required. Passport, 04/08/21 @ 3:38pm, [facility]#[id number]  Will contact patient to see if she is interested in the Cardiac Rehab Program. If interested, patient will need to complete follow up appt. Once completed, patient will be contacted for scheduling upon review by the RN Navigator.

## 2021-04-08 NOTE — Chronic Care Management (AMB) (Signed)
  Care Management   Note  04/08/2021 Name: Bonnie Dickson MRN: [id number] DOB: 06-26-1971  Bonnie Dickson is a 50 y.o. year old female who is a primary care patient of Bradd Canary, MD. I reached out to [name] by phone today in response to a referral sent by Bonnie Dickson's PCP Bradd Canary, MD.    Ms. Doetsch was given information about care management services today including:  1. Care management services include personalized support from designated clinical staff supervised by her physician, including individualized plan of care and coordination with other care providers 2. 24/7 contact phone numbers for assistance for urgent and routine care needs. 3. The patient may stop care management services at any time by phone call to the office staff.  Patient did not agree to enrollment in care management services and does not wish to consider at this time.  Follow up plan: Patient declines up and engagement by the care management team. Appropriate care team members and provider have been notified via electronic communication.   Penne Lash, RMA Care Guide, Embedded Care Coordination [facility]  [place], [place] [address] Direct Dial: [phone] [email or web address] Website: [email or web address]

## 2021-04-11 ENCOUNTER — Encounter: Payer: Self-pay | Admitting: Medical

## 2021-04-11 ENCOUNTER — Other Ambulatory Visit: Payer: Self-pay

## 2021-04-11 ENCOUNTER — Ambulatory Visit (INDEPENDENT_AMBULATORY_CARE_PROVIDER_SITE_OTHER): Payer: BC Managed Care – PPO | Admitting: Medical

## 2021-04-11 VITALS — BP 148/90 | HR 67 | Ht 67.0 in | Wt 206.4 lb

## 2021-04-11 DIAGNOSIS — E785 Hyperlipidemia, unspecified: Secondary | ICD-10-CM

## 2021-04-11 DIAGNOSIS — I2510 Atherosclerotic heart disease of native coronary artery without angina pectoris: Secondary | ICD-10-CM | POA: Diagnosis not present

## 2021-04-11 DIAGNOSIS — E119 Type 2 diabetes mellitus without complications: Secondary | ICD-10-CM

## 2021-04-11 DIAGNOSIS — I10 Essential (primary) hypertension: Secondary | ICD-10-CM | POA: Diagnosis not present

## 2021-04-11 DIAGNOSIS — I255 Ischemic cardiomyopathy: Secondary | ICD-10-CM | POA: Diagnosis not present

## 2021-04-11 DIAGNOSIS — I5042 Chronic combined systolic (congestive) and diastolic (congestive) heart failure: Secondary | ICD-10-CM

## 2021-04-11 DIAGNOSIS — Z720 Tobacco use: Secondary | ICD-10-CM

## 2021-04-11 NOTE — Patient Instructions (Signed)
Medication Instructions:  Your physician recommends that you continue on your current medications as directed. Please refer to the Current Medication list given to you today.  *If you need a refill on your cardiac medications before your next appointment, please call your pharmacy*  Lab Work:  Fasting Lipid Panel-DO NOT EAT OR DRINK PAST MIDNIGHT. OKAY TO HAVE WATER TO DRINK.   Hepatic (Liver) Function Test  If you have labs (blood work) drawn today and your tests are completely normal, you will receive your results only by: Marland Kitchen MyChart Message (if you have MyChart) OR . A paper copy in the mail If you have any lab test that is abnormal or we need to change your treatment, we will call you to review the results.  Testing/Procedures: NONE ordered at this time of appointment   Follow-Up: At [facility], you and your health needs are our priority.  As part of our continuing mission to provide you with exceptional heart care, we have created designated Provider Care Teams.  These Care Teams include your primary Cardiologist (physician) and Advanced Practice Providers (APPs -  Physician Assistants and Nurse Practitioners) who all work together to provide you with the care you need, when you need it.  Your next appointment:   3 month(s)  The format for your next appointment:   In Person  Provider:   K. Italy Hilty, MD  Other Instructions

## 2021-04-11 NOTE — Progress Notes (Signed)
Cardiology Office Note   Date:  04/17/2021   ID:  Camile, Esters Jun 01, 1971, MRN [id number]  PCP:  Bradd Canary, MD  Cardiologist:  Chrystie Nose, MD EP: None  Chief Complaint  Patient presents with  . Hospitalization Follow-up    Recent NSTEMI      History of Present Illness: Bonnie Dickson is a 50 y.o. female with a PMH of HTN, HLD, DM type 2, IBS, and tobacco abuse, who presents for post-hospital follow-up.  Was admitted to the hospital from 03/31/21-04/02/21 for an NSTEMI. Prior to this she was admitted to the hospital 03/25/21-03/26/21 for chest pain where she was found to have minimally elevated HsTrop. Echo 03/26/21 showed EF 60-65%, G2DD, no RWMA, elevated LA pressures, mild LAE, and no significant valvular abnormalities. Her chest pain was felt to be atypical at that time and likely 2/2 hypertensive urgency. Unfortunately she had recurrent chest pain prompting her to present to the ED again. This time her HsTrop peaked at 1057. She underwent a LHC 04/01/21 showing 2-vessel CAD with 95% ostial-proximal LAD stenosis managed with PCI/DES, as well as 50% pRCA stenosis and 60% ostial PDA stenosis which were meically managed. EF was felt to be mildly reduced with mid apical anterior hypokinesis on cath. She was started on aspirin and brilinta for anticipated 1 year course of DAPT. Metoprolol was transitioned to carvedilol for improved BP control, in addition to increasing amlodipine.   She presents today for post-hospital follow-up of recent NSTEMI. She has been doing fairly well since discharge. Occasional chest discomfort often after eating a meal which she attributes to reflux. She denies chest pain reminiscent of her recent MI. She is adjusting to the brilinta and notes improvement in her SOB associated with this medication. No complaints of dizziness, lightheadedness, syncope, palpitations, or LE edema. She reports blood pressures are generally in the 120s/80s at home.      Past Medical History:  Diagnosis Date  . CAD (coronary artery disease)    a. NSTEMI 03/2021 s/p DES to LAD.  Marland Kitchen Depression    situational  . Diabetes mellitus without complication (HCC)   . Endometriosis   . History of chicken pox   . HSV-2 (herpes simplex virus 2) infection   . Hyperglycemia 07/24/2019  . Hyperlipidemia   . Hypertension   . Insomnia   . Ischemic cardiomyopathy    a. EF 45-50% by cath 03/2021.  Marland Kitchen Snoring   . Tonsillitis and adenoiditis, chronic    childhood, caused adult snoring    Past Surgical History:  Procedure Laterality Date  . ABDOMINAL HYSTERECTOMY     ovaries left in place  . BREAST CYST EXCISION Right    35 years ago   . COMBINED ABDOMINOPLASTY AND LIPOSUCTION    . CORONARY STENT INTERVENTION N/A 04/01/2021   Procedure: CORONARY STENT INTERVENTION;  Surgeon: Marykay Lex, MD;  Location: [facility] INVASIVE CV LAB;  Service: Cardiovascular;  Laterality: N/A;  . ENDOMETRIAL ABLATION    . ENDOMETRIAL BIOPSY    . LAPAROSCOPIC ENDOMETRIOSIS FULGURATION    . LEFT HEART CATH AND CORONARY ANGIOGRAPHY N/A 04/01/2021   Procedure: LEFT HEART CATH AND CORONARY ANGIOGRAPHY;  Surgeon: Marykay Lex, MD;  Location: [facility] INVASIVE CV LAB;  Service: Cardiovascular;  Laterality: N/A;  . PARTIAL HYSTERECTOMY     vaginal  . TUBAL LIGATION       Current Outpatient Medications  Medication Sig Dispense Refill  . amLODipine (NORVASC) 10 MG tablet Take 1 tablet (  10 mg total) by mouth daily. 30 tablet 6  . aspirin 81 MG EC tablet Take 1 tablet (81 mg total) by mouth daily. Swallow whole. 30 tablet 11  . atorvastatin (LIPITOR) 80 MG tablet Take 1 tablet (80 mg total) by mouth every evening. 30 tablet 6  . carvedilol (COREG) 6.25 MG tablet Take 1 tablet (6.25 mg total) by mouth 2 (two) times daily with a meal. 60 tablet 6  . insulin lispro (HUMALOG KWIKPEN) 100 UNIT/ML KwikPen Inject 0-10 Units into the skin See admin instructions. Inject 0-10 units into the skin up to four  times a day, PER SLIDING SCALE 15 mL 1  . Insulin Pen Needle (PEN NEEDLES) 32G X 4 MM MISC Use as directed with Humalog up to 4 times a day 100 each 1  . losartan (COZAAR) 50 MG tablet Take 50 mg by mouth in the morning.    . nitroGLYCERIN (NITROSTAT) 0.4 MG SL tablet Place 1 tablet (0.4 mg total) under the tongue every 5 (five) minutes as needed for chest pain (up to 3 doses). 25 tablet 3  . ticagrelor (BRILINTA) 90 MG TABS tablet Take 1 tablet (90 mg total) by mouth 2 (two) times daily. 60 tablet 11  . valACYclovir (VALTREX) 500 MG tablet Take 1 tablet (500 mg total) by mouth 2 (two) times daily. 180 tablet 1   No current facility-administered medications for this visit.    Allergies:   Ciprofloxacin hcl, Gentamicin, Penicillins, Ambien [zolpidem tartrate], Asa [aspirin], Banana, Ivp dye [iodinated diagnostic agents], Lisinopril, Metformin and related, Nsaids, Pineapple, Tape, Codeine, Flagyl [metronidazole], and Morphine and related    Social History:  The patient  reports that she has been smoking cigarettes. She has a 15.00 pack-year smoking history. She has never used smokeless tobacco. She reports that she does not drink alcohol and does not use drugs.   Family History:  The patient's family history includes ADD / ADHD in her son; Asthma in her brother; COPD in her maternal grandfather; Cataracts in her father; Diabetes in her daughter, father, maternal grandfather, maternal grandmother, and mother; Eczema in her brother; Heart failure in her maternal grandfather; Hyperlipidemia in her maternal grandfather and mother; Hypertension in her daughter, maternal grandfather, maternal grandmother, and mother; ODD in her son.    ROS:  Please see the history of present illness.   Otherwise, review of systems are positive for none.   All other systems are reviewed and negative.    PHYSICAL EXAM: VS:  BP (!) 148/90 (BP Location: Right Arm, Patient Position: Sitting)   Pulse 67   Ht 5\' 7"  (1.702  m)   Wt 206 lb 6.4 oz (93.6 kg)   SpO2 99%   BMI 32.33 kg/m  , BMI Body mass index is 32.33 kg/m. GEN: Well nourished, well developed, in no acute distress HEENT: sclera anicteric Neck: no JVD, carotid bruits, or masses Cardiac: RRR; no murmurs, rubs, or gallops, no edema  Respiratory:  clear to auscultation bilaterally, normal work of breathing GI: soft, nontender, nondistended, + BS MS: no deformity or atrophy Skin: warm and dry, no rash Neuro:  Strength and sensation are intact Psych: euthymic mood, full affect   EKG:  EKG is ordered today. The ekg ordered today demonstrates sinus rhythm with rate 67 bpm, TWI in anterolateral leads (seen on previous though slightly deeper), no STE/D; overall no significant change from previous.   Recent Labs: 11/20/2020: ALT 17 03/31/2021: Magnesium 1.7 04/02/2021: BUN 13; Creatinine, Ser 0.71; Hemoglobin 13.4;  Platelets 344; Potassium 3.8; Sodium 133; TSH 0.326    Lipid Panel    Component Value Date/Time   CHOL 130 04/01/2021 0649   TRIG 44 04/01/2021 0649   HDL 39 (L) 04/01/2021 0649   CHOLHDL 3.3 04/01/2021 0649   VLDL 9 04/01/2021 0649   LDLCALC 82 04/01/2021 0649      Wt Readings from Last 3 Encounters:  04/11/21 206 lb 6.4 oz (93.6 kg)  04/02/21 201 lb 11.5 oz (91.5 kg)  03/26/21 203 lb 4.8 oz (92.2 kg)      Other studies Reviewed: Additional studies/ records that were reviewed today include:   Echocardiogram 03/26/21  1. Left ventricular ejection fraction, by estimation, is 60 to 65%. The  left ventricle has normal function. The left ventricle has no regional  wall motion abnormalities. Left ventricular diastolic parameters are  consistent with Grade II diastolic  dysfunction (pseudonormalization). Elevated left atrial pressure.  2. Right ventricular systolic function is normal. The right ventricular  size is normal. There is normal pulmonary artery systolic pressure.  3. Left atrial size was mildly dilated.  4. The  mitral valve is normal in structure. Trivial mitral valve  regurgitation. No evidence of mitral stenosis.  5. The aortic valve is normal in structure. Aortic valve regurgitation is  not visualized. No aortic stenosis is present.  6. The inferior vena cava is normal in size with greater than 50%  respiratory variability, suggesting right atrial pressure of 3 mmHg.   LHC 04/01/21  Culprit Lesion: Ost LAD to Prox LAD lesion is 95% stenosed.  A drug-eluting stent was successfully placed using a SYNERGY XD [id number]. -> Postdilated in tapered fashion from 4.1 down to 3.0 mm  Post intervention, there is a 0% residual stenosis.  Dist LAD lesion is 30% stenosed.  ------------------------------  Prox Cx to Mid Cx lesion is 30% stenosed.  Prox RCA lesion is 50% stenosed.  RPDA lesion is 60% stenosed.  ---------------------------  There is mild left ventricular systolic dysfunction. The left ventricular ejection fraction is 45-50% by visual estimate -> mid to apical anterior hypokinesis  LV end diastolic pressure is mildly elevated.  SUMMARY  Two-vessel CAD: Culprit lesion is severe 95% ostial-proximal LAD-eccentric stenosis; also noted moderate 50% proximal RCA and 60% ostial PDA. ? Successful DES PCI of ostial proximal LAD using a synergy DES 2.75 mm x 16 mm postdilated and taper fashion from 4.1 to 3.0 mm.  Mildly reduced EF with mid apical anterior hypokinesis. ? Systemic Hypertension with mild to moderately elevated LVEDP.  RECOMMENDATION  Return to nursing unit for ongoing care and TR band removal.  Aggressive blood pressure management-wean off nitroglycerin.  High-dose statin  [facility] 1 consult  Anticipate discharge tomorrow, if medically stable.  Bryan Lemma, MD    ASSESSMENT AND PLAN:  1. CAD s/p PCI/DES to LAD 04/01/21: no chest pain reminiscent of her MI. Compliant with DAPT - Continue aspirin and brilinta - Continue statin - Continue BBlocker  2. Ischemic  cardiomyopathy/chronic combined CHF: EF mildly reduced to 45-50% on LHC 04/01/21, though normal on echo 03/26/21. No volume overload complaints and she appears euvolemic on exam - Continue carvedilol and losartan - Continue to limit salt intake and monitor weights regularly  3. 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 BP is generally in the 120s/80s at home. She will notify office if BP remains elevated. - Continue amlodipine, carvedilol, and  losartan  4. HLD: LDL 82 04/01/21. Atorvastatin increased to 80mg  daily 04/01/21.  - Continue atorvastatin - Will need repeat FLP/LFTs in 4-6 weeks for close monitoring - she plans to see her PCP 05/23/21 and asks to complete her blood work at that time.   5. DM type 2: poorly controlled with A1C 9.5 earlier this month. Prior intolerance to metformin. She did not want to start an SGLT2 inhibitor due to concerns for side effects given sensitivities to medications - Continue insulin per PCP  6. Tobacco abuse: down to 4 cigarettes per day from 1/2 ppd.  - Continue to encourage cessation     Current medicines are reviewed at length with the patient today.  The patient does not have concerns regarding medicines.  The following changes have been made:  As above  Labs/ tests ordered today include:   Orders Placed This Encounter  Procedures  . Lipid panel  . Hepatic function panel  . EKG 12-Lead     Disposition:   FU with Dr. Rennis Golden in 3 months  Signed, Beatriz Stallion, PA-C  04/17/2021 3:19 PM

## 2021-04-19 ENCOUNTER — Telehealth: Payer: Self-pay | Admitting: Cardiology

## 2021-04-19 NOTE — Telephone Encounter (Signed)
[facility] received forms from [facility], forms were put in providers box on 5/13.

## 2021-05-01 ENCOUNTER — Other Ambulatory Visit (HOSPITAL_COMMUNITY): Payer: Self-pay

## 2021-05-09 ENCOUNTER — Telehealth: Payer: Self-pay | Admitting: Physician Assistant

## 2021-05-09 NOTE — Telephone Encounter (Signed)
Forms received back from provider, attempted to contact patient, busy tone not able to leave message.

## 2021-05-23 ENCOUNTER — Ambulatory Visit (INDEPENDENT_AMBULATORY_CARE_PROVIDER_SITE_OTHER): Payer: BC Managed Care – PPO | Admitting: Family Medicine

## 2021-05-23 ENCOUNTER — Telehealth: Payer: Self-pay | Admitting: Family Medicine

## 2021-05-23 ENCOUNTER — Other Ambulatory Visit: Payer: Self-pay

## 2021-05-23 VITALS — BP 112/78 | HR 60 | Temp 98.3°F | Resp 16 | Wt 203.4 lb

## 2021-05-23 DIAGNOSIS — E785 Hyperlipidemia, unspecified: Secondary | ICD-10-CM

## 2021-05-23 DIAGNOSIS — R7989 Other specified abnormal findings of blood chemistry: Secondary | ICD-10-CM

## 2021-05-23 DIAGNOSIS — E119 Type 2 diabetes mellitus without complications: Secondary | ICD-10-CM

## 2021-05-23 DIAGNOSIS — I10 Essential (primary) hypertension: Secondary | ICD-10-CM | POA: Diagnosis not present

## 2021-05-23 DIAGNOSIS — I2510 Atherosclerotic heart disease of native coronary artery without angina pectoris: Secondary | ICD-10-CM

## 2021-05-23 LAB — CBC WITH DIFFERENTIAL/PLATELET
Basophils Absolute: 0.1 10*3/uL (ref 0.0–0.1)
Basophils Relative: 0.8 % (ref 0.0–3.0)
Eosinophils Absolute: 0.3 10*3/uL (ref 0.0–0.7)
Eosinophils Relative: 3 % (ref 0.0–5.0)
HCT: 41.6 % (ref 36.0–46.0)
Hemoglobin: 14 g/dL (ref 12.0–15.0)
Lymphocytes Relative: 31.5 % (ref 12.0–46.0)
Lymphs Abs: 2.6 10*3/uL (ref 0.7–4.0)
MCHC: 33.7 g/dL (ref 30.0–36.0)
MCV: 95.6 fl (ref 78.0–100.0)
Monocytes Absolute: 0.6 10*3/uL (ref 0.1–1.0)
Monocytes Relative: 7.6 % (ref 3.0–12.0)
Neutro Abs: 4.8 10*3/uL (ref 1.4–7.7)
Neutrophils Relative %: 57.1 % (ref 43.0–77.0)
Platelets: 355 10*3/uL (ref 150.0–400.0)
RBC: 4.35 Mil/uL (ref 3.87–5.11)
RDW: 13.7 % (ref 11.5–15.5)
WBC: 8.3 10*3/uL (ref 4.0–10.5)

## 2021-05-23 LAB — LIPID PANEL
Cholesterol: 121 mg/dL (ref 0–200)
HDL: 31.7 mg/dL — ABNORMAL LOW (ref >39.00)
LDL Cholesterol: 76 mg/dL (ref 0–99)
NonHDL: 88.88
Total CHOL/HDL Ratio: 4
Triglycerides: 62 mg/dL (ref 0.0–149.0)
VLDL: 12.4 mg/dL (ref 0.0–40.0)

## 2021-05-23 LAB — COMPREHENSIVE METABOLIC PANEL
ALT: 14 U/L (ref 0–35)
AST: 12 U/L (ref 0–37)
Albumin: 4.4 g/dL (ref 3.5–5.2)
Alkaline Phosphatase: 94 U/L (ref 39–117)
BUN: 13 mg/dL (ref 6–23)
CO2: 26 mEq/L (ref 19–32)
Calcium: 9.6 mg/dL (ref 8.4–10.5)
Chloride: 104 mEq/L (ref 96–112)
Creatinine, Ser: 0.62 mg/dL (ref 0.40–1.20)
GFR: 104.81 mL/min (ref >60.00)
Glucose, Bld: 129 mg/dL — ABNORMAL HIGH (ref 70–99)
Potassium: 4 mEq/L (ref 3.5–5.1)
Sodium: 140 mEq/L (ref 135–145)
Total Bilirubin: 0.7 mg/dL (ref 0.2–1.2)
Total Protein: 7.6 g/dL (ref 6.0–8.3)

## 2021-05-23 NOTE — Assessment & Plan Note (Addendum)
95% LAD now s/p stent in May 2022. She is feeling better but she notes having to take a a deep breath at times and she wonders if it is caused by the Brilinta she is now on. She is otherwise tolerating it

## 2021-05-23 NOTE — Assessment & Plan Note (Signed)
Well controlled, no changes to meds. Encouraged heart healthy diet such as the DASH diet and exercise as tolerated.  °

## 2021-05-23 NOTE — Patient Instructions (Addendum)
MIND/DASH Eating Plan DASH stands for Dietary Approaches to Stop Hypertension. The DASH eating plan is a healthy eating plan that has been shown to: Reduce high blood pressure (hypertension). Reduce your risk for type 2 diabetes, heart disease, and stroke. Help with weight loss. What are tips for following this plan? Reading food labels Check food labels for the amount of salt (sodium) per serving. Choose foods with less than 5 percent of the Daily Value of sodium. Generally, foods with less than 300 milligrams (mg) of sodium per serving fit into this eating plan. To find whole grains, look for the word "whole" as the first word in the ingredient list. Shopping Buy products labeled as "low-sodium" or "no salt added." Buy fresh foods. Avoid canned foods and pre-made or frozen meals. Cooking Avoid adding salt when cooking. Use salt-free seasonings or herbs instead of table salt or sea salt. Check with your health care provider or pharmacist before using salt substitutes. Do not fry foods. Cook foods using healthy methods such as baking, boiling, grilling, roasting, and broiling instead. Cook with heart-healthy oils, such as olive, canola, avocado, soybean, or sunflower oil. Meal planning  Eat a balanced diet that includes: 4 or more servings of fruits and 4 or more servings of vegetables each day. Try to fill one-half of your plate with fruits and vegetables. 6-8 servings of whole grains each day. Less than 6 oz (170 g) of lean meat, poultry, or fish each day. A 3-oz (85-g) serving of meat is about the same size as a deck of cards. One egg equals 1 oz (28 g). 2-3 servings of low-fat dairy each day. One serving is 1 cup (237 mL). 1 serving of nuts, seeds, or beans 5 times each week. 2-3 servings of heart-healthy fats. Healthy fats called omega-3 fatty acids are found in foods such as walnuts, flaxseeds, fortified milks, and eggs. These fats are also found in cold-water fish, such as sardines,  salmon, and mackerel. Limit how much you eat of: Canned or prepackaged foods. Food that is high in trans fat, such as some fried foods. Food that is high in saturated fat, such as fatty meat. Desserts and other sweets, sugary drinks, and other foods with added sugar. Full-fat dairy products. Do not salt foods before eating. Do not eat more than 4 egg yolks a week. Try to eat at least 2 vegetarian meals a week. Eat more home-cooked food and less restaurant, buffet, and fast food.  Lifestyle When eating at a restaurant, ask that your food be prepared with less salt or no salt, if possible. If you drink alcohol: Limit how much you use to: 0-1 drink a day for women who are not pregnant. 0-2 drinks a day for men. Be aware of how much alcohol is in your drink. In the [place] one drink equals one 12 oz bottle of beer (355 mL), one 5 oz glass of wine (148 mL), or one 1 oz glass of hard liquor (44 mL). General information Avoid eating more than 2,300 mg of salt a day. If you have hypertension, you may need to reduce your sodium intake to 1,500 mg a day. Work with your health care provider to maintain a healthy body weight or to lose weight. Ask what an ideal weight is for you. Get at least 30 minutes of exercise that causes your heart to beat faster (aerobic exercise) most days of the week. Activities may include walking, swimming, or biking. Work with your health care provider or dietitian  to adjust your eating plan to your individual calorie needs. What foods should I eat? Fruits All fresh, dried, or frozen fruit. Canned fruit in natural juice (without addedsugar). Vegetables Fresh or frozen vegetables (raw, steamed, roasted, or grilled). Low-sodium or reduced-sodium tomato and vegetable juice. Low-sodium or reduced-sodium tomatosauce and tomato paste. Low-sodium or reduced-sodium canned vegetables. Grains Whole-grain or whole-wheat bread. Whole-grain or whole-wheat pasta. Brown rice. [place]. Bulgur. Whole-grain and low-sodium cereals. Pita bread.Low-fat, low-sodium crackers. Whole-wheat flour tortillas. Meats and other proteins Skinless chicken or [place]. Ground chicken or [place]. Pork with fat trimmed off. Fish and seafood. Egg whites. Dried beans, peas, or lentils. Unsalted nuts, nut butters, and seeds. Unsalted canned beans. Lean cuts of beef with fat trimmed off. Low-sodium, lean precooked or cured meat, such as sausages or meatloaves. Dairy Low-fat (1%) or fat-free (skim) milk. Reduced-fat, low-fat, or fat-free cheeses. Nonfat, low-sodium ricotta or cottage cheese. Low-fat or nonfatyogurt. Low-fat, low-sodium cheese. Fats and oils Soft margarine without trans fats. Vegetable oil. Reduced-fat, low-fat, or light mayonnaise and salad dressings (reduced-sodium). Canola, safflower, olive, avocado, soybean, andsunflower oils. Avocado. Seasonings and condiments Herbs. Spices. Seasoning mixes without salt. Other foods Unsalted popcorn and pretzels. Fat-free sweets. The items listed above may not be a complete list of foods and beverages you can eat. Contact a dietitian for more information. What foods should I avoid? Fruits Canned fruit in a light or heavy syrup. Fried fruit. Fruit in cream or buttersauce. Vegetables Creamed or fried vegetables. Vegetables in a cheese sauce. Regular canned vegetables (not low-sodium or reduced-sodium). Regular canned tomato sauce and paste (not low-sodium or reduced-sodium). Regular tomato and vegetable juice(not low-sodium or reduced-sodium). Angie Fava. Olives. Grains Baked goods made with fat, such as croissants, muffins, or some breads. Drypasta or rice meal packs. Meats and other proteins Fatty cuts of meat. Ribs. Fried meat. Berniece Salines. Bologna, salami, and other precooked or cured meats, such as sausages or meat loaves. Fat from the back of a pig (fatback). Bratwurst. Salted nuts and seeds. Canned beans with added salt. Canned orsmoked fish.  Whole eggs or egg yolks. Chicken or [place] with skin. Dairy Whole or 2% milk, cream, and half-and-half. Whole or full-fat cream cheese. Whole-fat or sweetened yogurt. Full-fat cheese. Nondairy creamers. Whippedtoppings. Processed cheese and cheese spreads. Fats and oils Butter. Stick margarine. Lard. Shortening. Ghee. Bacon fat. Tropical oils, suchas coconut, palm kernel, or palm oil. Seasonings and condiments Onion salt, garlic salt, seasoned salt, table salt, and sea salt. Worcestershire sauce. Tartar sauce. Barbecue sauce. Teriyaki sauce. Soy sauce, including reduced-sodium. Steak sauce. Canned and packaged gravies. Fish sauce. Oyster sauce. Cocktail sauce. Store-bought horseradish. Ketchup. Mustard. Meat flavorings and tenderizers. Bouillon cubes. Hot sauces. Pre-made or packaged marinades. Pre-made or packaged taco seasonings. Relishes. Regular saladdressings. Other foods Salted popcorn and pretzels. The items listed above may not be a complete list of foods and beverages you should avoid. Contact a dietitian for more information. Where to find more information National Heart, Lung, and Blood Institute: https://wilson-eaton.com/ American Heart Association: www.heart.org Academy of Nutrition and Dietetics: www.eatright.[place]: www.kidney.org Summary The DASH eating plan is a healthy eating plan that has been shown to reduce high blood pressure (hypertension). It may also reduce your risk for type 2 diabetes, heart disease, and stroke. When on the DASH eating plan, aim to eat more fresh fruits and vegetables, whole grains, lean proteins, low-fat dairy, and heart-healthy fats. With the DASH eating plan, you should limit salt (sodium) intake to 2,300 mg a  day. If you have hypertension, you may need to reduce your sodium intake to 1,500 mg a day. Work with your health care provider or dietitian to adjust your eating plan to your individual calorie needs. This information is not  intended to replace advice given to you by your health care provider. Make sure you discuss any questions you have with your healthcare provider. Document Revised: 10/21/2019 Document Reviewed: 10/21/2019 Elsevier Patient Education  2022 [employer].

## 2021-05-23 NOTE — Assessment & Plan Note (Signed)
Tolerating statin, encouraged heart healthy diet, avoid trans fats, minimize simple carbs and saturated fats. Increase exercise as tolerated 

## 2021-05-23 NOTE — Assessment & Plan Note (Addendum)
hgba1c acceptable, minimize simple carbs. Increase exercise as tolerated. Continue current meds but drop sliding scale down to 2 units at 150 to 200 and increase by 2 units every 50 points. Referred to Endocrinology for further consideration

## 2021-05-23 NOTE — Telephone Encounter (Signed)
Pt, would like a referral to the Endocrinology   Please advice

## 2021-05-23 NOTE — Telephone Encounter (Signed)
Please advise 

## 2021-05-26 NOTE — Progress Notes (Signed)
Subjective:    Patient ID: Bonnie Dickson, female    DOB: 05/01/1971, 50 y.o.   MRN: [id number]  Chief Complaint  Patient presents with   Follow-up   Diabetes    Shortness when sitting since starting brilinta.    HPI Patient is in today for follow up on CAD, Diabetes and more. In early May she had chest pain and presented to the hospital. She ws found to be having  NSTEMI and cardiac cath showed a 95% lesion in LAD. A stent was placed and she feels better although she does endorse feeling the need to breath deeply at times and she blames in on the Brilinta. She is trying to eat better and increase her physical activity. Denies palp/HA/congestion/fevers/GI or GU c/o. Taking meds as prescribed   Past Medical History:  Diagnosis Date   CAD (coronary artery disease)    a. NSTEMI 03/2021 s/p DES to LAD.   Depression    situational   Diabetes mellitus without complication (HCC)    Endometriosis    History of chicken pox    HSV-2 (herpes simplex virus 2) infection    Hyperglycemia 07/24/2019   Hyperlipidemia    Hypertension    Insomnia    Ischemic cardiomyopathy    a. EF 45-50% by cath 03/2021.   Snoring    Tonsillitis and adenoiditis, chronic    childhood, caused adult snoring    Past Surgical History:  Procedure Laterality Date   ABDOMINAL HYSTERECTOMY     ovaries left in place   BREAST CYST EXCISION Right    35 years ago    COMBINED ABDOMINOPLASTY AND LIPOSUCTION     CORONARY STENT INTERVENTION N/A 04/01/2021   Procedure: CORONARY STENT INTERVENTION;  Surgeon: Marykay Lex, MD;  Location: [facility] INVASIVE CV LAB;  Service: Cardiovascular;  Laterality: N/A;   ENDOMETRIAL ABLATION     ENDOMETRIAL BIOPSY     LAPAROSCOPIC ENDOMETRIOSIS FULGURATION     LEFT HEART CATH AND CORONARY ANGIOGRAPHY N/A 04/01/2021   Procedure: LEFT HEART CATH AND CORONARY ANGIOGRAPHY;  Surgeon: Marykay Lex, MD;  Location: [facility] INVASIVE CV LAB;  Service: Cardiovascular;  Laterality: N/A;   PARTIAL  HYSTERECTOMY     vaginal   TUBAL LIGATION      Family History  Problem Relation Age of Onset   Hypertension Mother    Diabetes Mother        prediabetes   Hyperlipidemia Mother    Diabetes Father    Cataracts Father    Eczema Brother    Asthma Brother    Diabetes Maternal Grandmother    Hypertension Maternal Grandmother    Diabetes Maternal Grandfather    Hypertension Maternal Grandfather    Hyperlipidemia Maternal Grandfather    Heart failure Maternal Grandfather    COPD Maternal Grandfather    ODD Son    ADD / ADHD Son    Diabetes Daughter    Hypertension Daughter    Colon cancer Neg Hx    Stomach cancer Neg Hx    Pancreatic cancer Neg Hx    Esophageal cancer Neg Hx     Social History   Socioeconomic History   Marital status: Widowed    Spouse name: Not on file   Number of children: Not on file   Years of education: Not on file   Highest education level: Not on file  Occupational History   Not on file  Tobacco Use   Smoking status: Every Day    Packs/day: 0.50  Years: 30.00    Pack years: 15.00    Types: Cigarettes   Smokeless tobacco: Never  Vaping Use   Vaping Use: Never used  Substance and Sexual Activity   Alcohol use: Never   Drug use: Never   Sexual activity: Not Currently    Birth control/protection: Surgical  Other Topics Concern   Not on file  Social History Narrative   Not on file   Social Determinants of Health   Financial Resource Strain: Not on file  Food Insecurity: Not on file  Transportation Needs: Not on file  Physical Activity: Not on file  Stress: Not on file  Social Connections: Not on file  Intimate Partner Violence: Not on file    Outpatient Medications Prior to Visit  Medication Sig Dispense Refill   amLODipine (NORVASC) 10 MG tablet Take 1 tablet (10 mg total) by mouth daily. 30 tablet 6   aspirin 81 MG EC tablet Take 1 tablet (81 mg total) by mouth daily. Swallow whole. 30 tablet 11   atorvastatin (LIPITOR) 80 MG  tablet Take 1 tablet (80 mg total) by mouth every evening. 30 tablet 6   carvedilol (COREG) 6.25 MG tablet Take 1 tablet (6.25 mg total) by mouth 2 (two) times daily with a meal. 60 tablet 6   insulin lispro (HUMALOG KWIKPEN) 100 UNIT/ML KwikPen Inject 0-10 Units into the skin See admin instructions. Inject 0-10 units into the skin up to four times a day, PER SLIDING SCALE 15 mL 1   Insulin Pen Needle (PEN NEEDLES) 32G X 4 MM MISC Use as directed with Humalog up to 4 times a day 100 each 1   losartan (COZAAR) 50 MG tablet Take 50 mg by mouth in the morning.     nitroGLYCERIN (NITROSTAT) 0.4 MG SL tablet Place 1 tablet (0.4 mg total) under the tongue every 5 (five) minutes as needed for chest pain (up to 3 doses). 25 tablet 3   ticagrelor (BRILINTA) 90 MG TABS tablet Take 1 tablet (90 mg total) by mouth 2 (two) times daily. 60 tablet 11   valACYclovir (VALTREX) 500 MG tablet Take 1 tablet (500 mg total) by mouth 2 (two) times daily. 180 tablet 1   No facility-administered medications prior to visit.    Allergies  Allergen Reactions   Ciprofloxacin Hcl Anaphylaxis   Gentamicin Anaphylaxis   Penicillins Anaphylaxis   Ambien [Zolpidem Tartrate] Other (See Comments)    Sleep walking    Asa [Aspirin] Other (See Comments)    Rectal bleeding    Banana Itching, Swelling and Other (See Comments)    Tongue itches and swells- breathing not impaired   Ivp Dye [Iodinated Diagnostic Agents] Nausea And Vomiting and Other (See Comments)    Severe vomiting    Lisinopril Cough   Metformin And Related Nausea Only and Other (See Comments)    Severe GI upset   Nsaids Other (See Comments)    Rectal bleeding    Pineapple Other (See Comments)    Causes a burning sensation in the mouth   Tape Other (See Comments)    Paper tape "burns the skin"   Codeine Rash   Flagyl [Metronidazole] Rash   Morphine And Related Rash    Review of Systems  Constitutional:  Negative for fever and malaise/fatigue.   HENT:  Negative for congestion.   Eyes:  Negative for blurred vision.  Respiratory:  Positive for shortness of breath.   Cardiovascular:  Negative for chest pain, palpitations and leg swelling.  Gastrointestinal:  Negative for abdominal pain, blood in stool and nausea.  Genitourinary:  Negative for dysuria and frequency.  Musculoskeletal:  Negative for falls.  Skin:  Negative for rash.  Neurological:  Negative for dizziness, loss of consciousness and headaches.  Endo/Heme/Allergies:  Negative for environmental allergies.  Psychiatric/Behavioral:  Negative for depression. The patient is not nervous/anxious.       Objective:    Physical Exam Constitutional:      General: She is not in acute distress.    Appearance: She is well-developed.  HENT:     Head: Normocephalic and atraumatic.  Eyes:     Conjunctiva/sclera: Conjunctivae normal.  Neck:     Thyroid: No thyromegaly.  Cardiovascular:     Rate and Rhythm: Normal rate and regular rhythm.     Heart sounds: Normal heart sounds. No murmur heard. Pulmonary:     Effort: Pulmonary effort is normal. No respiratory distress.     Breath sounds: Normal breath sounds.  Abdominal:     General: Bowel sounds are normal. There is no distension.     Palpations: Abdomen is soft. There is no mass.     Tenderness: There is no abdominal tenderness.  Musculoskeletal:     Cervical back: Neck supple.  Lymphadenopathy:     Cervical: No cervical adenopathy.  Skin:    General: Skin is warm and dry.  Neurological:     Mental Status: She is alert and oriented to person, place, and time.  Psychiatric:        Behavior: Behavior normal.    BP 112/78   Pulse 60   Temp 98.3 F (36.8 C)   Resp 16   Wt 203 lb 6.4 oz (92.3 kg)   SpO2 96%   BMI 31.86 kg/m  Wt Readings from Last 3 Encounters:  05/23/21 203 lb 6.4 oz (92.3 kg)  04/11/21 206 lb 6.4 oz (93.6 kg)  04/02/21 201 lb 11.5 oz (91.5 kg)    Diabetic Foot Exam - Simple   No data  filed    Lab Results  Component Value Date   WBC 8.3 05/23/2021   HGB 14.0 05/23/2021   HCT 41.6 05/23/2021   PLT 355.0 05/23/2021   GLUCOSE 129 (H) 05/23/2021   CHOL 121 05/23/2021   TRIG 62.0 05/23/2021   HDL 31.70 (L) 05/23/2021   LDLCALC 76 05/23/2021   ALT 14 05/23/2021   AST 12 05/23/2021   NA 140 05/23/2021   K 4.0 05/23/2021   CL 104 05/23/2021   CREATININE 0.62 05/23/2021   BUN 13 05/23/2021   CO2 26 05/23/2021   TSH 0.326 (L) 04/02/2021   INR 0.9 03/31/2021   HGBA1C 9.5 (H) 03/31/2021    Lab Results  Component Value Date   TSH 0.326 (L) 04/02/2021   Lab Results  Component Value Date   WBC 8.3 05/23/2021   HGB 14.0 05/23/2021   HCT 41.6 05/23/2021   MCV 95.6 05/23/2021   PLT 355.0 05/23/2021   Lab Results  Component Value Date   NA 140 05/23/2021   K 4.0 05/23/2021   CO2 26 05/23/2021   GLUCOSE 129 (H) 05/23/2021   BUN 13 05/23/2021   CREATININE 0.62 05/23/2021   BILITOT 0.7 05/23/2021   ALKPHOS 94 05/23/2021   AST 12 05/23/2021   ALT 14 05/23/2021   PROT 7.6 05/23/2021   ALBUMIN 4.4 05/23/2021   CALCIUM 9.6 05/23/2021   ANIONGAP 10 04/02/2021   GFR 104.81 05/23/2021   Lab Results  Component  Value Date   CHOL 121 05/23/2021   Lab Results  Component Value Date   HDL 31.70 (L) 05/23/2021   Lab Results  Component Value Date   LDLCALC 76 05/23/2021   Lab Results  Component Value Date   TRIG 62.0 05/23/2021   Lab Results  Component Value Date   CHOLHDL 4 05/23/2021   Lab Results  Component Value Date   HGBA1C 9.5 (H) 03/31/2021       Assessment & Plan:   Problem List Items Addressed This Visit     Diabetes mellitus without complication (HCC)    hgba1c acceptable, minimize simple carbs. Increase exercise as tolerated. Continue current meds but drop sliding scale down to 2 units at 150 to 200 and increase by 2 units every 50 points. Referred to Endocrinology for further consideration       Relevant Orders   Ambulatory  referral to Endocrinology   Hyperlipidemia LDL goal <70    Tolerating statin, encouraged heart healthy diet, avoid trans fats, minimize simple carbs and saturated fats. Increase exercise as tolerated       Relevant Orders   Lipid panel (Completed)   Hypertension    Well controlled, no changes to meds. Encouraged heart healthy diet such as the DASH diet and exercise as tolerated.        Relevant Orders   CBC with Differential/Platelet (Completed)   Comprehensive metabolic panel (Completed)   CAD (coronary artery disease)    95% LAD now s/p stent in May 2022. She is feeling better but she notes having to take a a deep breath at times and she wonders if it is caused by the Brilinta she is now on. She is otherwise tolerating it       Abnormal TSH - Primary    I am having Bonnie Dickson maintain her valACYclovir, amLODipine, aspirin, atorvastatin, carvedilol, ticagrelor, nitroGLYCERIN, insulin lispro, Pen Needles, and losartan.  No orders of the defined types were placed in this encounter.    Danise Edge, MD

## 2021-05-30 ENCOUNTER — Other Ambulatory Visit (HOSPITAL_COMMUNITY): Payer: Self-pay

## 2021-06-24 NOTE — Telephone Encounter (Signed)
Contacted patient to follow up regarding FMLA for [employer]. Patient states she still needs the forms, and will find the contact information and call our office back to give that information.

## 2021-06-28 ENCOUNTER — Other Ambulatory Visit (HOSPITAL_COMMUNITY): Payer: Self-pay

## 2021-07-01 ENCOUNTER — Other Ambulatory Visit (HOSPITAL_COMMUNITY): Payer: Self-pay

## 2021-07-04 IMAGING — DX DG CHEST 1V PORT
1 series · 1 of 1 positions shown · non-contrast
Comparison: 03/31/2021

CLINICAL DATA: Chest pain for 8 days, nausea and vomiting, tobacco
abuse

EXAM:
PORTABLE CHEST 1 VIEW

[chest]
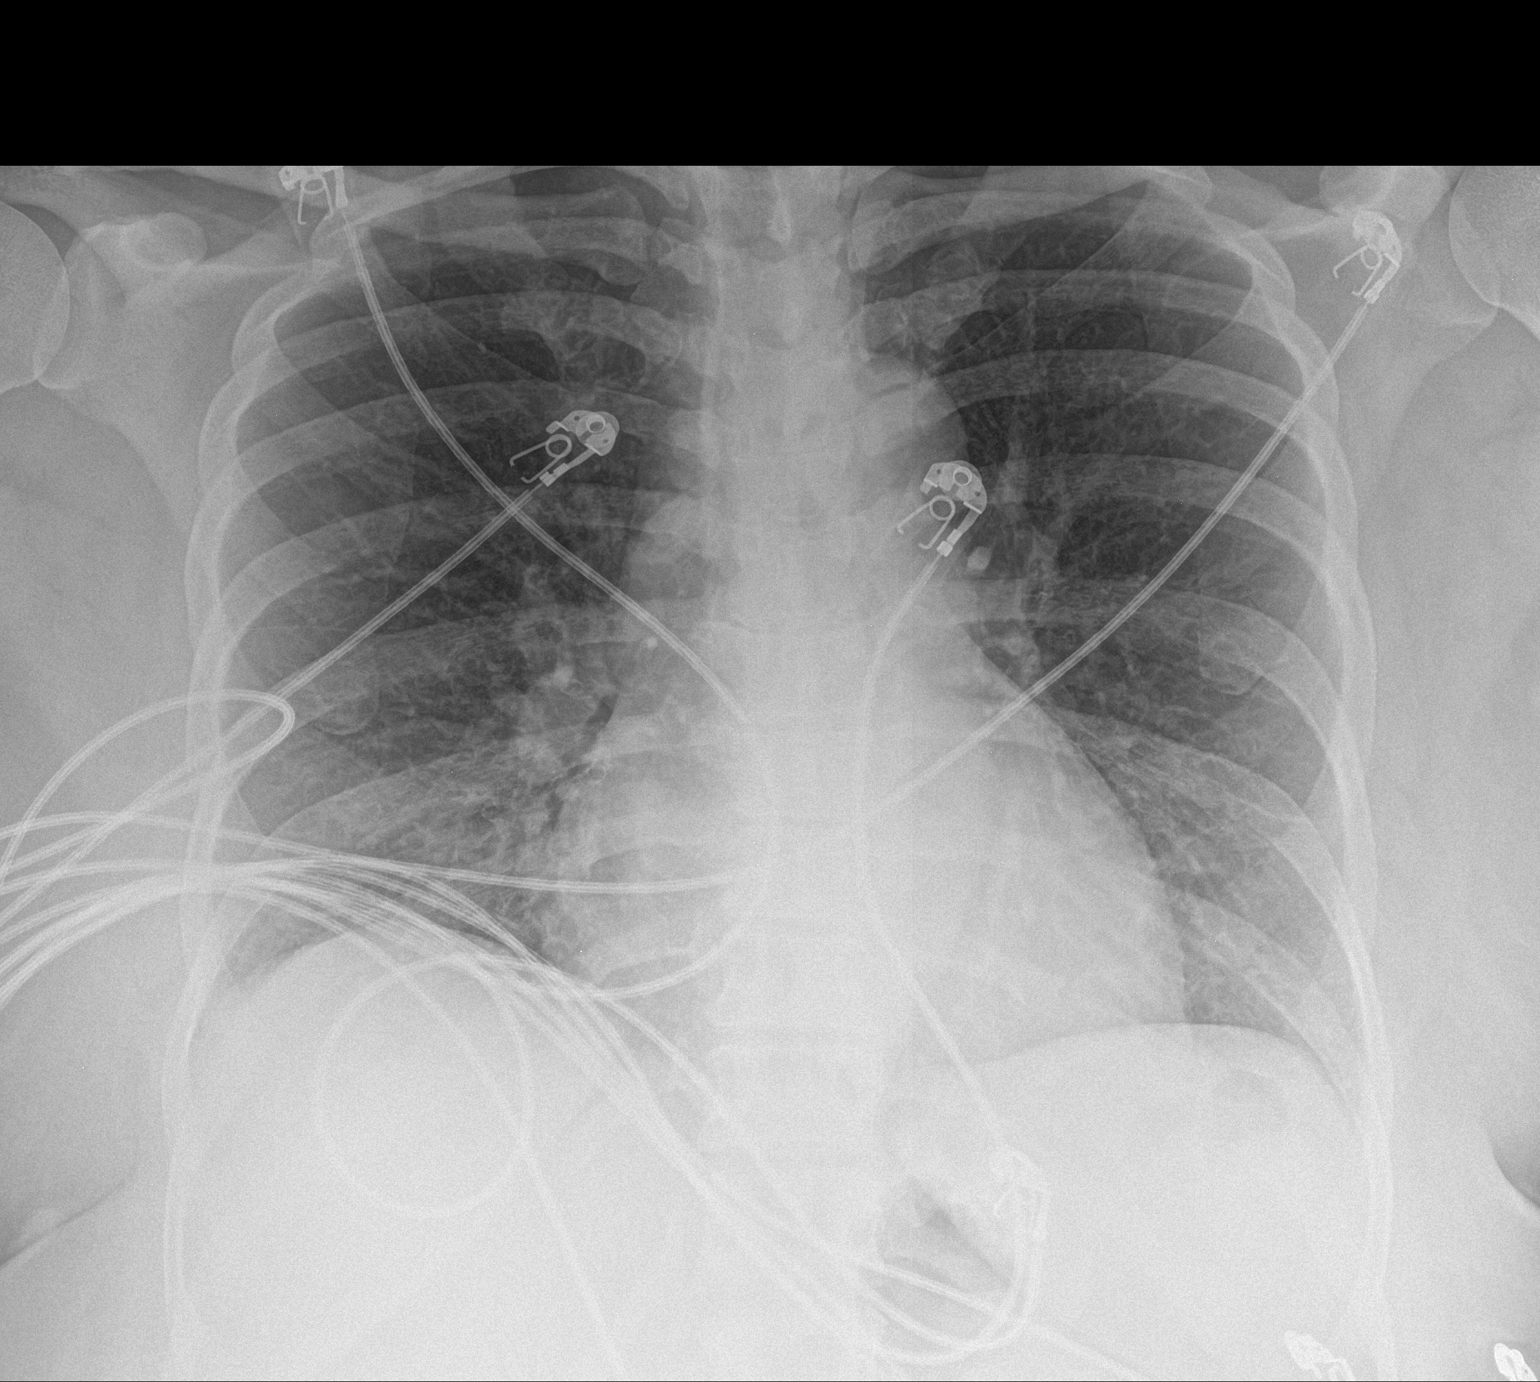

[1 of 1 positions shown; findings below may reference images not displayed]

FINDINGS: Single frontal view of the chest demonstrates an unremarkable
cardiac silhouette. Chronic interstitial prominence consistent with
scarring and tobacco abuse. No airspace disease, effusion, or
pneumothorax. No acute bony abnormalities.
IMPRESSION: 1. No acute intrathoracic process.

## 2021-07-11 ENCOUNTER — Other Ambulatory Visit: Payer: Self-pay

## 2021-07-11 ENCOUNTER — Ambulatory Visit (INDEPENDENT_AMBULATORY_CARE_PROVIDER_SITE_OTHER): Payer: BC Managed Care – PPO | Admitting: Medical

## 2021-07-11 ENCOUNTER — Other Ambulatory Visit (HOSPITAL_COMMUNITY): Payer: Self-pay

## 2021-07-11 ENCOUNTER — Encounter: Payer: Self-pay | Admitting: Medical

## 2021-07-11 VITALS — BP 128/82 | HR 70 | Ht 67.0 in | Wt 202.7 lb

## 2021-07-11 DIAGNOSIS — I255 Ischemic cardiomyopathy: Secondary | ICD-10-CM

## 2021-07-11 DIAGNOSIS — I10 Essential (primary) hypertension: Secondary | ICD-10-CM

## 2021-07-11 DIAGNOSIS — I5042 Chronic combined systolic (congestive) and diastolic (congestive) heart failure: Secondary | ICD-10-CM

## 2021-07-11 DIAGNOSIS — E119 Type 2 diabetes mellitus without complications: Secondary | ICD-10-CM

## 2021-07-11 DIAGNOSIS — E785 Hyperlipidemia, unspecified: Secondary | ICD-10-CM

## 2021-07-11 DIAGNOSIS — Z720 Tobacco use: Secondary | ICD-10-CM

## 2021-07-11 DIAGNOSIS — I2510 Atherosclerotic heart disease of native coronary artery without angina pectoris: Secondary | ICD-10-CM | POA: Diagnosis not present

## 2021-07-11 MED ORDER — CLOPIDOGREL BISULFATE 75 MG PO TABS
75.0000 mg | ORAL_TABLET | Freq: Every day | ORAL | 3 refills | Status: DC
  Filled 2021-07-11: qty 90, 90d supply, fill #0
  Filled 2021-10-04: qty 90, 90d supply, fill #1
  Filled 2021-12-31: qty 90, 90d supply, fill #2
  Filled 2022-03-31: qty 90, 90d supply, fill #3

## 2021-07-11 MED ORDER — ATORVASTATIN CALCIUM 40 MG PO TABS
40.0000 mg | ORAL_TABLET | Freq: Every day | ORAL | 3 refills | Status: DC
  Filled 2021-07-11: qty 90, 90d supply, fill #0

## 2021-07-11 NOTE — Progress Notes (Signed)
Cardiology Office Note   Date:  07/15/2021   ID:  Bonnie, Dickson [birth year], MRN [id number]  PCP:  Bradd Canary, MD  Cardiologist:  Chrystie Nose, MD EP: None  Chief Complaint  Patient presents with   Follow-up    CAD      History of Present Illness: Bonnie Dickson is a 50 y.o. female with a PMH of HTN, HLD, DM type 2, IBS, and tobacco abuse, who presents for 3 month follow-up.   Was admitted to the hospital from 03/31/21-04/02/21 for an NSTEMI. Prior to this she was admitted to the hospital 03/25/21-03/26/21 for chest pain where she was found to have minimally elevated HsTrop. Echo 03/26/21 showed EF 60-65%, G2DD, no RWMA, elevated LA pressures, mild LAE, and no significant valvular abnormalities. Her chest pain was felt to be atypical at that time and likely 2/2 hypertensive urgency. Unfortunately she had recurrent chest pain prompting her to present to the ED again. This time her HsTrop peaked at 1057. She underwent a LHC 04/01/21 showing 2-vessel CAD with 95% ostial-proximal LAD stenosis managed with PCI/DES, as well as 50% pRCA stenosis and 60% ostial PDA stenosis which were medically managed. EF was felt to be mildly reduced with mid apical anterior hypokinesis on cath. She was started on aspirin and brilinta for anticipated 1 year course of DAPT. Metoprolol was transitioned to carvedilol for improved BP control, in addition to increasing amlodipine. At her follow-up visit 04/11/21 she was doing well from a cardiac standpoint without anginal complaints. No medication changes occurred and she was recommended to follow-up in 3 months.   She presents today for routine follow-up. She reports worsening myalgias over the past couple months since increasing her atorvastatin. She has not had any exertional chest pain but has had some chest discomfort over the past week which she attributes to GI upset as symptoms are relieved with antacids. She reports she has been under increased stress  at work recently due to [employer] and she is interviewing for a higher-level position tomorrow. She has also continued to have some intermittent conversational SOB which she attributes to her brilinta. She denies DOE and has been exercising without trouble. She had brief LE edema for several days last week which she attributed to wearing high heels. No complaints of palpitations, dizziness, lightheadedness, or syncope.      Past Medical History:  Diagnosis Date   CAD (coronary artery disease)    a. NSTEMI 03/2021 s/p DES to LAD.   Depression    situational   Diabetes mellitus without complication (HCC)    Endometriosis    History of chicken pox    HSV-2 (herpes simplex virus 2) infection    Hyperglycemia 07/24/2019   Hyperlipidemia    Hypertension    Insomnia    Ischemic cardiomyopathy    a. EF 45-50% by cath 03/2021.   Snoring    Tonsillitis and adenoiditis, chronic    childhood, caused adult snoring    Past Surgical History:  Procedure Laterality Date   ABDOMINAL HYSTERECTOMY     ovaries left in place   BREAST CYST EXCISION Right    35 years ago    COMBINED ABDOMINOPLASTY AND LIPOSUCTION     CORONARY STENT INTERVENTION N/A 04/01/2021   Procedure: CORONARY STENT INTERVENTION;  Surgeon: Marykay Lex, MD;  Location: [facility] INVASIVE CV LAB;  Service: Cardiovascular;  Laterality: N/A;   ENDOMETRIAL ABLATION     ENDOMETRIAL BIOPSY     LAPAROSCOPIC ENDOMETRIOSIS FULGURATION  LEFT HEART CATH AND CORONARY ANGIOGRAPHY N/A 04/01/2021   Procedure: LEFT HEART CATH AND CORONARY ANGIOGRAPHY;  Surgeon: Marykay Lex, MD;  Location: [facility] INVASIVE CV LAB;  Service: Cardiovascular;  Laterality: N/A;   PARTIAL HYSTERECTOMY     vaginal   TUBAL LIGATION       Current Outpatient Medications  Medication Sig Dispense Refill   amLODipine (NORVASC) 10 MG tablet Take 1 tablet (10 mg total) by mouth daily. 30 tablet 6   aspirin 81 MG EC tablet Take 1 tablet (81 mg total) by mouth daily.  Swallow whole. 30 tablet 11   atorvastatin (LIPITOR) 40 MG tablet Take 1 tablet (40 mg total) by mouth daily. 90 tablet 3   carvedilol (COREG) 6.25 MG tablet Take 1 tablet (6.25 mg total) by mouth 2 (two) times daily with a meal. 60 tablet 6   clopidogrel (PLAVIX) 75 MG tablet On 8/13 take 4 tablets then starting 8/14, can take 1 tablet by mouth daily 90 tablet 3   insulin lispro (HUMALOG KWIKPEN) 100 UNIT/ML KwikPen Inject 0-10 Units into the skin See admin instructions. Inject 0-10 units into the skin up to four times a day, PER SLIDING SCALE 15 mL 1   Insulin Pen Needle (PEN NEEDLES) 32G X 4 MM MISC Use as directed with Humalog up to 4 times a day 100 each 1   losartan (COZAAR) 50 MG tablet Take 50 mg by mouth in the morning.     nitroGLYCERIN (NITROSTAT) 0.4 MG SL tablet Place 1 tablet (0.4 mg total) under the tongue every 5 (five) minutes as needed for chest pain (up to 3 doses). 25 tablet 3   valACYclovir (VALTREX) 500 MG tablet Take 1 tablet (500 mg total) by mouth 2 (two) times daily. 180 tablet 1   No current facility-administered medications for this visit.    Allergies:   Ciprofloxacin hcl, Gentamicin, Penicillins, Ambien [zolpidem tartrate], Asa [aspirin], Banana, Ivp dye [iodinated diagnostic agents], Lisinopril, Metformin and related, Nsaids, Pineapple, Tape, Codeine, Flagyl [metronidazole], and Morphine and related    Social History:  The patient  reports that she has been smoking cigarettes. She has a 15.00 pack-year smoking history. She has never used smokeless tobacco. She reports that she does not drink alcohol and does not use drugs.   Family History:  The patient's family history includes ADD / ADHD in her son; Asthma in her brother; COPD in her maternal grandfather; Cataracts in her father; Diabetes in her daughter, father, maternal grandfather, maternal grandmother, and mother; Eczema in her brother; Heart failure in her maternal grandfather; Hyperlipidemia in her maternal  grandfather and mother; Hypertension in her daughter, maternal grandfather, maternal grandmother, and mother; ODD in her son.    ROS:  Please see the history of present illness.   Otherwise, review of systems are positive for none.   All other systems are reviewed and negative.    PHYSICAL EXAM: VS:  BP 128/82 (BP Location: Right Arm, Patient Position: Sitting, Cuff Size: Large)   Pulse 70   Ht 5\' 7"  (1.702 m)   Wt 202 lb 11.2 oz (91.9 kg)   SpO2 98%   BMI 31.75 kg/m  , BMI Body mass index is 31.75 kg/m. GEN: Well nourished, well developed, in no acute distress HEENT: sclera anicteric Neck: no JVD, carotid bruits, or masses Cardiac: RRR; no murmurs, rubs, or gallops, no edema  Respiratory:  clear to auscultation bilaterally, normal work of breathing GI: soft, nontender, nondistended, + BS MS: no deformity  or atrophy Skin: warm and dry, no rash Neuro:  Strength and sensation are intact Psych: euthymic mood, full affect   EKG:  EKG is not ordered today.   Recent Labs: 03/31/2021: Magnesium 1.7 04/02/2021: TSH 0.326 05/23/2021: ALT 14; BUN 13; Creatinine, Ser 0.62; Hemoglobin 14.0; Platelets 355.0; Potassium 4.0; Sodium 140    Lipid Panel    Component Value Date/Time   CHOL 121 05/23/2021 1119   TRIG 62.0 05/23/2021 1119   HDL 31.70 (L) 05/23/2021 1119   CHOLHDL 4 05/23/2021 1119   VLDL 12.4 05/23/2021 1119   LDLCALC 76 05/23/2021 1119      Wt Readings from Last 3 Encounters:  07/11/21 202 lb 11.2 oz (91.9 kg)  05/23/21 203 lb 6.4 oz (92.3 kg)  04/11/21 206 lb 6.4 oz (93.6 kg)      Other studies Reviewed: Additional studies/ records that were reviewed today include:    Echocardiogram 03/26/21    1. Left ventricular ejection fraction, by estimation, is 60 to 65%. The  left ventricle has normal function. The left ventricle has no regional  wall motion abnormalities. Left ventricular diastolic parameters are  consistent with Grade II diastolic  dysfunction  (pseudonormalization). Elevated left atrial pressure.   2. Right ventricular systolic function is normal. The right ventricular  size is normal. There is normal pulmonary artery systolic pressure.   3. Left atrial size was mildly dilated.   4. The mitral valve is normal in structure. Trivial mitral valve  regurgitation. No evidence of mitral stenosis.   5. The aortic valve is normal in structure. Aortic valve regurgitation is  not visualized. No aortic stenosis is present.   6. The inferior vena cava is normal in size with greater than 50%  respiratory variability, suggesting right atrial pressure of 3 mmHg.     LHC 04/01/21  Culprit Lesion: Ost LAD to Prox LAD lesion is 95% stenosed. A drug-eluting stent was successfully placed using a SYNERGY XD [id number]. -> Postdilated in tapered fashion from 4.1 down to 3.0 mm Post intervention, there is a 0% residual stenosis. Dist LAD lesion is 30% stenosed. ------------------------------ Prox Cx to Mid Cx lesion is 30% stenosed. Prox RCA lesion is 50% stenosed. RPDA lesion is 60% stenosed. --------------------------- There is mild left ventricular systolic dysfunction. The left ventricular ejection fraction is 45-50% by visual estimate -> mid to apical anterior hypokinesis LV end diastolic pressure is mildly elevated.   SUMMARY Two-vessel CAD: Culprit lesion is severe 95% ostial-proximal LAD-eccentric stenosis; also noted moderate 50% proximal RCA and 60% ostial PDA. Successful DES PCI of ostial proximal LAD using a synergy DES 2.75 mm x 16 mm postdilated and taper fashion from 4.1 to 3.0 mm. Mildly reduced EF with mid apical anterior hypokinesis. Systemic Hypertension with mild to moderately elevated LVEDP.   RECOMMENDATION Return to nursing unit for ongoing care and TR band removal. Aggressive blood pressure management-wean off nitroglycerin. High-dose statin [facility] 1 consult    ASSESSMENT AND PLAN:  1. CAD s/p PCI/DES to LAD 04/01/21: no  chest pain reminiscent of her MI. Compliant with DAPT  - Continue aspirin  - Will stop brilinta and start plavix (load with 300mg  first dose, then 75mg  daily) given ongoing episodes of SOB - Continue statin  - Continue BBlocker     2. Ischemic cardiomyopathy/chronic combined CHF: EF mildly reduced to 45-50% on LHC 04/01/21, though normal on echo 03/26/21. No volume overload complaints and she appears euvolemic on exam  - Continue carvedilol and losartan  -  Continue to limit salt intake and monitor weights regularly  - Could consider repeat echo at follow-up visit to evaluate for improvement in EF    3. HTN: BP 128/82 today   - Continue amlodipine, carvedilol, and losartan     4. HLD: LDL 76 05/23/21 with increase in atorvastatin to 80mg  daily 04/01/21. Unfortunately she has had worsening myalgias with higher dose of atorvastatin - Will decrease atorvastatin to 40mg  daily - Will place referral to the lipid clinic for LDL above goal despite high-intensity statin and intolerance of higher dose statin    5. DM type 2: poorly controlled with A1C 9.5 03/2021. Prior intolerance to metformin. She did not want to start an SGLT2 inhibitor due to concerns for side effects given sensitivities to medications  - Continue insulin per PCP     6. Tobacco abuse: Continues to smoke 4 cigarettes per day  - Continue to encourage cessation     Current medicines are reviewed at length with the patient today.  The patient does not have concerns regarding medicines.  The following changes have been made:  As above  Labs/ tests ordered today include:   Orders Placed This Encounter  Procedures   AMB Referral to [facility]     Disposition:   FU with Dr. in 6 months  Signed, 05/2021, PA-C  07/15/2021 7:00 AM

## 2021-07-11 NOTE — Patient Instructions (Signed)
Medication Instructions:  Stop Brillinta on 8/12 after PM Dose. Decrease Lipitor to 40 mg (1 Tablet Daily.) Start Plavix 75 mg ( On 8/13 Take 300 mg  (4) Tablets. Then 1 Tablet 75 mg Daily.) *If you need a refill on your cardiac medications before your next appointment, please call your pharmacy*   Lab Work: No Labs If you have labs (blood work) drawn today and your tests are completely normal, you will receive your results only by: MyChart Message (if you have MyChart) OR A paper copy in the mail If you have any lab test that is abnormal or we need to change your treatment, we will call you to review the results.   Testing/Procedures: No Testing    Follow-Up: At [facility], you and your health needs are our priority.  As part of our continuing mission to provide you with exceptional heart care, we have created designated Provider Care Teams.  These Care Teams include your primary Cardiologist (physician) and Advanced Practice Providers (APPs -  Physician Assistants and Nurse Practitioners) who all work together to provide you with the care you need, when you need it.   Your next appointment:   6 month(s)  The format for your next appointment:   In Person  Provider:   K. [place] Hilty, MD

## 2021-07-15 ENCOUNTER — Encounter: Payer: Self-pay | Admitting: Medical

## 2021-08-02 ENCOUNTER — Other Ambulatory Visit (HOSPITAL_COMMUNITY): Payer: Self-pay

## 2021-08-06 ENCOUNTER — Encounter: Payer: Self-pay | Admitting: Internal Medicine

## 2021-08-06 ENCOUNTER — Other Ambulatory Visit: Payer: Self-pay

## 2021-08-06 ENCOUNTER — Ambulatory Visit (INDEPENDENT_AMBULATORY_CARE_PROVIDER_SITE_OTHER): Payer: BC Managed Care – PPO | Admitting: Internal Medicine

## 2021-08-06 ENCOUNTER — Other Ambulatory Visit (HOSPITAL_COMMUNITY): Payer: Self-pay

## 2021-08-06 VITALS — BP 160/100 | HR 80 | Ht 67.0 in | Wt 196.8 lb

## 2021-08-06 DIAGNOSIS — E1165 Type 2 diabetes mellitus with hyperglycemia: Secondary | ICD-10-CM

## 2021-08-06 DIAGNOSIS — E119 Type 2 diabetes mellitus without complications: Secondary | ICD-10-CM

## 2021-08-06 DIAGNOSIS — E1159 Type 2 diabetes mellitus with other circulatory complications: Secondary | ICD-10-CM | POA: Diagnosis not present

## 2021-08-06 LAB — POCT GLYCOSYLATED HEMOGLOBIN (HGB A1C): Hemoglobin A1C: 9.3 % — AB (ref 4.0–5.6)

## 2021-08-06 LAB — GLUCOSE, POCT (MANUAL RESULT ENTRY): POC Glucose: 298 mg/dl — AB (ref 70–99)

## 2021-08-06 MED ORDER — INSULIN LISPRO (1 UNIT DIAL) 100 UNIT/ML (KWIKPEN)
PEN_INJECTOR | SUBCUTANEOUS | 6 refills | Status: DC
  Filled 2021-08-06: qty 15, 50d supply, fill #0

## 2021-08-06 MED ORDER — LANTUS SOLOSTAR 100 UNIT/ML ~~LOC~~ SOPN
22.0000 [IU] | PEN_INJECTOR | Freq: Every day | SUBCUTANEOUS | 6 refills | Status: DC
  Filled 2021-08-06: qty 15, 68d supply, fill #0
  Filled 2021-10-16: qty 15, 68d supply, fill #1

## 2021-08-06 MED ORDER — DEXCOM G6 SENSOR MISC
1.0000 | 3 refills | Status: DC
  Filled 2021-08-15: qty 9, 90d supply, fill #0
  Filled 2021-11-06: qty 9, 90d supply, fill #1
  Filled 2022-01-31: qty 9, 90d supply, fill #2

## 2021-08-06 MED ORDER — INSULIN PEN NEEDLE 32G X 4 MM MISC
1.0000 | Freq: Four times a day (QID) | 2 refills | Status: AC
  Filled 2021-08-06: qty 400, 90d supply, fill #0

## 2021-08-06 MED ORDER — DEXCOM G6 TRANSMITTER MISC
1.0000 | 3 refills | Status: DC
Start: 2021-08-06 — End: 2022-03-05
  Filled 2021-08-15: qty 1, 90d supply, fill #0
  Filled 2021-11-06: qty 1, 90d supply, fill #1

## 2021-08-06 NOTE — Progress Notes (Signed)
Name: Bonnie Dickson  MRN/ DOB: [id number], 01-01-1971   Age/ Sex: 50 y.o., female    PCP: Bradd Canary, MD   Reason for Endocrinology Evaluation: Type 2 Diabetes Mellitus     Date of Initial Endocrinology Visit: 08/06/2021     PATIENT IDENTIFIER: [name] is a 50 y.o. female with a past medical history of HTN, T2DM, dyslipidemia, CAD . The patient presented for initial endocrinology clinic visit on 08/06/2021 for consultative assistance with her diabetes management.    HPI: Bonnie Dickson was    Diagnosed with DM 2010 Prior Medications tried/Intolerance: Metformin- GI upset. Was on Insulin in 2010-2011 and actos. Stopped all meds 01/2010 and was diet controlled until 10/2020 that she attributes to prednisone due to allergic reaction to Flagyl.  Currently checking blood sugars occasionally  x / day Hypoglycemia episodes : no                Hemoglobin A1c has ranged from 6.5% in 2020, peaking at 11.0% in 2021. Patient required assistance for hypoglycemia: no  Patient has required hospitalization within the last 1 year from hyper or hypoglycemia: no  In terms of diet, the patient eats 2 meals, does not snack, avoids sugar sweetened beverages    HOME DIABETES REGIMEN: Humalog per SS     Statin: yes ACE-I/ARB: yes Prior Diabetic Education: no    METER DOWNLOAD SUMMARY: Did not bring       DIABETIC COMPLICATIONS: Microvascular complications:   Denies: CKD , neuropathy, retinopathy Last eye exam: pending  9/28th  Macrovascular complications:  CAD Denies:  PVD, CVA   PAST HISTORY: Past Medical History:  Past Medical History:  Diagnosis Date   CAD (coronary artery disease)    a. NSTEMI 03/2021 s/p DES to LAD.   Depression    situational   Diabetes mellitus without complication (HCC)    Endometriosis    History of chicken pox    HSV-2 (herpes simplex virus 2) infection    Hyperglycemia 07/24/2019   Hyperlipidemia    Hypertension    Insomnia     Ischemic cardiomyopathy    a. EF 45-50% by cath 03/2021.   Snoring    Tonsillitis and adenoiditis, chronic    childhood, caused adult snoring   Past Surgical History:  Past Surgical History:  Procedure Laterality Date   ABDOMINAL HYSTERECTOMY     ovaries left in place   BREAST CYST EXCISION Right    35 years ago    COMBINED ABDOMINOPLASTY AND LIPOSUCTION     CORONARY STENT INTERVENTION N/A 04/01/2021   Procedure: CORONARY STENT INTERVENTION;  Surgeon: Marykay Lex, MD;  Location: [facility] INVASIVE CV LAB;  Service: Cardiovascular;  Laterality: N/A;   ENDOMETRIAL ABLATION     ENDOMETRIAL BIOPSY     LAPAROSCOPIC ENDOMETRIOSIS FULGURATION     LEFT HEART CATH AND CORONARY ANGIOGRAPHY N/A 04/01/2021   Procedure: LEFT HEART CATH AND CORONARY ANGIOGRAPHY;  Surgeon: Marykay Lex, MD;  Location: [facility] INVASIVE CV LAB;  Service: Cardiovascular;  Laterality: N/A;   PARTIAL HYSTERECTOMY     vaginal   TUBAL LIGATION      Social History:  reports that she has been smoking cigarettes. She has a 15.00 pack-year smoking history. She has never used smokeless tobacco. She reports that she does not drink alcohol and does not use drugs. Family History:  Family History  Problem Relation Age of Onset   Hypertension Mother    Diabetes Mother  prediabetes   Hyperlipidemia Mother    Diabetes Father    Cataracts Father    Eczema Brother    Asthma Brother    Diabetes Maternal Grandmother    Hypertension Maternal Grandmother    Diabetes Maternal Grandfather    Hypertension Maternal Grandfather    Hyperlipidemia Maternal Grandfather    Heart failure Maternal Grandfather    COPD Maternal Grandfather    ODD Son    ADD / ADHD Son    Diabetes Daughter    Hypertension Daughter    Colon cancer Neg Hx    Stomach cancer Neg Hx    Pancreatic cancer Neg Hx    Esophageal cancer Neg Hx      HOME MEDICATIONS: Allergies as of 08/06/2021       Reactions   Ciprofloxacin Hcl Anaphylaxis   Gentamicin  Anaphylaxis   Penicillins Anaphylaxis   Ambien [zolpidem Tartrate] Other (See Comments)   Sleep walking   Asa [aspirin] Other (See Comments)   Rectal bleeding   Banana Itching, Swelling, Other (See Comments)   Tongue itches and swells- breathing not impaired   Ivp Dye [iodinated Diagnostic Agents] Nausea And Vomiting, Other (See Comments)   Severe vomiting   Lisinopril Cough   Metformin And Related Nausea Only, Other (See Comments)   Severe GI upset   Nsaids Other (See Comments)   Rectal bleeding   Pineapple Other (See Comments)   Causes a burning sensation in the mouth   Tape Other (See Comments)   Paper tape "burns the skin"   Codeine Rash   Flagyl [metronidazole] Rash   Morphine And Related Rash        Medication List        Accurate as of August 06, 2021  1:57 PM. If you have any questions, ask your nurse or doctor.          amLODipine 10 MG tablet Commonly known as: NORVASC Take 1 tablet (10 mg total) by mouth daily.   Aspirin Low Dose 81 MG EC tablet Generic drug: aspirin Take 1 tablet (81 mg total) by mouth daily. Swallow whole.   atorvastatin 40 MG tablet Commonly known as: LIPITOR Take 1 tablet (40 mg total) by mouth daily.   carvedilol 6.25 MG tablet Commonly known as: COREG Take 1 tablet (6.25 mg total) by mouth 2 (two) times daily with a meal.   clopidogrel 75 MG tablet Commonly known as: PLAVIX On 8/13 take 4 tablets then starting 8/14, can take 1 tablet by mouth daily   insulin lispro 100 UNIT/ML KwikPen Commonly known as: HumaLOG KwikPen Inject 0-10 Units into the skin See admin instructions. Inject 0-10 units into the skin up to four times a day, PER SLIDING SCALE   losartan 50 MG tablet Commonly known as: COZAAR Take 50 mg by mouth in the morning.   nitroGLYCERIN 0.4 MG SL tablet Commonly known as: Nitrostat Place 1 tablet (0.4 mg total) under the tongue every 5 (five) minutes as needed for chest pain (up to 3 doses).   Pen  Needles 32G X 4 MM Misc Use as directed with Humalog up to 4 times a day   valACYclovir 500 MG tablet Commonly known as: VALTREX Take 1 tablet (500 mg total) by mouth 2 (two) times daily.         ALLERGIES: Allergies  Allergen Reactions   Ciprofloxacin Hcl Anaphylaxis   Gentamicin Anaphylaxis   Penicillins Anaphylaxis   Ambien [Zolpidem Tartrate] Other (See Comments)    Sleep  walking    Asa [Aspirin] Other (See Comments)    Rectal bleeding    Banana Itching, Swelling and Other (See Comments)    Tongue itches and swells- breathing not impaired   Ivp Dye [Iodinated Diagnostic Agents] Nausea And Vomiting and Other (See Comments)    Severe vomiting    Lisinopril Cough   Metformin And Related Nausea Only and Other (See Comments)    Severe GI upset   Nsaids Other (See Comments)    Rectal bleeding    Pineapple Other (See Comments)    Causes a burning sensation in the mouth   Tape Other (See Comments)    Paper tape "burns the skin"   Codeine Rash   Flagyl [Metronidazole] Rash   Morphine And Related Rash     REVIEW OF SYSTEMS: A comprehensive ROS was conducted with the patient and is negative except as per HPI and below:  Review of Systems  Gastrointestinal:  Negative for diarrhea and nausea.     OBJECTIVE:   VITAL SIGNS: Pulse 80   Ht 5\' 7"  (1.702 m)   Wt 196 lb 12.8 oz (89.3 kg)   SpO2 97%   BMI 30.82 kg/m    PHYSICAL EXAM:  General: Pt appears well and is in NAD  Neck: General: Supple without adenopathy or carotid bruits. Thyroid: Thyroid size normal.  No goiter or nodules appreciated.  Lungs: Clear with good BS bilat with no rales, rhonchi, or wheezes  Heart: RRR with normal S1 and S2 and no gallops; no murmurs; no rub  Abdomen: Normoactive bowel sounds, soft, nontender, without masses or organomegaly palpable  Extremities:  Lower extremities - No pretibial edema. No lesions.  Skin: Normal texture and temperature to palpation. No rash noted.   Neuro:  MS is good with appropriate affect, pt is alert and Ox3    DM foot exam: 08/06/2021   The skin of the feet is intact without sores or ulcerations. The pedal pulses are 2+ on right and 2+ on left. The sensation is intact to a screening 5.07, 10 gram monofilament bilaterally     DATA REVIEWED:  Lab Results  Component Value Date   HGBA1C 9.5 (H) 03/31/2021   HGBA1C 11.0 (H) 11/20/2020   HGBA1C 6.5 07/28/2019   Lab Results  Component Value Date   LDLCALC 76 05/23/2021   CREATININE 0.62 05/23/2021   No results found for: [facility]  Lab Results  Component Value Date   CHOL 121 05/23/2021   HDL 31.70 (L) 05/23/2021   LDLCALC 76 05/23/2021   TRIG 62.0 05/23/2021   CHOLHDL 4 05/23/2021        ASSESSMENT / PLAN / RECOMMENDATIONS:   1) Type 2 Diabetes Mellitus, Poorly controlled, With macrovascular  complications - Most recent A1c of 9.3 %. Goal A1c < 7.0 %.      -I have discussed with the patient the pathophysiology of diabetes. We went over the natural progression of the disease. We talked about both insulin resistance and insulin deficiency. I explained the complications associated with diabetes including retinopathy, nephropathy, neuropathy as well as increased risk of cardiovascular disease. We went over the benefit seen with glycemic control.  -She has history of multiple drug interactions, intolerance, and and sensitivities, hence she is reluctant in trying SGLT2 inhibitors, as well as GLP-1 agonist, despite discussing cardiovascular benefits.  Her friend was on 05/25/2021 and developed necrotizing fasciitis  - She is intolerant to Metformin  -Patient would like to use insulin, she has used this  in the past and has been able to get off insulin with  weight loss and lifestyle changes.  MEDICATIONS: - Start Lantus 22 units daily  - Humalog correctional insulin: (BG-130/35) 3   EDUCATION / INSTRUCTIONS: BG monitoring instructions: Patient is instructed to check her blood  sugars 3 times a day, before meals. Call [facility] if: BG persistently < 70  I reviewed the Rule of 15 for the treatment of hypoglycemia in detail with the patient. Literature supplied.   2) Diabetic complications:  Eye: Does not have known diabetic retinopathy.  Neuro/ Feet: Does not have known diabetic peripheral neuropathy. Renal: Patient does not have known baseline CKD. She is on an ACEI/ARB at present.Check urine albumin/creatinine ratio yearly starting at time of diagnosis. If albuminuria is positive, treatment is geared toward better glucose, blood pressure control and use of ACE inhibitors or ARBs. Monitor electrolytes and creatinine once to twice yearly.   3) CAD/ Dyslipidemia : Patient is on a statin, she has a pending referral to lipidologist as she is having arthralgia to statins therapy  - Follows with cardiology     I spent 45 minutes preparing to see the patient by review of recent labs, imaging and procedures, obtaining and reviewing separately obtained history, communicating with the patient/family or caregiver, ordering medications, tests or procedures, and documenting clinical information in the EHR including the differential Dx, treatment, and any further evaluation and other management    Signed electronically by: Lyndle Herrlich, MD  [facility]  [facility] [address] [place], [place] [address] Phone: [phone] FAX: [phone]   CC: Bradd Canary, MD [address] [place] [address] Phone: [phone]  Fax: [phone]    Return to Endocrinology clinic as below: Future Appointments  Date Time Provider Department Center  08/06/2021  2:00 PM Trae Bovenzi, Konrad Dolores, MD [facility]  08/26/2021  3:40 PM Bradd Canary, MD [facility]  11/12/2021  8:15 AM Hilty, Lisette Abu, MD [facility]

## 2021-08-06 NOTE — Patient Instructions (Signed)
-   Start Lantus 22 units daily  -Humalog correctional insulin:  Use the scale below to help guide you before each meal   Blood sugar before meal Number of units to inject  Less than 160 0 unit  161 -  195 1 units  196 -  220 2 units  221 -  250 3 units  251-  280 4 units  281 -  310 5 units  311 -  340 6 units  341 -  370 7 units  371 -  400 8 units  401 - 430 9 units      HOW TO TREAT LOW BLOOD SUGARS (Blood sugar LESS THAN 70 MG/DL) Please follow the RULE OF 15 for the treatment of hypoglycemia treatment (when your (blood sugars are less than 70 mg/dL)   STEP 1: Take 15 grams of carbohydrates when your blood sugar is low, which includes:  3-4 GLUCOSE TABS  OR 3-4 OZ OF JUICE OR REGULAR SODA OR ONE TUBE OF GLUCOSE GEL    STEP 2: RECHECK blood sugar in 15 MINUTES STEP 3: If your blood sugar is still low at the 15 minute recheck --> then, go back to STEP 1 and treat AGAIN with another 15 grams of carbohydrates.

## 2021-08-07 DIAGNOSIS — E1165 Type 2 diabetes mellitus with hyperglycemia: Secondary | ICD-10-CM | POA: Insufficient documentation

## 2021-08-07 DIAGNOSIS — E119 Type 2 diabetes mellitus without complications: Secondary | ICD-10-CM | POA: Insufficient documentation

## 2021-08-09 ENCOUNTER — Other Ambulatory Visit (HOSPITAL_COMMUNITY): Payer: Self-pay

## 2021-08-12 ENCOUNTER — Telehealth: Payer: Self-pay | Admitting: Pharmacy Technician

## 2021-08-12 NOTE — Telephone Encounter (Signed)
[facility] Patient Advocate Encounter  Prior Authorization for [facility] G[employer] PRODUCTS has been approved.    PA# [id number] Effective dates: 09/12/022 through 08/12/2022      [facility] will continue to follow.   Jeannette How, CPhT Patient Advocate [facility] Phone: [phone] Fax:  [phone]

## 2021-08-15 ENCOUNTER — Other Ambulatory Visit (HOSPITAL_COMMUNITY): Payer: Self-pay

## 2021-08-26 ENCOUNTER — Ambulatory Visit (INDEPENDENT_AMBULATORY_CARE_PROVIDER_SITE_OTHER): Payer: BC Managed Care – PPO | Admitting: Family Medicine

## 2021-08-26 ENCOUNTER — Encounter: Payer: Self-pay | Admitting: Family Medicine

## 2021-08-26 ENCOUNTER — Other Ambulatory Visit: Payer: Self-pay | Admitting: Family Medicine

## 2021-08-26 ENCOUNTER — Other Ambulatory Visit: Payer: Self-pay

## 2021-08-26 VITALS — BP 128/86 | HR 68 | Temp 98.7°F | Resp 16 | Wt 204.4 lb

## 2021-08-26 DIAGNOSIS — E785 Hyperlipidemia, unspecified: Secondary | ICD-10-CM

## 2021-08-26 DIAGNOSIS — Z23 Encounter for immunization: Secondary | ICD-10-CM | POA: Diagnosis not present

## 2021-08-26 DIAGNOSIS — F32A Depression, unspecified: Secondary | ICD-10-CM | POA: Diagnosis not present

## 2021-08-26 DIAGNOSIS — I10 Essential (primary) hypertension: Secondary | ICD-10-CM | POA: Diagnosis not present

## 2021-08-26 DIAGNOSIS — E109 Type 1 diabetes mellitus without complications: Secondary | ICD-10-CM

## 2021-08-26 NOTE — Assessment & Plan Note (Signed)
Tolerating statin, encouraged heart healthy diet, avoid trans fats, minimize simple carbs and saturated fats. Increase exercise as tolerated 

## 2021-08-26 NOTE — Patient Instructions (Addendum)
Ozempic is the diabetes medicine with the side effect of weight loss   Rybelsus is the oral medicine     Paxlovid or Molnupiravir is the new COVID medication we can give you if you get COVID so make sure you test if you have symptoms because we have to treat by day 5 of symptoms for it to be effective. If you are positive let us know so we can treat. If a home test is negative and your symptoms are persistent get a PCR test. Can check testing locations at [email or web address] If you are positive we will make an appointment with [place] and we will send in Paxlovid  or Molnupiravir if you would like it. Check with your pharmacy before we meet to confirm they have it in stock, if they do not then we can get the prescription at the [facility]

## 2021-08-26 NOTE — Progress Notes (Signed)
Subjective:   By signing my name below, I, Bonnie Dickson, attest that this documentation has been prepared under the direction and in the presence of Bonnie Canary, MD. 08/26/2021    Patient ID: Bonnie Dickson, female    DOB: Jan 14, 1971, 50 y.o.   MRN: [id number]  Chief Complaint  Patient presents with   3 months f/u    HPI Patient is in today for an office visit and 3 month f/u  She recently saw an endocrinologist and started lantus injection and has been doing well on it. The highest sugar level has been 202 and the lowest was less than 40 which happened once. She also mentions that when the blood sugar level was at 40, she was not feeling bad or sick. Her normal level is around 120.   She was managing her cholesterol levels with 80 mg Lipitor but was experiencing muscle pain. The dosage was reduced to 40 mg.  She is getting the flu and tetanus vaccines today.   Past Medical History:  Diagnosis Date   CAD (coronary artery disease)    a. NSTEMI 03/2021 s/p DES to LAD.   Depression    situational   Diabetes mellitus without complication (HCC)    Endometriosis    History of chicken pox    HSV-2 (herpes simplex virus 2) infection    Hyperglycemia 07/24/2019   Hyperlipidemia    Hypertension    Insomnia    Ischemic cardiomyopathy    a. EF 45-50% by cath 03/2021.   Snoring    Tonsillitis and adenoiditis, chronic    childhood, caused adult snoring    Past Surgical History:  Procedure Laterality Date   ABDOMINAL HYSTERECTOMY     ovaries left in place   BREAST CYST EXCISION Right    35 years ago    COMBINED ABDOMINOPLASTY AND LIPOSUCTION     CORONARY STENT INTERVENTION N/A 04/01/2021   Procedure: CORONARY STENT INTERVENTION;  Surgeon: Marykay Lex, MD;  Location: [facility] INVASIVE CV LAB;  Service: Cardiovascular;  Laterality: N/A;   ENDOMETRIAL ABLATION     ENDOMETRIAL BIOPSY     LAPAROSCOPIC ENDOMETRIOSIS FULGURATION     LEFT HEART CATH AND CORONARY ANGIOGRAPHY N/A  04/01/2021   Procedure: LEFT HEART CATH AND CORONARY ANGIOGRAPHY;  Surgeon: Marykay Lex, MD;  Location: [facility] INVASIVE CV LAB;  Service: Cardiovascular;  Laterality: N/A;   PARTIAL HYSTERECTOMY     vaginal   TUBAL LIGATION      Family History  Problem Relation Age of Onset   Hypertension Mother    Diabetes Mother        prediabetes   Hyperlipidemia Mother    Diabetes Father    Cataracts Father    Eczema Brother    Asthma Brother    Diabetes Maternal Grandmother    Hypertension Maternal Grandmother    Diabetes Maternal Grandfather    Hypertension Maternal Grandfather    Hyperlipidemia Maternal Grandfather    Heart failure Maternal Grandfather    COPD Maternal Grandfather    ODD Son    ADD / ADHD Son    Diabetes Daughter    Hypertension Daughter    Colon cancer Neg Hx    Stomach cancer Neg Hx    Pancreatic cancer Neg Hx    Esophageal cancer Neg Hx     Social History   Socioeconomic History   Marital status: Widowed    Spouse name: Not on file   Number of children: Not on file  Years of education: Not on file   Highest education level: Not on file  Occupational History   Not on file  Tobacco Use   Smoking status: Every Day    Packs/day: 0.50    Years: 30.00    Pack years: 15.00    Types: Cigarettes   Smokeless tobacco: Never  Vaping Use   Vaping Use: Never used  Substance and Sexual Activity   Alcohol use: Never   Drug use: Never   Sexual activity: Not Currently    Birth control/protection: Surgical  Other Topics Concern   Not on file  Social History Narrative   Not on file   Social Determinants of Health   Financial Resource Strain: Not on file  Food Insecurity: Not on file  Transportation Needs: Not on file  Physical Activity: Not on file  Stress: Not on file  Social Connections: Not on file  Intimate Partner Violence: Not on file    Outpatient Medications Prior to Visit  Medication Sig Dispense Refill   amLODipine (NORVASC) 10 MG tablet  Take 1 tablet (10 mg total) by mouth daily. 30 tablet 6   aspirin 81 MG EC tablet Take 1 tablet (81 mg total) by mouth daily. Swallow whole. 30 tablet 11   atorvastatin (LIPITOR) 40 MG tablet Take 1 tablet (40 mg total) by mouth daily. 90 tablet 3   carvedilol (COREG) 6.25 MG tablet Take 1 tablet (6.25 mg total) by mouth 2 (two) times daily with a meal. 60 tablet 6   clopidogrel (PLAVIX) 75 MG tablet On 8/13 take 4 tablets then starting 8/14, can take 1 tablet by mouth daily 90 tablet 3   Continuous Blood Gluc Sensor (DEXCOM G6 SENSOR) MISC Change every 10 days 9 each 3   Continuous Blood Gluc Transmit (DEXCOM G6 TRANSMITTER) MISC Change every 90 days 1 each 3   insulin glargine (LANTUS SOLOSTAR) 100 UNIT/ML Solostar Pen Inject 22 Units into the skin daily. 15 mL 6   insulin lispro (HUMALOG KWIKPEN) 100 UNIT/ML KwikPen Max daily 30 units per correction scale 15 mL 6   Insulin Pen Needle 32G X 4 MM MISC Use as directed with Humalog up to 4 times a day; in the morning, at noon, in the evening, and at bedtime. 400 each 2   losartan (COZAAR) 50 MG tablet Take 50 mg by mouth in the morning.     nitroGLYCERIN (NITROSTAT) 0.4 MG SL tablet Place 1 tablet (0.4 mg total) under the tongue every 5 (five) minutes as needed for chest pain (up to 3 doses). 25 tablet 3   valACYclovir (VALTREX) 500 MG tablet Take 1 tablet (500 mg total) by mouth 2 (two) times daily. 180 tablet 1   No facility-administered medications prior to visit.    Allergies  Allergen Reactions   Ciprofloxacin Hcl Anaphylaxis   Gentamicin Anaphylaxis   Penicillins Anaphylaxis   Ambien [Zolpidem Tartrate] Other (See Comments)    Sleep walking    Asa [Aspirin] Other (See Comments)    Rectal bleeding    Banana Itching, Swelling and Other (See Comments)    Tongue itches and swells- breathing not impaired   Ivp Dye [Iodinated Diagnostic Agents] Nausea And Vomiting and Other (See Comments)    Severe vomiting    Lisinopril Cough    Metformin And Related Nausea Only and Other (See Comments)    Severe GI upset   Nsaids Other (See Comments)    Rectal bleeding    Pineapple Other (See Comments)  Causes a burning sensation in the mouth   Tape Other (See Comments)    Paper tape "burns the skin"   Codeine Rash   Flagyl [Metronidazole] Rash   Morphine And Related Rash    Review of Systems  Constitutional:  Negative for fever and malaise/fatigue.  HENT:  Negative for congestion.   Eyes:  Negative for redness.  Respiratory:  Negative for shortness of breath.   Cardiovascular:  Negative for chest pain, palpitations and leg swelling.  Gastrointestinal:  Negative for abdominal pain, blood in stool and nausea.  Genitourinary:  Negative for dysuria and frequency.  Musculoskeletal:  Negative for falls.  Skin:  Negative for rash.  Neurological:  Negative for dizziness, loss of consciousness and headaches.  Endo/Heme/Allergies:  Negative for polydipsia.  Psychiatric/Behavioral:  Negative for depression. The patient is not nervous/anxious.       Objective:    Physical Exam Constitutional:      General: She is not in acute distress.    Appearance: She is well-developed.  HENT:     Head: Normocephalic and atraumatic.  Eyes:     Conjunctiva/sclera: Conjunctivae normal.  Neck:     Thyroid: No thyromegaly.  Cardiovascular:     Rate and Rhythm: Normal rate and regular rhythm.     Heart sounds: Normal heart sounds. No murmur heard. Pulmonary:     Effort: Pulmonary effort is normal. No respiratory distress.     Breath sounds: Normal breath sounds.  Abdominal:     General: Bowel sounds are normal. There is no distension.     Palpations: Abdomen is soft. There is no mass.     Tenderness: There is no abdominal tenderness.  Musculoskeletal:     Cervical back: Neck supple.  Lymphadenopathy:     Cervical: No cervical adenopathy.  Skin:    General: Skin is warm and dry.  Neurological:     Mental Status: She is alert  and oriented to person, place, and time.  Psychiatric:        Behavior: Behavior normal.    BP 128/86   Pulse 68   Temp 98.7 F (37.1 C)   Resp 16   Wt 204 lb 6.4 oz (92.7 kg)   SpO2 99%   BMI 32.01 kg/m  Wt Readings from Last 3 Encounters:  08/26/21 204 lb 6.4 oz (92.7 kg)  08/06/21 196 lb 12.8 oz (89.3 kg)  07/11/21 202 lb 11.2 oz (91.9 kg)    Diabetic Foot Exam - Simple   No data filed    Lab Results  Component Value Date   WBC 8.3 05/23/2021   HGB 14.0 05/23/2021   HCT 41.6 05/23/2021   PLT 355.0 05/23/2021   GLUCOSE 129 (H) 05/23/2021   CHOL 121 05/23/2021   TRIG 62.0 05/23/2021   HDL 31.70 (L) 05/23/2021   LDLCALC 76 05/23/2021   ALT 14 05/23/2021   AST 12 05/23/2021   NA 140 05/23/2021   K 4.0 05/23/2021   CL 104 05/23/2021   CREATININE 0.62 05/23/2021   BUN 13 05/23/2021   CO2 26 05/23/2021   TSH 0.326 (L) 04/02/2021   INR 0.9 03/31/2021   HGBA1C 9.3 (A) 08/06/2021    Lab Results  Component Value Date   TSH 0.326 (L) 04/02/2021   Lab Results  Component Value Date   WBC 8.3 05/23/2021   HGB 14.0 05/23/2021   HCT 41.6 05/23/2021   MCV 95.6 05/23/2021   PLT 355.0 05/23/2021   Lab Results  Component Value  Date   NA 140 05/23/2021   K 4.0 05/23/2021   CO2 26 05/23/2021   GLUCOSE 129 (H) 05/23/2021   BUN 13 05/23/2021   CREATININE 0.62 05/23/2021   BILITOT 0.7 05/23/2021   ALKPHOS 94 05/23/2021   AST 12 05/23/2021   ALT 14 05/23/2021   PROT 7.6 05/23/2021   ALBUMIN 4.4 05/23/2021   CALCIUM 9.6 05/23/2021   ANIONGAP 10 04/02/2021   GFR 104.81 05/23/2021   Lab Results  Component Value Date   CHOL 121 05/23/2021   Lab Results  Component Value Date   HDL 31.70 (L) 05/23/2021   Lab Results  Component Value Date   LDLCALC 76 05/23/2021   Lab Results  Component Value Date   TRIG 62.0 05/23/2021   Lab Results  Component Value Date   CHOLHDL 4 05/23/2021   Lab Results  Component Value Date   HGBA1C 9.3 (A) 08/06/2021        Assessment & Plan:   Problem List Items Addressed This Visit     Depression    She has been under a great deal of stress with her job, her health and her son but she feels she is managing well most days      Diabetes mellitus, labile (HCC)    Is following with endocrinology and her numbers have improved since starting on Lantus. Her numbers have generally been below 200 but she has only had one low sugar less than 40 which occurred int he middle of the night and thankfully her Dexcom alarm woke her up for. She will contact endocrinology if she continues to drop. She is asked to consider Ozempic or Rybelsus to help with weight management also      Hyperlipidemia LDL goal <70    Tolerating statin, encouraged heart healthy diet, avoid trans fats, minimize simple carbs and saturated fats. Increase exercise as tolerated      Hypertension    Well controlled, no changes to meds. Encouraged heart healthy diet such as the DASH diet and exercise as tolerated.       Other Visit Diagnoses     Influenza vaccine administered    -  Primary   Relevant Orders   Flu Vaccine QUAD 36+ mos IM (Fluarix, Fluzone & Afluria Quad PF (Completed)   Need for Tdap vaccination       Relevant Orders   Tdap vaccine greater than or equal to 7yo IM (Completed)       No orders of the defined types were placed in this encounter.   I,Bonnie Dickson,acting as a Neurosurgeon for Danise Edge, MD.,have documented all relevant documentation on the behalf of Danise Edge, MD,as directed by  Danise Edge, MD while in the presence of Danise Edge, MD.   I, Bonnie Canary, MD., personally preformed the services described in this documentation.  All medical record entries made by the scribe were at my direction and in my presence.  I have reviewed the chart and discharge instructions (if applicable) and agree that the record reflects my personal performance and is accurate and complete. 08/26/2021

## 2021-08-26 NOTE — Assessment & Plan Note (Signed)
She has been under a great deal of stress with her job, her health and her son but she feels she is managing well most days

## 2021-08-26 NOTE — Assessment & Plan Note (Signed)
Well controlled, no changes to meds. Encouraged heart healthy diet such as the DASH diet and exercise as tolerated.  °

## 2021-08-26 NOTE — Assessment & Plan Note (Signed)
Is following with endocrinology and her numbers have improved since starting on Lantus. Her numbers have generally been below 200 but she has only had one low sugar less than 40 which occurred int he middle of the night and thankfully her Dexcom alarm woke her up for. She will contact endocrinology if she continues to drop. She is asked to consider Ozempic or Rybelsus to help with weight management also

## 2021-08-29 LAB — HM DIABETES EYE EXAM

## 2021-09-03 ENCOUNTER — Other Ambulatory Visit (HOSPITAL_COMMUNITY): Payer: Self-pay

## 2021-09-04 ENCOUNTER — Encounter: Payer: Self-pay | Admitting: Family Medicine

## 2021-09-17 ENCOUNTER — Encounter: Payer: Self-pay | Admitting: Internal Medicine

## 2021-09-21 ENCOUNTER — Other Ambulatory Visit: Payer: Self-pay | Admitting: Family Medicine

## 2021-10-04 ENCOUNTER — Other Ambulatory Visit (HOSPITAL_COMMUNITY): Payer: Self-pay

## 2021-10-04 ENCOUNTER — Telehealth (INDEPENDENT_AMBULATORY_CARE_PROVIDER_SITE_OTHER): Payer: BC Managed Care – PPO | Admitting: Internal Medicine

## 2021-10-04 ENCOUNTER — Encounter (HOSPITAL_BASED_OUTPATIENT_CLINIC_OR_DEPARTMENT_OTHER): Payer: Self-pay | Admitting: Internal Medicine

## 2021-10-04 VITALS — Ht 67.0 in | Wt 204.6 lb

## 2021-10-04 DIAGNOSIS — E785 Hyperlipidemia, unspecified: Secondary | ICD-10-CM | POA: Diagnosis not present

## 2021-10-04 DIAGNOSIS — I10 Essential (primary) hypertension: Secondary | ICD-10-CM | POA: Diagnosis not present

## 2021-10-04 DIAGNOSIS — I2510 Atherosclerotic heart disease of native coronary artery without angina pectoris: Secondary | ICD-10-CM

## 2021-10-04 DIAGNOSIS — I5042 Chronic combined systolic (congestive) and diastolic (congestive) heart failure: Secondary | ICD-10-CM

## 2021-10-04 DIAGNOSIS — Z720 Tobacco use: Secondary | ICD-10-CM

## 2021-10-04 DIAGNOSIS — E119 Type 2 diabetes mellitus without complications: Secondary | ICD-10-CM

## 2021-10-04 MED ORDER — AMLODIPINE BESYLATE 10 MG PO TABS
10.0000 mg | ORAL_TABLET | Freq: Every day | ORAL | 1 refills | Status: DC
  Filled 2021-10-04: qty 90, 90d supply, fill #0
  Filled 2021-12-31: qty 90, 90d supply, fill #1

## 2021-10-04 MED ORDER — ROSUVASTATIN CALCIUM 20 MG PO TABS
20.0000 mg | ORAL_TABLET | Freq: Every day | ORAL | 3 refills | Status: DC
  Filled 2021-10-04: qty 90, 90d supply, fill #0
  Filled 2022-01-31: qty 90, 90d supply, fill #1
  Filled 2022-05-08: qty 90, 90d supply, fill #2
  Filled 2022-06-01 – 2022-08-07 (×2): qty 90, 90d supply, fill #3

## 2021-10-04 NOTE — Progress Notes (Signed)
Virtual Visit via Video Note   This visit type was conducted due to national recommendations for restrictions regarding the COVID-19 Pandemic (e.g. social distancing) in an effort to limit this patient's exposure and mitigate transmission in our community.  Due to her co-morbid illnesses, this patient is at least at moderate risk for complications without adequate follow up.  This format is felt to be most appropriate for this patient at this time.  All issues noted in this document were discussed and addressed.  A limited physical exam was performed with this format.  Please refer to the patient's chart for her consent to telehealth for [facility].      Date:  10/04/2021   ID:  Bonnie Dickson, DOB [birth year], MRN [id number] The patient was identified using 2 identifiers.  Evaluation Performed:  Follow-Up Visit  Patient Location:  [address] [place] [address]  Provider location:   [address] [place], [place] [address]  PCP:  Bradd Canary, MD  Cardiologist:  Chrystie Nose, MD Electrophysiologist:  None   Chief Complaint:  Follow-up NSTEMI, dyslipidemia  History of Present Illness:    Bonnie Dickson is a 50 y.o. female who presents via audio/video conferencing for a telehealth visit today.  She was admitted to the hospital from 03/31/21-04/02/21 for an NSTEMI. Prior to this she was admitted to the hospital 03/25/21-03/26/21 for chest pain where she was found to have minimally elevated HsTrop. Echo 03/26/21 showed EF 60-65%, G2DD, no RWMA, elevated LA pressures, mild LAE, and no significant valvular abnormalities. Her chest pain was felt to be atypical at that time and likely 2/2 hypertensive urgency. Unfortunately she had recurrent chest pain prompting her to present to the ED again. This time her HsTrop peaked at 1057. She underwent a LHC 04/01/21 showing 2-vessel CAD with 95% ostial-proximal LAD stenosis managed with PCI/DES, as well as 50% pRCA  stenosis and 60% ostial PDA stenosis which were meically managed. EF was felt to be mildly reduced with mid apical anterior hypokinesis on cath. She was started on aspirin and brilinta for anticipated 1 year course of DAPT. Metoprolol was transitioned to carvedilol for improved BP control, in addition to increasing amlodipine.   10/04/2021  [name] is seen today in follow-up.  She had recently seen Blima Rich, PA-C and had some complaints of shortness of breath.  It was felt like this might be related to Brilinta and she was switched over to Plavix with an appropriate loading dose.  She said that made a significant difference and she feels a lot better.  She has had marked improvement in her lipids on 80 mg of atorvastatin which was initially at 40 mg but increased as her LDL was greater than 70.  Unfortunately the increase caused her worsening myalgias and she was advised to go back to 40 mg which still causes her some side effects.  I also discussed new guideline recommendations as she would be considered very high risk given her young age and early non-STEMI with coronary artery disease therefore her target LDL should be less than 55.  The patient does not have symptoms concerning for COVID-19 infection (fever, chills, cough, or new SHORTNESS OF BREATH).    Prior CV studies:   The following studies were reviewed today:  Chart reviewed, lab work  PMHx:  Past Medical History:  Diagnosis Date   CAD (coronary artery disease)    a. NSTEMI 03/2021 s/p DES to LAD.   Depression  situational   Diabetes mellitus without complication (HCC)    Endometriosis    History of chicken pox    HSV-2 (herpes simplex virus 2) infection    Hyperglycemia 07/24/2019   Hyperlipidemia    Hypertension    Insomnia    Ischemic cardiomyopathy    a. EF 45-50% by cath 03/2021.   Snoring    Tonsillitis and adenoiditis, chronic    childhood, caused adult snoring    Past Surgical History:  Procedure  Laterality Date   ABDOMINAL HYSTERECTOMY     ovaries left in place   BREAST CYST EXCISION Right    35 years ago    COMBINED ABDOMINOPLASTY AND LIPOSUCTION     CORONARY STENT INTERVENTION N/A 04/01/2021   Procedure: CORONARY STENT INTERVENTION;  Surgeon: Marykay Lex, MD;  Location: [facility] INVASIVE CV LAB;  Service: Cardiovascular;  Laterality: N/A;   ENDOMETRIAL ABLATION     ENDOMETRIAL BIOPSY     LAPAROSCOPIC ENDOMETRIOSIS FULGURATION     LEFT HEART CATH AND CORONARY ANGIOGRAPHY N/A 04/01/2021   Procedure: LEFT HEART CATH AND CORONARY ANGIOGRAPHY;  Surgeon: Marykay Lex, MD;  Location: [facility] INVASIVE CV LAB;  Service: Cardiovascular;  Laterality: N/A;   PARTIAL HYSTERECTOMY     vaginal   TUBAL LIGATION      FAMHx:  Family History  Problem Relation Age of Onset   Hypertension Mother    Diabetes Mother        prediabetes   Hyperlipidemia Mother    Diabetes Father    Cataracts Father    Eczema Brother    Asthma Brother    Diabetes Maternal Grandmother    Hypertension Maternal Grandmother    Diabetes Maternal Grandfather    Hypertension Maternal Grandfather    Hyperlipidemia Maternal Grandfather    Heart failure Maternal Grandfather    COPD Maternal Grandfather    ODD Son    ADD / ADHD Son    Diabetes Daughter    Hypertension Daughter    Colon cancer Neg Hx    Stomach cancer Neg Hx    Pancreatic cancer Neg Hx    Esophageal cancer Neg Hx     SOCHx:   reports that she has been smoking cigarettes. She has a 15.00 pack-year smoking history. She has never used smokeless tobacco. She reports that she does not drink alcohol and does not use drugs.  ALLERGIES:  Allergies  Allergen Reactions   Ciprofloxacin Hcl Anaphylaxis   Gentamicin Anaphylaxis   Penicillins Anaphylaxis   Ambien [Zolpidem Tartrate] Other (See Comments)    Sleep walking    Asa [Aspirin] Other (See Comments)    Rectal bleeding    Banana Itching, Swelling and Other (See Comments)    Tongue itches and  swells- breathing not impaired   Ivp Dye [Iodinated Diagnostic Agents] Nausea And Vomiting and Other (See Comments)    Severe vomiting    Lisinopril Cough   Metformin And Related Nausea Only and Other (See Comments)    Severe GI upset   Nsaids Other (See Comments)    Rectal bleeding    Pineapple Other (See Comments)    Causes a burning sensation in the mouth   Tape Other (See Comments)    Paper tape "burns the skin"   Codeine Rash   Flagyl [Metronidazole] Rash   Morphine And Related Rash    MEDS:  Current Meds  Medication Sig   amLODipine (NORVASC) 10 MG tablet Take 1 tablet (10 mg total) by mouth daily.  aspirin 81 MG EC tablet Take 1 tablet (81 mg total) by mouth daily. Swallow whole.   atorvastatin (LIPITOR) 40 MG tablet Take 1 tablet (40 mg total) by mouth daily.   carvedilol (COREG) 6.25 MG tablet Take 1 tablet (6.25 mg total) by mouth 2 (two) times daily with a meal.   clopidogrel (PLAVIX) 75 MG tablet On 8/13 take 4 tablets then starting 8/14, can take 1 tablet by mouth daily   Continuous Blood Gluc Sensor (DEXCOM G6 SENSOR) MISC Change every 10 days   Continuous Blood Gluc Transmit (DEXCOM G6 TRANSMITTER) MISC Change every 90 days   insulin glargine (LANTUS SOLOSTAR) 100 UNIT/ML Solostar Pen Inject 22 Units into the skin daily.   insulin lispro (HUMALOG KWIKPEN) 100 UNIT/ML KwikPen Max daily 30 units per correction scale   Insulin Pen Needle 32G X 4 MM MISC Use as directed with Humalog up to 4 times a day; in the morning, at noon, in the evening, and at bedtime.   losartan (COZAAR) 50 MG tablet Take 50 mg by mouth in the morning.   nitroGLYCERIN (NITROSTAT) 0.4 MG SL tablet Place 1 tablet (0.4 mg total) under the tongue every 5 (five) minutes as needed for chest pain (up to 3 doses).   valACYclovir (VALTREX) 500 MG tablet Take 1 tablet by mouth twice daily     ROS: Pertinent items noted in HPI and remainder of comprehensive ROS otherwise negative.  Labs/Other Tests  and Data Reviewed:    Recent Labs: 03/31/2021: Magnesium 1.7 04/02/2021: TSH 0.326 05/23/2021: ALT 14; BUN 13; Creatinine, Ser 0.62; Hemoglobin 14.0; Platelets 355.0; Potassium 4.0; Sodium 140   Recent Lipid Panel Lab Results  Component Value Date/Time   CHOL 121 05/23/2021 11:19 AM   TRIG 62.0 05/23/2021 11:19 AM   HDL 31.70 (L) 05/23/2021 11:19 AM   CHOLHDL 4 05/23/2021 11:19 AM   LDLCALC 76 05/23/2021 11:19 AM    Wt Readings from Last 3 Encounters:  10/04/21 204 lb 9.6 oz (92.8 kg)  08/26/21 204 lb 6.4 oz (92.7 kg)  08/06/21 196 lb 12.8 oz (89.3 kg)     Exam:    Vital Signs:  Ht 5\' 7"  (1.702 m)   Wt 204 lb 9.6 oz (92.8 kg)   BMI 32.04 kg/m    General appearance: alert and no distress Lungs: No audible wheezing Abdomen: Mildly obese Extremities: extremities normal, atraumatic, no cyanosis or edema Skin: Skin color, texture, turgor normal. No rashes or lesions Neurologic: Grossly normal Psych: Pleasant  ASSESSMENT & PLAN:    CAD status post PCI/DES to the LAD in 03/2021 Ischemic cardiomyopathy, LVEF 45 to 50% Dyslipidemia -very high risk, goal LDL less than 55 Hypertension Type 2 diabetes Tobacco use  Ms. Wiginton is doing well with regard to her coronary disease.  She is asymptomatic and her breathing has improved after switching to Plavix.  We could possibly discontinue the Plavix in May 2023.  She will need follow-up echo to reevaluate her LVEF probably next year.  Her cholesterol is closer toward old target of 69 however given her very high risk status and new guidelines as of October of this year, I would submit her LDL cholesterol should be even lower at less than 55.  Unfortunately she could not tolerate higher dose atorvastatin and still think she has side effects on the 40 mg of atorvastatin.  I advised stopping it and taking a 2-week statin holiday.  We will then switch over to rosuvastatin 20 mg daily.  Hopefully this  will be better tolerated.  Ultimately she may  need additional therapy such as ezetimibe or PCSK9 inhibitor however she did not seem so enthusiastic about giving herself injections.  Plan follow-up with me in 6 months at which time we will review whether she will need to continue on the clopidogrel or not and we will repeat lipids in about 3 months.  COVID-19 Education: The signs and symptoms of COVID-19 were discussed with the patient and how to seek care for testing (follow up with PCP or arrange E-visit).  The importance of social distancing was discussed today.  Patient Risk:   After full review of this patients clinical status, I feel that they are at least moderate risk at this time.  Time:   Today, I have spent 25 minutes with the patient with telehealth technology discussing everything above.     Medication Adjustments/Labs and Tests Ordered: Current medicines are reviewed at length with the patient today.  Concerns regarding medicines are outlined above.   Tests Ordered: No orders of the defined types were placed in this encounter.   Medication Changes: No orders of the defined types were placed in this encounter.   Disposition:  in 6 month(s)  Chrystie Nose, MD, [facility], FACP  Echo  [facility]  Medical Director of the [facility]  Card[facility]ovascular Risk Reduction [facility] Diplomate of the American Board of Clinical Lipidology Attending Cardiologist  Direct Dial: [phone]  Fax: [phone]  Website:  [email or web address]  Chrystie Nose, MD  10/04/2021 2:50 PM

## 2021-10-04 NOTE — Patient Instructions (Signed)
  Medication Instructions:  STOP atorvastatin   After 2 weeks off this medication, then START rosuvastatin daily  Lab Work: FASTING lipid panel in 3 months to check cholesterol -- complete at any [facility] locations:  [facility] - [address] (Dr. Blanchie Dessert office) - [address] ([facility]) - [address] [phone] [address]  [place] - [address]   [place]  - [address]   [place] - [address] - [address] [employer]  - [address] - [address] ([facility])   Follow-Up: At [facility], you and your health needs are our priority.  As part of our continuing mission to provide you with exceptional heart care, we have created designated Provider Care Teams.  These Care Teams include your primary Cardiologist (physician) and Advanced Practice Providers (APPs -  Physician Assistants and Nurse Practitioners) who all work together to provide you with the care you need, when you need it.  We recommend signing up for the patient portal called "MyChart".  Sign up information is provided on this After Visit Summary.  MyChart is used to connect with patients for Virtual Visits (Telemedicine).  Patients are able to view lab/test results, encounter notes, upcoming appointments, etc.  Non-urgent messages can be sent to your provider as well.   To learn more about what you can do with MyChart, go to [email or web address].    Your next appointment:   6 month(s)  The format for your next appointment:   In Person or Virtual  Provider:   Zoila Shutter MD

## 2021-10-10 ENCOUNTER — Telehealth: Payer: Self-pay | Admitting: *Deleted

## 2021-10-10 ENCOUNTER — Encounter: Payer: Self-pay | Admitting: Student

## 2021-10-10 NOTE — Telephone Encounter (Signed)
Called patient to discuss chest discomfort  Patient staes it occurs  up to twice a day - pinching sensation  can last  a few minutes or a few hours . Most of the time it does not radiate ,but has radiated to the back  No nausea of diaphoresis.     Appointment made for 10/18/21 / patient aware  if symptoms become worse call 911 and go to ER to be evaluated

## 2021-10-10 NOTE — Progress Notes (Deleted)
Cardiology Office Note:    Date:  10/10/2021   ID:  Lus, Kriegel Mar 19, 1971, MRN [id number]  PCP:  Bradd Canary, MD  Cardiologist:  Chrystie Nose, MD  Electrophysiologist:  None   Referring MD: Bradd Canary, MD   Chief Complaint: chest pain  History of Present Illness:    Bonnie Dickson is a 50 y.o. female with a history of CAD with NSTEMI in 03/2021 s/p DES to LAD, ischemic cardiomyopathy with EF of 45-50% on cardiac cath in 03/2021, hypertension, hyperlipidemia, type 2 diabetes mellitus, IBS, tobacco abuse, and multiple medication intolerances who is followed by Dr. Rennis Golden and presents today for further evaluation of chest pain.   Patient was admitted from 03/25/2021 to 03/26/2021 for chest pain where she was found to have a minimally elevated high-sensitivity troponin. Echo showed LVEF of 60-65% with normal wall motion and grade 2 diastolic dysfunction. He chest pain was felt to be atypical at that time and likely secondary to hypertensive urgency. She had recurrent chest pain and was readmitted from 03/31/2021 to 04/02/2021 for NSTEMI. Cardiac catheterization showed 95% ostial to proximal LAD which was felt ot be the culprit lesion and otherwise non-obstructive disease. EF was mildly reduced at 45-50%. She underwent successful PCI with DES to the LAD lesion.   Patient was last seen by Judy Pimple, PA-C, in 07/2021 at which time she denied any exertional chest pain but did report some chest discomfort which she attributed to GI upset as symptoms were relieved with antacids. She also reported worsening myalgias after increasing her Lipitor. Lipitor was decreased to 40mg  daily and she was referred to the lipid clinic. She was seen in Dr. lipid clinic in 10/2021 and decision was made to take a 2-week statin holiday and then switch to Crestor 20mg  daily.  Patient called our office on 10/10/2021 with reports of chest pain. Therefore, this visit was arranged for further  evaluation. ***  Chest Pain History of CAD - History of NSTEMI in 03/2021 s/p DES to LAD. Now presents with *** - EKG shows *** - Continue DAPT with Aspirin and Plavix.  - Continue beta-blocker and high-intensity statin. - ***  Ischemic Cardiomyopathy - LVEF was mildly reduced at 45-50% by cardiac catheterization in 03/2021 although normal at 60-65% by Echo in 03/2021.  - Appears euvolemic on exam.  - Continue Losartan 50mg  daily and Coreg 6.25mg  twice daily. - Continue daily weights and sodium/fluid instructions.   Hypertension - BP well controlled. *** - Continue current medications: Amlodipine 10mg  daily, Losartan 50mg  daily, and Coreg 6.25mg  twice daily.   Hyperlipidemia - Lipid panel in 05/2021: Total Cholesterol 121, Triglycerides 62, HDL 31.70, LDL 76.  - LDL goal <55 given CAD. - Continue Crestor 20mg  daily.  - Patient follows in Dr. 03/2021 lipid clinic. Plan is for repeat lipid panel in 01/2022. If LDL is still above goal at that time, may need Zetia or PCSK9 inhibitor.   Type 2 Diabetes Mellitus - Hemoglobin A1c 9.5 in 03/2021.  - Intolerant to Metformin in the past. Patient has declined SGLT2 inhibitor due to concern for side effects.  - On Insulin (Humalog and Lantus) at home.  - Management per PCP.   Past Medical History:  Diagnosis Date   CAD (coronary artery disease)    a. NSTEMI 03/2021 s/p DES to LAD.   Depression    situational   Diabetes mellitus without complication (HCC)    Endometriosis    History of chicken pox  HSV-2 (herpes simplex virus 2) infection    Hyperglycemia 07/24/2019   Hyperlipidemia    Hypertension    Insomnia    Ischemic cardiomyopathy    a. EF 45-50% by cath 03/2021.   Snoring    Tonsillitis and adenoiditis, chronic    childhood, caused adult snoring    Past Surgical History:  Procedure Laterality Date   ABDOMINAL HYSTERECTOMY     ovaries left in place   BREAST CYST EXCISION Right    35 years ago    COMBINED ABDOMINOPLASTY  AND LIPOSUCTION     CORONARY STENT INTERVENTION N/A 04/01/2021   Procedure: CORONARY STENT INTERVENTION;  Surgeon: Marykay Lex, MD;  Location: [facility] INVASIVE CV LAB;  Service: Cardiovascular;  Laterality: N/A;   ENDOMETRIAL ABLATION     ENDOMETRIAL BIOPSY     LAPAROSCOPIC ENDOMETRIOSIS FULGURATION     LEFT HEART CATH AND CORONARY ANGIOGRAPHY N/A 04/01/2021   Procedure: LEFT HEART CATH AND CORONARY ANGIOGRAPHY;  Surgeon: Marykay Lex, MD;  Location: [facility] INVASIVE CV LAB;  Service: Cardiovascular;  Laterality: N/A;   PARTIAL HYSTERECTOMY     vaginal   TUBAL LIGATION      Current Medications: No outpatient medications have been marked as taking for the 10/18/21 encounter (Appointment) with Corrin Parker, PA-C.     Allergies:   Ciprofloxacin hcl, Gentamicin, Penicillins, Ambien [zolpidem tartrate], Asa [aspirin], Banana, Ivp dye [iodinated diagnostic agents], Lisinopril, Metformin and related, Nsaids, Pineapple, Tape, Codeine, Flagyl [metronidazole], and Morphine and related   Social History   Socioeconomic History   Marital status: Widowed    Spouse name: Not on file   Number of children: Not on file   Years of education: Not on file   Highest education level: Not on file  Occupational History   Not on file  Tobacco Use   Smoking status: Every Day    Packs/day: 0.50    Years: 30.00    Pack years: 15.00    Types: Cigarettes   Smokeless tobacco: Never  Vaping Use   Vaping Use: Never used  Substance and Sexual Activity   Alcohol use: Never   Drug use: Never   Sexual activity: Not Currently    Birth control/protection: Surgical  Other Topics Concern   Not on file  Social History Narrative   Not on file   Social Determinants of Health   Financial Resource Strain: Not on file  Food Insecurity: Not on file  Transportation Needs: Not on file  Physical Activity: Not on file  Stress: Not on file  Social Connections: Not on file     Family History: The patient's  ***family history includes ADD / ADHD in her son; Asthma in her brother; COPD in her maternal grandfather; Cataracts in her father; Diabetes in her daughter, father, maternal grandfather, maternal grandmother, and mother; Eczema in her brother; Heart failure in her maternal grandfather; Hyperlipidemia in her maternal grandfather and mother; Hypertension in her daughter, maternal grandfather, maternal grandmother, and mother; ODD in her son. There is no history of Colon cancer, Stomach cancer, Pancreatic cancer, or Esophageal cancer.  ROS:   Please see the history of present illness.    *** All other systems reviewed and are negative.  EKGs/Labs/Other Studies Reviewed:    The following studies were reviewed today:  Echocardiogram 03/26/2021: Impressions:  1. Left ventricular ejection fraction, by estimation, is 60 to 65%. The  left ventricle has normal function. The left ventricle has no regional  wall motion abnormalities. Left ventricular  diastolic parameters are  consistent with Grade II diastolic  dysfunction (pseudonormalization). Elevated left atrial pressure.   2. Right ventricular systolic function is normal. The right ventricular  size is normal. There is normal pulmonary artery systolic pressure.   3. Left atrial size was mildly dilated.   4. The mitral valve is normal in structure. Trivial mitral valve  regurgitation. No evidence of mitral stenosis.   5. The aortic valve is normal in structure. Aortic valve regurgitation is  not visualized. No aortic stenosis is present.   6. The inferior vena cava is normal in size with greater than 50%  respiratory variability, suggesting right atrial pressure of 3 mmHg.  _______________  Left Cardiac Catheterization 04/01/2021: Culprit Lesion: Ost LAD to Prox LAD lesion is 95% stenosed. A drug-eluting stent was successfully placed using a SYNERGY XD 2.75X16. -> Postdilated in tapered fashion from 4.1 down to 3.0 mm Post intervention, there is  a 0% residual stenosis. Dist LAD lesion is 30% stenosed. ------------------------------ Prox Cx to Mid Cx lesion is 30% stenosed. Prox RCA lesion is 50% stenosed. RPDA lesion is 60% stenosed. --------------------------- There is mild left ventricular systolic dysfunction. The left ventricular ejection fraction is 45-50% by visual estimate -> mid to apical anterior hypokinesis LV end diastolic pressure is mildly elevated.   Summary: Two-vessel CAD: Culprit lesion is severe 95% ostial-proximal LAD-eccentric stenosis; also noted moderate 50% proximal RCA and 60% ostial PDA. Successful DES PCI of ostial proximal LAD using a synergy DES 2.75 mm x 16 mm postdilated and taper fashion from 4.1 to 3.0 mm. Mildly reduced EF with mid apical anterior hypokinesis. Systemic Hypertension with mild to moderately elevated LVEDP.   Recommendation: Return to nursing unit for ongoing care and TR band removal. Aggressive blood pressure management-wean off nitroglycerin. High-dose statin CR[facility] 1 consult   Anticipate discharge tomorrow, if medically stable.  Diagnostic Dominance: Right Intervention    EKG:  EKG ordered today. EKG personally reviewed and demonstrates ***.  Recent Labs: 03/31/2021: Magnesium 1.7 04/02/2021: TSH 0.326 05/23/2021: ALT 14; BUN 13; Creatinine, Ser 0.62; Hemoglobin 14.0; Platelets 355.0; Potassium 4.0; Sodium 140  Recent Lipid Panel    Component Value Date/Time   CHOL 121 05/23/2021 1119   TRIG 62.0 05/23/2021 1119   HDL 31.70 (L) 05/23/2021 1119   CHOLHDL 4 05/23/2021 1119   VLDL 12.4 05/23/2021 1119   LDLCALC 76 05/23/2021 1119    Physical Exam:    Vital Signs: There were no vitals taken for this visit.    Wt Readings from Last 3 Encounters:  10/04/21 204 lb 9.6 oz (92.8 kg)  08/26/21 204 lb 6.4 oz (92.7 kg)  08/06/21 196 lb 12.8 oz (89.3 kg)     General: 50 y.o. female in no acute distress. HEENT: Normocephalic and atraumatic. Sclera clear. EOMs  intact. Neck: Supple. No carotid bruits. No JVD. Heart: *** RRR. Distinct S1 and S2. No murmurs, gallops, or rubs. Radial and distal pedal pulses 2+ and equal bilaterally. Lungs: No increased work of breathing. Clear to ausculation bilaterally. No wheezes, rhonchi, or rales.  Abdomen: Soft, non-distended, and non-tender to palpation. Bowel sounds present in all 4 quadrants.  MSK: Normal strength and tone for age. *** Extremities: No lower extremity edema.    Skin: Warm and dry. Neuro: Alert and oriented x3. No focal deficits. Psych: Normal affect. Responds appropriately.   Assessment:    No diagnosis found.  Plan:     Disposition: Follow up in ***   Medication Adjustments/Labs and Tests  Ordered: Current medicines are reviewed at length with the patient today.  Concerns regarding medicines are outlined above.  No orders of the defined types were placed in this encounter.  No orders of the defined types were placed in this encounter.   There are no Patient Instructions on file for this visit.   Signed, Corrin Parker, PA-C  10/10/2021 3:49 PM    [facility]

## 2021-10-10 NOTE — Telephone Encounter (Signed)
Bonnie Dickson  Patient Appointment Schedule Request Pool 23 hours ago (8:45 AM)   TP Appointment Request From: Bonnie Dickson   With Provider: Beatriz Stallion, PA-C [facility]]   Preferred Date Range: Any   Preferred Times: Monday Morning, Tuesday Morning, Wednesday Morning, Thursday Morning, Friday Morning   Reason for visit: Office Visit   Comments: Recent chest discomfort not pain. Upper right chest with some mild exertional SOB.

## 2021-10-16 ENCOUNTER — Other Ambulatory Visit (HOSPITAL_COMMUNITY): Payer: Self-pay

## 2021-10-18 ENCOUNTER — Ambulatory Visit: Payer: BC Managed Care – PPO | Admitting: Student

## 2021-10-31 ENCOUNTER — Other Ambulatory Visit (HOSPITAL_COMMUNITY): Payer: Self-pay

## 2021-10-31 DIAGNOSIS — M545 Low back pain, unspecified: Secondary | ICD-10-CM | POA: Diagnosis not present

## 2021-10-31 MED ORDER — CYCLOBENZAPRINE HCL 5 MG PO TABS
5.0000 mg | ORAL_TABLET | Freq: Two times a day (BID) | ORAL | 0 refills | Status: DC
  Filled 2021-10-31: qty 14, 7d supply, fill #0

## 2021-10-31 MED ORDER — LIDOCAINE 5 % EX PTCH
1.0000 | MEDICATED_PATCH | Freq: Every day | CUTANEOUS | 0 refills | Status: DC
  Filled 2021-10-31: qty 30, 30d supply, fill #0

## 2021-11-01 ENCOUNTER — Other Ambulatory Visit: Payer: Self-pay

## 2021-11-01 ENCOUNTER — Encounter: Payer: Self-pay | Admitting: Adult Health

## 2021-11-01 ENCOUNTER — Other Ambulatory Visit (HOSPITAL_COMMUNITY): Payer: Self-pay

## 2021-11-01 ENCOUNTER — Ambulatory Visit (INDEPENDENT_AMBULATORY_CARE_PROVIDER_SITE_OTHER): Payer: BC Managed Care – PPO | Admitting: Adult Health

## 2021-11-01 VITALS — BP 138/82 | HR 75 | Ht 67.0 in | Wt 213.4 lb

## 2021-11-01 DIAGNOSIS — Z8719 Personal history of other diseases of the digestive system: Secondary | ICD-10-CM

## 2021-11-01 DIAGNOSIS — R0789 Other chest pain: Secondary | ICD-10-CM

## 2021-11-01 DIAGNOSIS — I2510 Atherosclerotic heart disease of native coronary artery without angina pectoris: Secondary | ICD-10-CM | POA: Diagnosis not present

## 2021-11-01 DIAGNOSIS — I10 Essential (primary) hypertension: Secondary | ICD-10-CM

## 2021-11-01 DIAGNOSIS — Z720 Tobacco use: Secondary | ICD-10-CM | POA: Diagnosis not present

## 2021-11-01 DIAGNOSIS — E7800 Pure hypercholesterolemia, unspecified: Secondary | ICD-10-CM

## 2021-11-01 MED ORDER — VARENICLINE TARTRATE 1 MG PO TABS
1.0000 mg | ORAL_TABLET | Freq: Two times a day (BID) | ORAL | 2 refills | Status: DC
Start: 2021-11-01 — End: 2022-01-03
  Filled 2021-11-01 (×2): qty 60, 30d supply, fill #0
  Filled 2021-12-05: qty 60, 30d supply, fill #1
  Filled 2021-12-31: qty 60, 30d supply, fill #2

## 2021-11-01 NOTE — Progress Notes (Signed)
Cardiology Office Note   Date:  11/01/2021   ID:  [name], Bonnie Dickson 11/03/1971, MRN [id number]  PCP:  Bradd Canary, MD  Cardiologist:  [facility] No chief complaint on file.    History of Present Illness: Bonnie Dickson is a 50 y.o. female who presents for complaints of pinching chest pain.  She has a history of atypical chest discomfort and was related to hypertensive urgency in the past.  She did have a cardiac catheterization in May 2022 revealing two-vessel CAD with a 95% ostial proximal LAD stenosis which is requirement of PCI DES as well as a 50% proximal RCA stenosis and a 60% ostial PDA stenosis which was medically managed.  EF was felt to be mildly reduced in the mid and apical anterior per cath.  She was started on aspirin and Brilinta for 1 year.  She is recently seen in the office on 10/04/2021 on follow-up that she had had some complaints of shortness of breath which was related to Brilinta, and had been switched to Plavix after appropriate loading dose on 10/04/2021.  She stated that it had made a significant difference and she felt a lot better concerning her breathing status.  She was complaining of myalgia pain with higher dose of atorvastatin and this was decreased to 40 mg daily.  Due to her high risk of recurrent cardiac event with her young age her new level of LDL should be less than 55.  The patient called our office on 10/10/2021 stating that she was having pinching chest pain which lasted for a few minutes to a few hours radiating to the back without associated nausea diaphoresis or dyspnea.  She was advised to call 911 and go to the ER if the pain became worse.  She was she had an appointment today for follow-up.  She comes today stating that the pain is completely subsided.  She it feels like a gas bubble on the right side of her chest underneath her right breast.  Burping helps.  She drinks something carbonated and the pain is relieved.  She has since had some other  issues concerning her back and is now wearing a back brace.  She has had a long history of GI issues but not been established with a GI specialist at this time.  She unfortunately continues to smoke and is requesting help with this.  She states she can go periods of time when she is not smoking, several weeks or months, but then gets back into the habit of smoking again.  She knows the risks of continuing to smoke and would like to stop altogether.   Past Medical History:  Diagnosis Date   CAD (coronary artery disease)    a. NSTEMI 03/2021 s/p DES to LAD.   Depression    situational   Diabetes mellitus without complication (HCC)    Endometriosis    History of chicken pox    HSV-2 (herpes simplex virus 2) infection    Hyperglycemia 07/24/2019   Hyperlipidemia    Hypertension    Insomnia    Snoring    Tonsillitis and adenoiditis, chronic    childhood, caused adult snoring    Past Surgical History:  Procedure Laterality Date   ABDOMINAL HYSTERECTOMY     ovaries left in place   BREAST CYST EXCISION Right    35 years ago    COMBINED ABDOMINOPLASTY AND LIPOSUCTION     CORONARY STENT INTERVENTION N/A 04/01/2021   Procedure: CORONARY STENT INTERVENTION;  Surgeon: Herbie Baltimore,  Piedad Climes, MD;  Location: [facility] INVASIVE CV LAB;  Service: Cardiovascular;  Laterality: N/A;   ENDOMETRIAL ABLATION     ENDOMETRIAL BIOPSY     LAPAROSCOPIC ENDOMETRIOSIS FULGURATION     LEFT HEART CATH AND CORONARY ANGIOGRAPHY N/A 04/01/2021   Procedure: LEFT HEART CATH AND CORONARY ANGIOGRAPHY;  Surgeon: Marykay Lex, MD;  Location: [facility] INVASIVE CV LAB;  Service: Cardiovascular;  Laterality: N/A;   PARTIAL HYSTERECTOMY     vaginal   TUBAL LIGATION       Current Outpatient Medications  Medication Sig Dispense Refill   amLODipine (NORVASC) 10 MG tablet Take 1 tablet (10 mg total) by mouth daily. 90 tablet 1   aspirin 81 MG EC tablet Take 1 tablet (81 mg total) by mouth daily. Swallow whole. 30 tablet 11   carvedilol  (COREG) 6.25 MG tablet Take 1 tablet (6.25 mg total) by mouth 2 (two) times daily with a meal. 60 tablet 6   clopidogrel (PLAVIX) 75 MG tablet On 8/13 take 4 tablets then starting 8/14, can take 1 tablet by mouth daily 90 tablet 3   Continuous Blood Gluc Sensor (DEXCOM G6 SENSOR) MISC Change every 10 days 9 each 3   Continuous Blood Gluc Transmit (DEXCOM G6 TRANSMITTER) MISC Change every 90 days 1 each 3   cyclobenzaprine (FLEXERIL) 5 MG tablet Take 1 tablet (5 mg total) by mouth 2 (two) times daily. 14 tablet 0   insulin glargine (LANTUS SOLOSTAR) 100 UNIT/ML Solostar Pen Inject 22 Units into the skin daily. 15 mL 6   insulin lispro (HUMALOG KWIKPEN) 100 UNIT/ML KwikPen Max daily 30 units per correction scale 15 mL 6   Insulin Pen Needle 32G X 4 MM MISC Use as directed with Humalog up to 4 times a day; in the morning, at noon, in the evening, and at bedtime. 400 each 2   lidocaine (LIDODERM) 5 % Place 1 patch onto the skin once daily. (May wear up to 12 hours) 30 patch 0   losartan (COZAAR) 50 MG tablet Take 50 mg by mouth in the morning.     nitroGLYCERIN (NITROSTAT) 0.4 MG SL tablet Place 1 tablet (0.4 mg total) under the tongue every 5 (five) minutes as needed for chest pain (up to 3 doses). 25 tablet 3   rosuvastatin (CRESTOR) 20 MG tablet Take 1 tablet (20 mg total) by mouth daily. 90 tablet 3   valACYclovir (VALTREX) 500 MG tablet Take 1 tablet by mouth twice daily 180 tablet 1   No current facility-administered medications for this visit.    Allergies:   Ciprofloxacin hcl, Gentamicin, Penicillins, Ambien [zolpidem tartrate], Asa [aspirin], Banana, Ivp dye [iodinated diagnostic agents], Lisinopril, Metformin and related, Nsaids, Pineapple, Tape, Codeine, Flagyl [metronidazole], and Morphine and related    Social History:  The patient  reports that she has been smoking cigarettes. She has a 15.00 pack-year smoking history. She has never used smokeless tobacco. She reports that she does not  drink alcohol and does not use drugs.   Family History:  The patient's family history includes ADD / ADHD in her son; Asthma in her brother; COPD in her maternal grandfather; Cataracts in her father; Diabetes in her daughter, father, maternal grandfather, maternal grandmother, and mother; Eczema in her brother; Heart failure in her maternal grandfather; Hyperlipidemia in her maternal grandfather and mother; Hypertension in her daughter, maternal grandfather, maternal grandmother, and mother; ODD in her son.    ROS: All other systems are reviewed and negative. Unless otherwise mentioned  in H&P    PHYSICAL EXAM: VS:  There were no vitals taken for this visit. , BMI There is no height or weight on file to calculate BMI. GEN: Well nourished, well developed, in no acute distress HEENT: normal Neck: no JVD, carotid bruits, or masses Cardiac: RRR; no murmurs, rubs, or gallops,no edema  Respiratory:  Clear to auscultation bilaterally, normal work of breathing GI: soft, nontender, nondistended, + BS MS: no deformity or atrophy, wearing back brace  Skin: warm and dry, no rash Neuro:  Strength and sensation are intact Psych: euthymic mood, full affect   EKG:  EKG is ordered today.   Normal sinus rhythm ventricular rate of 75 bpm, evidence of prior lateral infarct.  (Personally reviewed)   Recent Labs: 03/31/2021: Magnesium 1.7 04/02/2021: TSH 0.326 05/23/2021: ALT 14; BUN 13; Creatinine, Ser 0.62; Hemoglobin 14.0; Platelets 355.0; Potassium 4.0; Sodium 140    Lipid Panel    Component Value Date/Time   CHOL 121 05/23/2021 1119   TRIG 62.0 05/23/2021 1119   HDL 31.70 (L) 05/23/2021 1119   CHOLHDL 4 05/23/2021 1119   VLDL 12.4 05/23/2021 1119   LDLCALC 76 05/23/2021 1119      Wt Readings from Last 3 Encounters:  10/04/21 204 lb 9.6 oz (92.8 kg)  08/26/21 204 lb 6.4 oz (92.7 kg)  08/06/21 196 lb 12.8 oz (89.3 kg)      Other studies Reviewed: Cardiac Cath 04/01/2021 Culprit Lesion: Ost  LAD to Prox LAD lesion is 95% stenosed. A drug-eluting stent was successfully placed using a SYNERGY XD 2.75X16. -> Postdilated in tapered fashion from 4.1 down to 3.0 mm Post intervention, there is a 0% residual stenosis. Dist LAD lesion is 30% stenosed. ------------------------------ Prox Cx to Mid Cx lesion is 30% stenosed. Prox RCA lesion is 50% stenosed. RPDA lesion is 60% stenosed. --------------------------- There is mild left ventricular systolic dysfunction. The left ventricular ejection fraction is 45-50% by visual estimate -> mid to apical anterior hypokinesis LV end diastolic pressure is mildly elevated.   SUMMARY Two-vessel CAD: Culprit lesion is severe 95% ostial-proximal LAD-eccentric stenosis; also noted moderate 50% proximal RCA and 60% ostial PDA. Successful DES PCI of ostial proximal LAD using a synergy DES 2.75 mm x 16 mm postdilated and taper fashion from 4.1 to 3.0 mm. Mildly reduced EF with mid apical anterior hypokinesis. Systemic Hypertension with mild to moderately elevated LVEDP.   RECOMMENDATION Return to nursing unit for ongoing care and TR band removal. Aggressive blood pressure management-wean off nitroglycerin. High-dose statin [facility] 1 consult   Anticipate discharge tomorrow, if medically stable.       Bryan Lemma, MD  ASSESSMENT AND PLAN:  1. Noncardiac chest pain: Is not at all like the pain she had prior to her stent placement 1 year ago.  Burping helps it to go away.  Located on the right side of her chest underneath her right breast.  Has not occurred in several weeks.  She was concerned with her known history of CAD and wanted to make sure there was nothing new going on.  I have given her reassurance that her blood pressure is well controlled, she is not having exertional chest pain, heart rate is under control and EKG is unchanged.  We will continue current medication regimen.  If pain recurs, begins worse or is similar to pain she had prior to  her intervention she is to call us.  2.  Ongoing tobacco abuse: Patient is desperate to quit smoking.  She states  that she has periods of remission, and actually stopped smoking for 10 months when she was pregnant with her child.  But that was many years ago.  She has smoked for very long time and has been able to quit for shorter periods of time but has gone back to her habit.  I have offered her life coaching, motivational interviewing, and medical management.  She would like to start Chantix that she had been in on past and that helped her to quit smoking altogether.  I will begin her on a starter pack of Chantix with 2 refills, but have also encouraged her to speak with a life coach in order to help her to change her habits and stop smoking for good as the Chantix is only there for 3 months.  She verbalizes understanding.  She is started a new job and there are wellness links at her office site.  I have encouraged her to connect with them to help her with smoking cessation.  3.  Hyperlipidemia: Continues on statin therapy.  Goal of LDL less than 55 as described above.  Lab work is always drawn by her primary care physician and she does have an appointment with her on January 31.  We will review the labs once they are made available.    Current medicines are reviewed at length with the patient today.  I have spent 25 min's  dedicated to the care of this patient on the date of this encounter to include pre-visit review of records, assessment, management and diagnostic testing,with shared decision making.  Labs/ tests ordered today include:None    Bettey Mare. Liborio Nixon, ANP, [facility]   11/01/2021 11:24 AM    [facility] [address] Office [phone] Fax [phone]  Notice: This dictation was prepared with Dragon dictation along with smaller phrase technology. Any transcriptional errors that result from this process are unintentional and may not be  corrected upon review.

## 2021-11-01 NOTE — Patient Instructions (Signed)
Medication Instructions:  Start Chantix 1 mg ( Take 1 Tablet Twice Daily). *If you need a refill on your cardiac medications before your next appointment, please call your pharmacy*   Lab Work: No Labs If you have labs (blood work) drawn today and your tests are completely normal, you will receive your results only by: MyChart Message (if you have MyChart) OR A paper copy in the mail If you have any lab test that is abnormal or we need to change your treatment, we will call you to review the results.   Testing/Procedures: No Testing   Follow-Up: At [facility], you and your health needs are our priority.  As part of our continuing mission to provide you with exceptional heart care, we have created designated Provider Care Teams.  These Care Teams include your primary Cardiologist (physician) and Advanced Practice Providers (APPs -  Physician Assistants and Nurse Practitioners) who all work together to provide you with the care you need, when you need it.  We recommend signing up for the patient portal called "MyChart".  Sign up information is provided on this After Visit Summary.  MyChart is used to connect with patients for Virtual Visits (Telemedicine).  Patients are able to view lab/test results, encounter notes, upcoming appointments, etc.  Non-urgent messages can be sent to your provider as well.   To learn more about what you can do with MyChart, go to [email or web address].    Your next appointment:   2 month(s)  The format for your next appointment:   In Person  Provider:   Joni Reining, DNP, ANP    Then, Chrystie Nose, MD will plan to see you again in 6 month(s).

## 2021-11-05 ENCOUNTER — Encounter: Payer: Self-pay | Admitting: Internal Medicine

## 2021-11-05 ENCOUNTER — Other Ambulatory Visit (HOSPITAL_COMMUNITY): Payer: Self-pay

## 2021-11-05 ENCOUNTER — Telehealth: Payer: Self-pay

## 2021-11-05 ENCOUNTER — Ambulatory Visit (INDEPENDENT_AMBULATORY_CARE_PROVIDER_SITE_OTHER): Payer: BC Managed Care – PPO | Admitting: Internal Medicine

## 2021-11-05 VITALS — BP 144/92 | HR 77 | Ht 67.0 in | Wt 217.0 lb

## 2021-11-05 DIAGNOSIS — E1165 Type 2 diabetes mellitus with hyperglycemia: Secondary | ICD-10-CM | POA: Diagnosis not present

## 2021-11-05 DIAGNOSIS — E1159 Type 2 diabetes mellitus with other circulatory complications: Secondary | ICD-10-CM

## 2021-11-05 LAB — POCT GLYCOSYLATED HEMOGLOBIN (HGB A1C): Hemoglobin A1C: 8.5 % — AB (ref 4.0–5.6)

## 2021-11-05 MED ORDER — LANTUS SOLOSTAR 100 UNIT/ML ~~LOC~~ SOPN
20.0000 [IU] | PEN_INJECTOR | Freq: Every day | SUBCUTANEOUS | 6 refills | Status: DC
  Filled 2021-11-05 – 2021-12-26 (×3): qty 15, 75d supply, fill #0

## 2021-11-05 MED ORDER — OZEMPIC (0.25 OR 0.5 MG/DOSE) 2 MG/1.5ML ~~LOC~~ SOPN
0.5000 mg | PEN_INJECTOR | SUBCUTANEOUS | 3 refills | Status: DC
  Filled 2021-11-05 (×2): qty 1.5, 28d supply, fill #0
  Filled 2021-12-05: qty 1.5, 28d supply, fill #1
  Filled 2021-12-31: qty 1.5, 28d supply, fill #2
  Filled 2022-01-31: qty 1.5, 28d supply, fill #3

## 2021-11-05 NOTE — Telephone Encounter (Signed)
Patient Advocate Encounter  Prior Authorization for Ozempic 2mg /1.10ml pen injector has been approved.    PA# [id number]  Effective dates: 11/05/2021 through 11/05/2022  Per Test Claim Patients co-pay is $0.  Key#: [id number]   Spoke with Pharmacy to Process.  Patient Advocate Fax:  [phone]

## 2021-11-05 NOTE — Patient Instructions (Signed)
-   Continue Lantus 20 units daily  - Start Ozempic 0.25 mg WEEKLY for 6 weeks, then increase to 0.5 mg weekly -Humalog correctional insulin:  Use the scale below to help guide you before each meal   Blood sugar before meal Number of units to inject  Less than 160 0 unit  161 -  195 1 units  196 -  220 2 units  221 -  250 3 units  251-  280 4 units  281 -  310 5 units  311 -  340 6 units  341 -  370 7 units  371 -  400 8 units  401 - 430 9 units      HOW TO TREAT LOW BLOOD SUGARS (Blood sugar LESS THAN 70 MG/DL) Please follow the RULE OF 15 for the treatment of hypoglycemia treatment (when your (blood sugars are less than 70 mg/dL)   STEP 1: Take 15 grams of carbohydrates when your blood sugar is low, which includes:  3-4 GLUCOSE TABS  OR 3-4 OZ OF JUICE OR REGULAR SODA OR ONE TUBE OF GLUCOSE GEL    STEP 2: RECHECK blood sugar in 15 MINUTES STEP 3: If your blood sugar is still low at the 15 minute recheck --> then, go back to STEP 1 and treat AGAIN with another 15 grams of carbohydrates.

## 2021-11-05 NOTE — Progress Notes (Signed)
Name: Bonnie Dickson  MRN/ DOB: [id number], 1971/10/20   Age/ Sex: 50 y.o., female    PCP: Bradd Canary, MD   Reason for Endocrinology Evaluation: Type 2 Diabetes Mellitus     Date of Initial Endocrinology Visit: 08/06/2021    PATIENT IDENTIFIER: Bonnie Dickson is a 50 y.o. female with a past medical history of HTN, T2DM, dyslipidemia, CAD . The patient presented for initial endocrinology clinic visit on 08/06/2021 for consultative assistance with her diabetes management.    DIABETIC HISTORY:  Bonnie Dickson  was diagnosed with DM in 2010. Metformin- GI upset. Was on Insulin in 2010-2011 and actos. Stopped all meds 01/2010 and was diet controlled until 10/2020 that she attributes to prednisone due to allergic reaction to Flagyl. Her hemoglobin A1c has ranged from 6.5% in 2020, peaking at 11.0% in 2021.   On her initial visit with me she had an A1c of 9.3% she was on HUmalog SS only. We started Lantus and provided her with Correction scale    SUBJECTIVE:   During the last visit (08/06/2021): A1c 9.3% Started Lantus and continued Humalog per CS.   Today (11/05/21): Bonnie Dickson is here for a follow up diabetes management.  She  checks her blood sugars multiple  times daily, through the CGM. The patient has not had hypoglycemic episodes since the last clinic visit  She has not been able to dexcom download the app  Denies nausea, vomiting or diarrhea at  this time   HOME DIABETES REGIMEN: Humalog (BG-130/35)  Lantus 20 units daily     Statin: yes ACE-I/ARB: yes Prior Diabetic Education: no    METER DOWNLOAD SUMMARY: Did not bring       DIABETIC COMPLICATIONS: Microvascular complications:   Denies: CKD , neuropathy, retinopathy Last eye exam: pending  9/28th/2022  Macrovascular complications:  CAD Denies:  PVD, CVA   PAST HISTORY: Past Medical History:  Past Medical History:  Diagnosis Date   CAD (coronary artery disease)    a. NSTEMI 03/2021 s/p DES to LAD.    Depression    situational   Diabetes mellitus without complication (HCC)    Endometriosis    History of chicken pox    HSV-2 (herpes simplex virus 2) infection    Hyperglycemia 07/24/2019   Hyperlipidemia    Hypertension    Insomnia    Snoring    Tonsillitis and adenoiditis, chronic    childhood, caused adult snoring   Past Surgical History:  Past Surgical History:  Procedure Laterality Date   ABDOMINAL HYSTERECTOMY     ovaries left in place   BREAST CYST EXCISION Right    35 years ago    COMBINED ABDOMINOPLASTY AND LIPOSUCTION     CORONARY STENT INTERVENTION N/A 04/01/2021   Procedure: CORONARY STENT INTERVENTION;  Surgeon: Marykay Lex, MD;  Location: [facility] INVASIVE CV [facility];  Service: Cardiovascular;  Laterality: N/A;   ENDOMETRIAL ABLATION     ENDOMETRIAL BIOPSY     LAPAROSCOPIC ENDOMETRIOSIS FULGURATION     LEFT HEART CATH AND CORONARY ANGIOGRAPHY N/A 04/01/2021   Procedure: LEFT HEART CATH AND CORONARY ANGIOGRAPHY;  Surgeon: Marykay Lex, MD;  Location: [facility] INVASIVE CV LAB;  Service: Cardiovascular;  Laterality: N/A;   PARTIAL HYSTERECTOMY     vaginal   TUBAL LIGATION      Social History:  reports that she has been smoking cigarettes. She has a 15.00 pack-year smoking history. She has never used smokeless tobacco. She reports that she does not drink alcohol  and does not use drugs. Family History:  Family History  Problem Relation Age of Onset   Hypertension Mother    Diabetes Mother        prediabetes   Hyperlipidemia Mother    Diabetes Father    Cataracts Father    Eczema Brother    Asthma Brother    Diabetes Maternal Grandmother    Hypertension Maternal Grandmother    Diabetes Maternal Grandfather    Hypertension Maternal Grandfather    Hyperlipidemia Maternal Grandfather    Heart failure Maternal Grandfather    COPD Maternal Grandfather    ODD Son    ADD / ADHD Son    Diabetes Daughter    Hypertension Daughter    Colon cancer Neg Hx    Stomach cancer  Neg Hx    Pancreatic cancer Neg Hx    Esophageal cancer Neg Hx      HOME MEDICATIONS: Allergies as of 11/05/2021       Reactions   Ciprofloxacin Hcl Anaphylaxis   Gentamicin Anaphylaxis   Penicillins Anaphylaxis   Ambien [zolpidem Tartrate] Other (See Comments)   Sleep walking   Asa [aspirin] Other (See Comments)   Rectal bleeding   Banana Itching, Swelling, Other (See Comments)   Tongue itches and swells- breathing not impaired   Ivp Dye [iodinated Diagnostic Agents] Nausea And Vomiting, Other (See Comments)   Severe vomiting   Lisinopril Cough   Metformin And Related Nausea Only, Other (See Comments)   Severe GI upset   Nsaids Other (See Comments)   Rectal bleeding   Pineapple Other (See Comments)   Causes a burning sensation in the mouth   Tape Other (See Comments)   Paper tape "burns the skin"   Codeine Rash   Flagyl [metronidazole] Rash   Morphine And Related Rash        Medication List        Accurate as of November 05, 2021  2:11 PM. If you have any questions, ask your nurse or doctor.          amLODipine 10 MG tablet Commonly known as: NORVASC Take 1 tablet (10 mg total) by mouth daily.   Aspirin Low Dose 81 MG EC tablet Generic drug: aspirin Take 1 tablet (81 mg total) by mouth daily. Swallow whole.   BD Pen Needle Nano U/F 32G X 4 MM Misc Generic drug: Insulin Pen Needle Use as directed with Humalog up to 4 times a day; in the morning, at noon, in the evening, and at bedtime.   carvedilol 6.25 MG tablet Commonly known as: COREG Take 1 tablet (6.25 mg total) by mouth 2 (two) times daily with a meal.   clopidogrel 75 MG tablet Commonly known as: PLAVIX On 8/13 take 4 tablets then starting 8/14, can take 1 tablet by mouth daily   cyclobenzaprine 5 MG tablet Commonly known as: FLEXERIL Take 1 tablet (5 mg total) by mouth 2 (two) times daily.   Dexcom G6 Sensor Misc Change every 10 days   Dexcom G6 Transmitter Misc Change every 90 days    insulin lispro 100 UNIT/ML KwikPen Commonly known as: HumaLOG KwikPen Max daily 30 units per correction scale   Lantus SoloStar 100 UNIT/ML Solostar Pen Generic drug: insulin glargine Inject 22 Units into the skin daily.   lidocaine 5 % Commonly known as: Lidoderm Place 1 patch onto the skin once daily. (May wear up to 12 hours)   nitroGLYCERIN 0.4 MG SL tablet Commonly known as: Futures trader  1 tablet (0.4 mg total) under the tongue every 5 (five) minutes as needed for chest pain (up to 3 doses).   Ozempic (0.25 or 0.5 MG/DOSE) 2 MG/1.5ML Sopn Generic drug: Semaglutide(0.25 or 0.5MG /DOS) Inject 0.5 mg into the skin once a week. Started by: Bonnie Shorts, MD   rosuvastatin 20 MG tablet Commonly known as: CRESTOR Take 1 tablet (20 mg total) by mouth daily.   valACYclovir 500 MG tablet Commonly known as: VALTREX Take 1 tablet by mouth twice daily   varenicline 1 MG tablet Commonly known as: Chantix Continuing Month Pak Take 1 tablet (1 mg total) by mouth 2 (two) times daily.         ALLERGIES: Allergies  Allergen Reactions   Ciprofloxacin Hcl Anaphylaxis   Gentamicin Anaphylaxis   Penicillins Anaphylaxis   Ambien [Zolpidem Tartrate] Other (See Comments)    Sleep walking    Asa [Aspirin] Other (See Comments)    Rectal bleeding    Banana Itching, Swelling and Other (See Comments)    Tongue itches and swells- breathing not impaired   Ivp Dye [Iodinated Diagnostic Agents] Nausea And Vomiting and Other (See Comments)    Severe vomiting    Lisinopril Cough   Metformin And Related Nausea Only and Other (See Comments)    Severe GI upset   Nsaids Other (See Comments)    Rectal bleeding    Pineapple Other (See Comments)    Causes a burning sensation in the mouth   Tape Other (See Comments)    Paper tape "burns the skin"   Codeine Rash   Flagyl [Metronidazole] Rash   Morphine And Related Rash     REVIEW OF SYSTEMS: A comprehensive ROS was  conducted with the patient and is negative except as per HPI and below:  Review of Systems  Gastrointestinal:  Negative for diarrhea and nausea.     OBJECTIVE:   VITAL SIGNS: BP (!) 144/92 (BP Location: Left Arm, Patient Position: Sitting, Cuff Size: Large)   Pulse 77   Ht 5\' 7"  (1.702 m)   Wt 217 lb (98.4 kg)   SpO2 95%   BMI 33.99 kg/m    PHYSICAL EXAM:  General: Pt appears well and is in NAD  Lungs: Clear with good BS bilat with no rales, rhonchi, or wheezes  Heart: RRR with normal S1 and S2 and no gallops; no murmurs; no rub  Extremities:  Lower extremities - No pretibial edema. No lesions.  Neuro: MS is good with appropriate affect, pt is alert and Ox3    DM foot exam: 08/06/2021   The skin of the feet is intact without sores or ulcerations. The pedal pulses are 2+ on right and 2+ on left. The sensation is intact to a screening 5.07, 10 gram monofilament bilaterally     DATA REVIEWED:  Lab Results  Component Value Date   HGBA1C 8.5 (A) 11/05/2021   HGBA1C 9.3 (A) 08/06/2021   HGBA1C 9.5 (H) 03/31/2021   Lab Results  Component Value Date   LDLCALC 76 05/23/2021   CREATININE 0.62 05/23/2021   No results found for: [facility]  Lab Results  Component Value Date   CHOL 121 05/23/2021   HDL 31.70 (L) 05/23/2021   LDLCALC 76 05/23/2021   TRIG 62.0 05/23/2021   CHOLHDL 4 05/23/2021        ASSESSMENT / PLAN / RECOMMENDATIONS:   1) Type 2 Diabetes Mellitus, Poorly controlled, With macrovascular  complications - Most recent A1c of 8.5 %. Goal A1c <  7.0 %.     - A1c down from 9.3% to 8.5 % Praised the pt on improved glycemic control  -She has declined  SGLT2 inhibitors.  Her friend was on [place] and developed necrotizing fasciitis - She is intolerant to Metformin - She is requesting Ozempic, she has no hx of pancreatitis. Cautioned against GO side effects   MEDICATIONS: - Lantus 20 units daily  - Start Ozempic 0.25 mg weekly for 6 weeks, then increase  to 0.5 mg weekly  - Humalog correctional insulin: (BG-130/35)   EDUCATION / INSTRUCTIONS: BG monitoring instructions: Patient is instructed to check her blood sugars 3 times a day, before meals. Call [facility] if: BG persistently < 70  I reviewed the Rule of 15 for the treatment of hypoglycemia in detail with the patient. Literature supplied.   2) Diabetic complications:  Eye: Does not have known diabetic retinopathy.  Neuro/ Feet: Does not have known diabetic peripheral neuropathy. Renal: Patient does not have known baseline CKD. She is on an ACEI/ARB at present.  3) CAD/ Dyslipidemia : Patient is on a statin, she has a pending referral to lipidologist as she is having arthralgia to statins therapy  - Follows with cardiology     Signed electronically by: Lyndle Herrlich, MD  [facility]  [facility] [address] [place] [place], [place] [address] Phone: [phone] FAX: [phone]   CC: Bradd Canary, MD [address] [place] [address] Phone: [phone]  Fax: [phone]    Return to Endocrinology clinic as below: Future Appointments  Date Time Provider Department Center  12/31/2021  2:20 PM Bradd Canary, MD [facility]  01/03/2022  1:55 PM Jodelle Gross, NP [facility]  05/26/2022  3:15 PM Hilty, Lisette Abu, MD [facility]

## 2021-11-05 NOTE — Telephone Encounter (Signed)
Patient needs PA on Ozempic

## 2021-11-06 ENCOUNTER — Other Ambulatory Visit: Payer: Self-pay | Admitting: Physician Assistant

## 2021-11-06 ENCOUNTER — Other Ambulatory Visit (HOSPITAL_COMMUNITY): Payer: Self-pay

## 2021-11-06 MED ORDER — CARVEDILOL 6.25 MG PO TABS
6.2500 mg | ORAL_TABLET | Freq: Two times a day (BID) | ORAL | 6 refills | Status: DC
  Filled 2021-11-06: qty 60, 30d supply, fill #0
  Filled 2021-12-05: qty 60, 30d supply, fill #1
  Filled 2021-12-31: qty 60, 30d supply, fill #2
  Filled 2022-01-31: qty 60, 30d supply, fill #3
  Filled 2022-03-03: qty 60, 30d supply, fill #4
  Filled 2022-03-31: qty 60, 30d supply, fill #5
  Filled 2022-05-08: qty 60, 30d supply, fill #6

## 2021-11-06 NOTE — Telephone Encounter (Signed)
This is Dr. Hilty's pt 

## 2021-11-11 ENCOUNTER — Encounter: Payer: Self-pay | Admitting: Family Medicine

## 2021-11-11 DIAGNOSIS — U071 COVID-19: Secondary | ICD-10-CM

## 2021-11-11 MED ORDER — MOLNUPIRAVIR EUA 200MG CAPSULE: 4.0000 | ORAL_CAPSULE | Freq: Two times a day (BID) | ORAL | 0 refills | Status: AC

## 2021-11-12 ENCOUNTER — Telehealth: Payer: BC Managed Care – PPO | Admitting: Internal Medicine

## 2021-11-13 DIAGNOSIS — M5416 Radiculopathy, lumbar region: Secondary | ICD-10-CM | POA: Diagnosis not present

## 2021-11-20 DIAGNOSIS — M5416 Radiculopathy, lumbar region: Secondary | ICD-10-CM | POA: Diagnosis not present

## 2021-12-05 ENCOUNTER — Encounter: Payer: Self-pay | Admitting: Family Medicine

## 2021-12-05 ENCOUNTER — Other Ambulatory Visit (HOSPITAL_COMMUNITY): Payer: Self-pay

## 2021-12-13 ENCOUNTER — Ambulatory Visit: Payer: BC Managed Care – PPO | Admitting: General Practice

## 2021-12-24 DIAGNOSIS — M5416 Radiculopathy, lumbar region: Secondary | ICD-10-CM | POA: Diagnosis not present

## 2021-12-26 ENCOUNTER — Other Ambulatory Visit (HOSPITAL_COMMUNITY): Payer: Self-pay

## 2021-12-31 ENCOUNTER — Ambulatory Visit (INDEPENDENT_AMBULATORY_CARE_PROVIDER_SITE_OTHER): Payer: BC Managed Care – PPO | Admitting: Family Medicine

## 2021-12-31 ENCOUNTER — Other Ambulatory Visit (HOSPITAL_COMMUNITY): Payer: Self-pay

## 2021-12-31 VITALS — BP 124/80 | HR 93 | Resp 18 | Ht 67.0 in | Wt 213.8 lb

## 2021-12-31 DIAGNOSIS — R87610 Atypical squamous cells of undetermined significance on cytologic smear of cervix (ASC-US): Secondary | ICD-10-CM

## 2021-12-31 DIAGNOSIS — Z1211 Encounter for screening for malignant neoplasm of colon: Secondary | ICD-10-CM | POA: Diagnosis not present

## 2021-12-31 DIAGNOSIS — I10 Essential (primary) hypertension: Secondary | ICD-10-CM

## 2021-12-31 DIAGNOSIS — M25552 Pain in left hip: Secondary | ICD-10-CM

## 2021-12-31 DIAGNOSIS — E785 Hyperlipidemia, unspecified: Secondary | ICD-10-CM

## 2021-12-31 DIAGNOSIS — E109 Type 1 diabetes mellitus without complications: Secondary | ICD-10-CM | POA: Diagnosis not present

## 2021-12-31 DIAGNOSIS — F17200 Nicotine dependence, unspecified, uncomplicated: Secondary | ICD-10-CM | POA: Diagnosis not present

## 2021-12-31 MED ORDER — TIZANIDINE HCL 2 MG PO TABS
1.0000 mg | ORAL_TABLET | Freq: Two times a day (BID) | ORAL | 1 refills | Status: DC | PRN
Start: 2021-12-31 — End: 2023-09-13
  Filled 2021-12-31: qty 40, 10d supply, fill #0
  Filled 2022-07-24: qty 40, 10d supply, fill #1

## 2021-12-31 NOTE — Assessment & Plan Note (Signed)
Has been taking Chantix for 2 months and is down from 3/4 ppd to max of 1 cig qod, encouraged complete cessation

## 2021-12-31 NOTE — Patient Instructions (Addendum)
Daily Miralax with Benefiber once to twice daily  Encouraged increased hydration and fiber in diet. Daily probiotics. If bowels not moving can use MOM 2 tbls po in 4 oz of warm prune juice by mouth every 2-3 days. If no results then repeat in 4 hours with  Dulcolax suppository pr, may repeat again in 4 more hours as needed. Seek care if symptoms worsen. Consider daily Miralax and/or Dulcolax if symptoms persist.    Tobacco Use Disorder Tobacco use disorder (TUD) occurs when a person craves, seeks, and uses tobacco, regardless of the consequences. This disorder can cause problems with mental and physical health. It can affect your ability to have healthy relationships, and it can keep you from meeting your responsibilities at work, home, or school. Tobacco may be: Smoked as a cigarette or cigar. Inhaled using e-cigarettes. Smoked in a pipe or hookah. Chewed as smokeless tobacco. Inhaled into the nostrils as snuff. Tobacco products contain a dangerous chemical called nicotine, which is very addictive. Nicotine triggers hormones that make the body feel stimulated and works on areas of the brain that make you feel good. These effects can make it hard for people to quit nicotine. Tobacco contains many other unsafe chemicals that can damage almost every organ in the body. Smoking tobacco also puts others in danger due to fire risk and possible health problems caused by breathing in secondhand smoke. What are the signs or symptoms? Symptoms of TUD may include: Being unable to slow down or stop your tobacco use. Spending an abnormal amount of time getting or using tobacco. Craving tobacco. Cravings may last for up to 6 months after quitting. Tobacco use that: Interferes with your work, school, or home life. Interferes with your personal and social relationships. Makes you give up activities that you once enjoyed or found important. Using tobacco even though you know that it is: Dangerous or bad for  your health or someone else's health. Causing problems in your life. Needing more and more of the substance to get the same effect (developing tolerance). Experiencing unpleasant symptoms if you do not use the substance (withdrawal). Withdrawal symptoms may include: Depressed, anxious, or irritable mood. Difficulty concentrating. Increased appetite. Restlessness or trouble sleeping. Using the substance to avoid withdrawal. How is this diagnosed? This condition may be diagnosed based on: Your current and past tobacco use. Your health care provider may ask questions about how your tobacco use affects your life. A physical exam. You may be diagnosed with TUD if you have at least two symptoms within a 101-month period. How is this treated? This condition is treated by stopping tobacco use. Many people are unable to quit on their own and need help. Treatment may include: Nicotine replacement therapy (NRT). NRT provides nicotine without the other harmful chemicals in tobacco. NRT gradually lowers the dosage of nicotine in the body and reduces withdrawal symptoms. NRT is available as: Over-the-counter gums, lozenges, and skin patches. Prescription mouth inhalers and nasal sprays. Medicine that acts on the brain to reduce cravings and withdrawal symptoms. A type of talk therapy that examines your triggers for tobacco use, how to avoid them, and how to cope with cravings (behavioral therapy). Hypnosis. This may help with withdrawal symptoms. Joining a support group for others coping with TUD. The best treatment for TUD is usually a combination of medicine, talk therapy, and support groups. Recovery can be a long process. Many people start using tobacco again after stopping (relapse). If you relapse, it does not mean that treatment  will not work. Follow these instructions at home: Lifestyle Do not use any products that contain nicotine or tobacco, such as cigarettes and e-cigarettes. Avoid things  that trigger tobacco use as much as you can. Triggers include people and situations that usually cause you to use tobacco. Avoid drinks that contain caffeine, including coffee. These may worsen some withdrawal symptoms. Find ways to manage stress. Wanting to smoke may cause stress, and stress can make you want to smoke. Relaxation techniques such as deep breathing, meditation, and yoga may help. Attend support groups as needed. These groups are an important part of long-term recovery for many people. General instructions Take over-the-counter and prescription medicines only as told by your health care provider. Check with your health care provider before taking any new prescription or over-the-counter medicines. Decide on a friend, family member, or smoking quit-line (such as 1-800-QUIT-NOW in the [place] that you can call or text when you feel the urge to smoke or when you need help coping with cravings. Keep all follow-up visits as told by your health care provider and therapist. This is important. Contact a health care provider if: You are not able to take your medicines as prescribed. Your symptoms get worse, even with treatment. Summary Tobacco use disorder (TUD) occurs when a person craves, seeks, and uses tobacco regardless of the consequences. This condition may be diagnosed based on your current and past tobacco use and a physical exam. Many people are unable to quit on their own and need help. Recovery can be a long process. The most effective treatment for TUD is usually a combination of medicine, talk therapy, and support groups. This information is not intended to replace advice given to you by your health care provider. Make sure you discuss any questions you have with your health care provider. Document Revised: 07/26/2021 Document Reviewed: 10/09/2020 Elsevier Patient Education  2022 [employer].

## 2021-12-31 NOTE — Assessment & Plan Note (Signed)
Well controlled, no changes to meds. Encouraged heart healthy diet such as the DASH diet and exercise as tolerated.  °

## 2021-12-31 NOTE — Assessment & Plan Note (Signed)
hgba1c acceptable, minimize simple carbs. Increase exercise as tolerated. Continue current meds 

## 2022-01-01 DIAGNOSIS — M25552 Pain in left hip: Secondary | ICD-10-CM | POA: Insufficient documentation

## 2022-01-01 DIAGNOSIS — R87610 Atypical squamous cells of undetermined significance on cytologic smear of cervix (ASC-US): Secondary | ICD-10-CM | POA: Insufficient documentation

## 2022-01-01 LAB — CBC
HCT: 41.1 % (ref 36.0–46.0)
Hemoglobin: 13.4 g/dL (ref 12.0–15.0)
MCHC: 32.7 g/dL (ref 30.0–36.0)
MCV: 95.7 fl (ref 78.0–100.0)
Platelets: 393 10*3/uL (ref 150.0–400.0)
RBC: 4.29 Mil/uL (ref 3.87–5.11)
RDW: 14.1 % (ref 11.5–15.5)
WBC: 10.7 10*3/uL — ABNORMAL HIGH (ref 4.0–10.5)

## 2022-01-01 LAB — COMPREHENSIVE METABOLIC PANEL
ALT: 11 U/L (ref 0–35)
AST: 11 U/L (ref 0–37)
Albumin: 4.5 g/dL (ref 3.5–5.2)
Alkaline Phosphatase: 78 U/L (ref 39–117)
BUN: 15 mg/dL (ref 6–23)
CO2: 32 mEq/L (ref 19–32)
Calcium: 10.2 mg/dL (ref 8.4–10.5)
Chloride: 102 mEq/L (ref 96–112)
Creatinine, Ser: 0.9 mg/dL (ref 0.40–1.20)
GFR: 74.97 mL/min (ref >60.00)
Glucose, Bld: 133 mg/dL — ABNORMAL HIGH (ref 70–99)
Potassium: 3.9 mEq/L (ref 3.5–5.1)
Sodium: 141 mEq/L (ref 135–145)
Total Bilirubin: 0.5 mg/dL (ref 0.2–1.2)
Total Protein: 7.6 g/dL (ref 6.0–8.3)

## 2022-01-01 LAB — LIPID PANEL
Cholesterol: 129 mg/dL (ref 0–200)
HDL: 36.9 mg/dL — ABNORMAL LOW (ref >39.00)
LDL Cholesterol: 67 mg/dL (ref 0–99)
NonHDL: 92.4
Total CHOL/HDL Ratio: 4
Triglycerides: 128 mg/dL (ref 0.0–149.0)
VLDL: 25.6 mg/dL (ref 0.0–40.0)

## 2022-01-01 LAB — TSH: TSH: 1.46 u[IU]/mL (ref 0.35–5.50)

## 2022-01-01 NOTE — Assessment & Plan Note (Signed)
Encouraged moist heat and gentle stretching as tolerated. May try NSAIDs and prescription meds as directed and report if symptoms worsen or seek immediate care. She is following with [facility].

## 2022-01-01 NOTE — Assessment & Plan Note (Signed)
Referred to OBGYN for further evaluation

## 2022-01-01 NOTE — Progress Notes (Signed)
Subjective:    Patient ID: Bonnie Dickson, female    DOB: 09/06/1971, 51 y.o.   MRN: [id number]  Chief Complaint  Patient presents with   4 month follow up    Concerns/ questions: pt says she had some back injections last week that do not seem to have helped. Foot exam and MA due Eye exam: 08/29/21: [facility]    HPI Patient is in today for follow up on chronic medical concerns. No recent febrile illness or hospitalizations.  She had COVID in December and does feel she is largely recovered from that.  She is struggling with significant left hip pain and is following with [facility] for that.  She has been started on Chantix and is down to only 1 cigarette every day or every couple of days and is hoping to fully quit.  No other acute concerns noted at this time. Denies CP/palp/SOB/HA/congestion/fevers/GI or GU c/o. Taking meds as prescribed   Past Medical History:  Diagnosis Date   CAD (coronary artery disease)    a. NSTEMI 03/2021 s/p DES to LAD.   Depression    situational   Diabetes mellitus without complication (HCC)    Endometriosis    History of chicken pox    HSV-2 (herpes simplex virus 2) infection    Hyperglycemia 07/24/2019   Hyperlipidemia    Hypertension    Insomnia    Snoring    Tonsillitis and adenoiditis, chronic    childhood, caused adult snoring    Past Surgical History:  Procedure Laterality Date   ABDOMINAL HYSTERECTOMY     ovaries left in place   BREAST CYST EXCISION Right    35 years ago    COMBINED ABDOMINOPLASTY AND LIPOSUCTION     CORONARY STENT INTERVENTION N/A 04/01/2021   Procedure: CORONARY STENT INTERVENTION;  Surgeon: Marykay Lex, MD;  Location: [facility] INVASIVE [facility];  Service: Cardiovascular;  Laterality: N/A;   ENDOMETRIAL ABLATION     ENDOMETRIAL BIOPSY     LAPAROSCOPIC ENDOMETRIOSIS FULGURATION     LEFT HEART CATH AND CORONARY ANGIOGRAPHY N/A 04/01/2021   Procedure: LEFT HEART CATH AND CORONARY ANGIOGRAPHY;   Surgeon: Marykay Lex, MD;  Location: [facility] INVASIVE CV LAB;  Service: Cardiovascular;  Laterality: N/A;   PARTIAL HYSTERECTOMY     vaginal   TUBAL LIGATION      Family History  Problem Relation Age of Onset   Hypertension Mother    Diabetes Mother        prediabetes   Hyperlipidemia Mother    Diabetes Father    Cataracts Father    Eczema Brother    Asthma Brother    Diabetes Maternal Grandmother    Hypertension Maternal Grandmother    Diabetes Maternal Grandfather    Hypertension Maternal Grandfather    Hyperlipidemia Maternal Grandfather    Heart failure Maternal Grandfather    COPD Maternal Grandfather    ODD Son    ADD / ADHD Son    Diabetes Daughter    Hypertension Daughter    Colon cancer Neg Hx    Stomach cancer Neg Hx    Pancreatic cancer Neg Hx    Esophageal cancer Neg Hx     Social History   Socioeconomic History   Marital status: Widowed    Spouse name: Not on file   Number of children: Not on file   Years of education: Not on file   Highest education level: Not on file  Occupational History  Not on file  Tobacco Use   Smoking status: Every Day    Packs/day: 0.50    Years: 30.00    Pack years: 15.00    Types: Cigarettes   Smokeless tobacco: Never  Vaping Use   Vaping Use: Never used  Substance and Sexual Activity   Alcohol use: Never   Drug use: Never   Sexual activity: Not Currently    Birth control/protection: Surgical  Other Topics Concern   Not on file  Social History Narrative   Not on file   Social Determinants of Health   Financial Resource Strain: Not on file  Food Insecurity: Not on file  Transportation Needs: Not on file  Physical Activity: Not on file  Stress: Not on file  Social Connections: Not on file  Intimate Partner Violence: Not on file    Outpatient Medications Prior to Visit  Medication Sig Dispense Refill   amLODipine (NORVASC) 10 MG tablet Take 1 tablet (10 mg total) by mouth daily. 90 tablet 1   aspirin 81  MG EC tablet Take 1 tablet (81 mg total) by mouth daily. Swallow whole. 30 tablet 11   carvedilol (COREG) 6.25 MG tablet Take 1 tablet (6.25 mg total) by mouth 2 (two) times daily with a meal. 60 tablet 6   clopidogrel (PLAVIX) 75 MG tablet On 8/13 take 4 tablets then starting 8/14, can take 1 tablet by mouth daily 90 tablet 3   Continuous Blood Gluc Sensor (DEXCOM G6 SENSOR) MISC Change every 10 days 9 each 3   Continuous Blood Gluc Transmit (DEXCOM G6 TRANSMITTER) MISC Change every 90 days 1 each 3   insulin glargine (LANTUS SOLOSTAR) 100 UNIT/ML Solostar Pen Inject 20 Units into the skin daily. 15 mL 6   insulin lispro (HUMALOG KWIKPEN) 100 UNIT/ML KwikPen Max daily 30 units per correction scale 15 mL 6   Insulin Pen Needle 32G X 4 MM MISC Use as directed with Humalog up to 4 times a day; in the morning, at noon, in the evening, and at bedtime. 400 each 2   nitroGLYCERIN (NITROSTAT) 0.4 MG SL tablet Place 1 tablet (0.4 mg total) under the tongue every 5 (five) minutes as needed for chest pain (up to 3 doses). 25 tablet 3   rosuvastatin (CRESTOR) 20 MG tablet Take 1 tablet (20 mg total) by mouth daily. 90 tablet 3   Semaglutide,0.25 or 0.5MG /DOS, (OZEMPIC, 0.25 OR 0.5 MG/DOSE,) 2 MG/1.5ML SOPN Inject 0.5 mg into the skin once a week. 1.5 mL 3   valACYclovir (VALTREX) 500 MG tablet Take 1 tablet by mouth twice daily 180 tablet 1   varenicline (CHANTIX CONTINUING MONTH PAK) 1 MG tablet Take 1 tablet (1 mg total) by mouth 2 (two) times daily. 60 tablet 2   cyclobenzaprine (FLEXERIL) 5 MG tablet Take 1 tablet (5 mg total) by mouth 2 (two) times daily. 14 tablet 0   lidocaine (LIDODERM) 5 % Place 1 patch onto the skin once daily. (May wear up to 12 hours) 30 patch 0   No facility-administered medications prior to visit.    Allergies  Allergen Reactions   Ciprofloxacin Hcl Anaphylaxis   Gentamicin Anaphylaxis   Penicillins Anaphylaxis   Ambien [Zolpidem Tartrate] Other (See Comments)    Sleep  walking    Asa [Aspirin] Other (See Comments)    Rectal bleeding    Banana Itching, Swelling and Other (See Comments)    Tongue itches and swells- breathing not impaired   Ivp Dye [Iodinated Contrast Media]  Nausea And Vomiting and Other (See Comments)    Severe vomiting    Lisinopril Cough   Metformin And Related Nausea Only and Other (See Comments)    Severe GI upset   Nsaids Other (See Comments)    Rectal bleeding    Pineapple Other (See Comments)    Causes a burning sensation in the mouth   Tape Other (See Comments)    Paper tape "burns the skin"   Codeine Rash   Flagyl [Metronidazole] Rash   Morphine And Related Rash    Review of Systems  Constitutional:  Positive for malaise/fatigue. Negative for fever.  HENT:  Negative for congestion.   Eyes:  Negative for blurred vision.  Respiratory:  Negative for shortness of breath.   Cardiovascular:  Negative for chest pain, palpitations and leg swelling.  Gastrointestinal:  Negative for abdominal pain, blood in stool and nausea.  Genitourinary:  Negative for dysuria and frequency.  Musculoskeletal:  Positive for joint pain and myalgias. Negative for falls.  Skin:  Negative for rash.  Neurological:  Negative for dizziness, loss of consciousness and headaches.  Endo/Heme/Allergies:  Negative for environmental allergies.  Psychiatric/Behavioral:  Negative for depression. The patient is not nervous/anxious.       Objective:    Physical Exam Constitutional:      General: She is not in acute distress.    Appearance: She is well-developed.  HENT:     Head: Normocephalic and atraumatic.  Eyes:     Conjunctiva/sclera: Conjunctivae normal.  Neck:     Thyroid: No thyromegaly.  Cardiovascular:     Rate and Rhythm: Normal rate and regular rhythm.     Heart sounds: Normal heart sounds. No murmur heard. Pulmonary:     Effort: Pulmonary effort is normal. No respiratory distress.     Breath sounds: Normal breath sounds.   Abdominal:     General: Bowel sounds are normal. There is no distension.     Palpations: Abdomen is soft. There is no mass.     Tenderness: There is no abdominal tenderness.  Musculoskeletal:     Cervical back: Neck supple.  Lymphadenopathy:     Cervical: No cervical adenopathy.  Skin:    General: Skin is warm and dry.  Neurological:     Mental Status: She is alert and oriented to person, place, and time.  Psychiatric:        Behavior: Behavior normal.    BP 124/80 (BP Location: Left Arm, Patient Position: Sitting, Cuff Size: Large)    Pulse 93    Resp 18    Ht 5\' 7"  (1.702 m)    Wt 213 lb 12.8 oz (97 kg)    SpO2 97%    BMI 33.49 kg/m  Wt Readings from Last 3 Encounters:  12/31/21 213 lb 12.8 oz (97 kg)  11/05/21 217 lb (98.4 kg)  11/01/21 213 lb 6.4 oz (96.8 kg)    Diabetic Foot Exam - Simple   No data filed    Lab Results  Component Value Date   WBC 10.7 (H) 12/31/2021   HGB 13.4 12/31/2021   HCT 41.1 12/31/2021   PLT 393.0 12/31/2021   GLUCOSE 133 (H) 12/31/2021   CHOL 129 12/31/2021   TRIG 128.0 12/31/2021   HDL 36.90 (L) 12/31/2021   LDLCALC 67 12/31/2021   ALT 11 12/31/2021   AST 11 12/31/2021   NA 141 12/31/2021   K 3.9 12/31/2021   CL 102 12/31/2021   CREATININE 0.90 12/31/2021   BUN  15 12/31/2021   CO2 32 12/31/2021   TSH 1.46 12/31/2021   INR 0.9 03/31/2021   HGBA1C 8.5 (A) 11/05/2021    Lab Results  Component Value Date   TSH 1.46 12/31/2021   Lab Results  Component Value Date   WBC 10.7 (H) 12/31/2021   HGB 13.4 12/31/2021   HCT 41.1 12/31/2021   MCV 95.7 12/31/2021   PLT 393.0 12/31/2021   Lab Results  Component Value Date   NA 141 12/31/2021   K 3.9 12/31/2021   CO2 32 12/31/2021   GLUCOSE 133 (H) 12/31/2021   BUN 15 12/31/2021   CREATININE 0.90 12/31/2021   BILITOT 0.5 12/31/2021   ALKPHOS 78 12/31/2021   AST 11 12/31/2021   ALT 11 12/31/2021   PROT 7.6 12/31/2021   ALBUMIN 4.5 12/31/2021   CALCIUM 10.2 12/31/2021    ANIONGAP 10 04/02/2021   GFR 74.97 12/31/2021   Lab Results  Component Value Date   CHOL 129 12/31/2021   Lab Results  Component Value Date   HDL 36.90 (L) 12/31/2021   Lab Results  Component Value Date   LDLCALC 67 12/31/2021   Lab Results  Component Value Date   TRIG 128.0 12/31/2021   Lab Results  Component Value Date   CHOLHDL 4 12/31/2021   Lab Results  Component Value Date   HGBA1C 8.5 (A) 11/05/2021       Assessment & Plan:   Problem List Items Addressed This Visit     Diabetes mellitus, labile (HCC)    hgba1c acceptable, minimize simple carbs. Increase exercise as tolerated. Continue current meds      Hyperlipidemia LDL goal <70   Relevant Orders   Lipid panel (Completed)   Hypertension    Well controlled, no changes to meds. Encouraged heart healthy diet such as the DASH diet and exercise as tolerated.       Relevant Orders   CBC (Completed)   Comprehensive metabolic panel (Completed)   TSH (Completed)   Tobacco dependence    Has been taking Chantix for 2 months and is down from 3/4 ppd to max of 1 cig qod, encouraged complete cessation      Pap smear abnormality of cervix with ASCUS favoring benign    Referred to OB/GYN for further evaluation      Relevant Orders   Ambulatory referral to Obstetrics / Gynecology   Left hip pain    Encouraged moist heat and gentle stretching as tolerated. May try NSAIDs and prescription meds as directed and report if symptoms worsen or seek immediate care. She is following with [facility].      Other Visit Diagnoses     Colon cancer screening    -  Primary   Relevant Orders   Ambulatory referral to Gastroenterology       I have discontinued Bonnie Dickson's cyclobenzaprine and lidocaine. I am also having her start on tiZANidine. Additionally, I am having her maintain her aspirin, nitroGLYCERIN, clopidogrel, Dexcom G6 Transmitter, Dexcom G6 Sensor, Insulin Pen Needle, insulin lispro, valACYclovir,  rosuvastatin, amLODipine, varenicline, Ozempic (0.25 or 0.5 MG/DOSE), Lantus SoloStar, and carvedilol.  Meds ordered this encounter  Medications   tiZANidine (ZANAFLEX) 2 MG tablet    Sig: Take 0.5-2 tablets (1-4 mg total) by mouth 2 (two) times daily as needed for muscle spasms.    Dispense:  40 tablet    Refill:  1     Danise Edge, MD

## 2022-01-03 ENCOUNTER — Ambulatory Visit (INDEPENDENT_AMBULATORY_CARE_PROVIDER_SITE_OTHER): Payer: BC Managed Care – PPO | Admitting: Nurse Practitioner

## 2022-01-03 ENCOUNTER — Other Ambulatory Visit: Payer: Self-pay

## 2022-01-03 ENCOUNTER — Encounter: Payer: Self-pay | Admitting: Nurse Practitioner

## 2022-01-03 ENCOUNTER — Other Ambulatory Visit (HOSPITAL_COMMUNITY): Payer: Self-pay

## 2022-01-03 VITALS — BP 132/78 | HR 84 | Ht 67.0 in | Wt 213.8 lb

## 2022-01-03 DIAGNOSIS — I5042 Chronic combined systolic (congestive) and diastolic (congestive) heart failure: Secondary | ICD-10-CM

## 2022-01-03 DIAGNOSIS — E785 Hyperlipidemia, unspecified: Secondary | ICD-10-CM | POA: Diagnosis not present

## 2022-01-03 DIAGNOSIS — I2510 Atherosclerotic heart disease of native coronary artery without angina pectoris: Secondary | ICD-10-CM | POA: Diagnosis not present

## 2022-01-03 DIAGNOSIS — Z720 Tobacco use: Secondary | ICD-10-CM

## 2022-01-03 DIAGNOSIS — I10 Essential (primary) hypertension: Secondary | ICD-10-CM

## 2022-01-03 MED ORDER — AMLODIPINE BESYLATE 10 MG PO TABS
10.0000 mg | ORAL_TABLET | Freq: Every day | ORAL | 1 refills | Status: DC
  Filled 2022-01-03: qty 90, 90d supply, fill #0
  Filled 2022-03-31: qty 90, 90d supply, fill #1

## 2022-01-03 MED ORDER — VARENICLINE TARTRATE 1 MG PO TABS
1.0000 mg | ORAL_TABLET | Freq: Two times a day (BID) | ORAL | 2 refills | Status: DC
  Filled 2022-01-03: qty 60, 30d supply, fill #0
  Filled 2022-01-31: qty 60, 30d supply, fill #1
  Filled 2022-03-03: qty 60, 30d supply, fill #2

## 2022-01-03 NOTE — Progress Notes (Signed)
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  Past Medical History    Past Medical History:  Diagnosis Date   CAD (coronary artery disease)    a. NSTEMI 03/2021 s/p DES to LAD.   Depression    situational   Diabetes mellitus without complication ([place])    Endometriosis    History of chicken pox    HSV-2 (herpes simplex virus 2) infection    Hyperglycemia 07/24/2019   Hyperlipidemia    Hypertension    Insomnia    Snoring    Tonsillitis and adenoiditis, chronic    childhood, caused adult snoring   Past Surgical History:  Procedure Laterality Date   ABDOMINAL HYSTERECTOMY     ovaries left in place   BREAST CYST EXCISION Right    35 years ago    COMBINED ABDOMINOPLASTY AND LIPOSUCTION     CORONARY STENT INTERVENTION N/A 04/01/2021   Procedure: CORONARY STENT INTERVENTION;  Surgeon: Leonie Man, MD;  Location: [place] [facility];  Service: Cardiovascular;  Laterality: N/A;   ENDOMETRIAL ABLATION     ENDOMETRIAL BIOPSY     LAPAROSCOPIC ENDOMETRIOSIS FULGURATION     LEFT HEART CATH AND CORONARY ANGIOGRAPHY N/A 04/01/2021   Procedure: LEFT HEART CATH AND CORONARY ANGIOGRAPHY;  Surgeon: Leonie Man, MD;  Location: [place] [facility];  Service: Cardiovascular;  Laterality: N/A;   PARTIAL HYSTERECTOMY     vaginal   TUBAL LIGATION      Allergies  Allergies  Allergen Reactions   Ciprofloxacin Hcl Anaphylaxis   Gentamicin Anaphylaxis   Penicillins Anaphylaxis   Ambien [Zolpidem Tartrate] Other (See Comments)    Sleep walking    Asa [Aspirin] Other (See Comments)    Rectal  bleeding    Banana Itching, Swelling and Other (See Comments)    Tongue itches and swells- breathing not impaired   Ivp Dye [Iodinated Contrast Media] Nausea And Vomiting and Other (See Comments)    Severe vomiting    Lisinopril Cough   Metformin And Related Nausea Only and Other (See Comments)    Severe GI upset   Nsaids Other (See Comments)    Rectal bleeding    Pineapple Other (See Comments)    Causes a burning sensation in the mouth   Tape Other (See Comments)    Paper tape "burns the skin"   Codeine Rash   Flagyl [Metronidazole] Rash   Morphine And Related Rash    History of Present Illness    51 year old female with the above past medical history including CAD s/p NSTEMI, DES-o-pLAD in 03/2021, 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   She was hospitalized in April 2022 for chest pain. Troponin was mildly elevated. Echocardiogram at the time showed an EF of 60-65%, G2 DD, no R WMA, elevated LA pressure, mild LAE, and no significant valvular abnormalities. Her chest pain was felt to be atypical at the time. Unfortunately, she presented to the ED again in May 2022 with recurrent chest pain in the setting of NSTEMI.  She underwent cardiac catheterization which showed o-pLAD 95-0%  s/p DES, and pRCA 50%, oPDA 60%, managed medically, mildly reduced follow-up she has 45 to 50%, apical anterior hypokinesis. At her follow-up visit in August 2022 she reported ongoing shortness of breath, Brilinta was switched to Plavix. Her volume status was stable at the time. She was last seen in the office on November 01, 2021 following an episode of atypical chest discomfort located on the right side of her chest underneath her right breast.  Her pain was thought to be noncardiac related; she denied any exertional symptoms, EKG was unremarkable. She was still smoking at the time and Chantix was prescribed.  She presents today  for follow-up.  Since her last visit she has done well from a cardiac standpoint. She states that time of her last visit she was feeling poorly as she was coming down with COVID.  She has recovered well. She denies any symptoms concerning for angina.  Overall, she reports feeling well denies any specific concerns or complaints today.  Home Medications    Current Outpatient Medications  Medication Sig Dispense Refill   aspirin 81 MG EC tablet Take 1 tablet (81 mg total) by mouth daily. Swallow whole. 30 tablet 11   carvedilol (COREG) 6.25 MG tablet Take 1 tablet (6.25 mg total) by mouth 2 (two) times daily with a meal. 60 tablet 6   clopidogrel (PLAVIX) 75 MG tablet On 8/13 take 4 tablets then starting 8/14, can take 1 tablet by mouth daily 90 tablet 3   Continuous Blood Gluc Sensor (DEXCOM G6 SENSOR) MISC Change every 10 days 9 each 3   Continuous Blood Gluc Transmit (DEXCOM G6 TRANSMITTER) MISC Change every 90 days 1 each 3   insulin glargine (LANTUS SOLOSTAR) 100 UNIT/ML Solostar Pen Inject 20 Units into the skin daily. 15 mL 6   insulin lispro (HUMALOG KWIKPEN) 100 UNIT/ML KwikPen Max daily 30 units per correction scale 15 mL 6   Insulin Pen Needle 32G X 4 MM MISC Use as directed with Humalog up to 4 times a day; in the morning, at noon, in the evening, and at bedtime. 400 each 2   nitroGLYCERIN (NITROSTAT) 0.4 MG SL tablet Place 1 tablet (0.4 mg total) under the tongue every 5 (five) minutes as needed for chest pain (up to 3 doses). 25 tablet 3   rosuvastatin (CRESTOR) 20 MG tablet Take 1 tablet (20 mg total) by mouth daily. 90 tablet 3   Semaglutide,0.25 or 0.5MG /DOS, (OZEMPIC, 0.25 OR 0.5 MG/DOSE,) 2 MG/1.5ML SOPN Inject 0.5 mg into the skin once a week. 1.5 mL 3   tiZANidine (ZANAFLEX) 2 MG tablet Take 0.5-2 tablets (1-4 mg total) by mouth 2 (two) times daily as needed for muscle spasms. 40 tablet 1   valACYclovir (VALTREX) 500 MG tablet Take 1 tablet by mouth twice daily 180 tablet 1    amLODipine (NORVASC) 10 MG tablet Take 1 tablet (10 mg total) by mouth daily. 90 tablet 1   varenicline (CHANTIX CONTINUING MONTH PAK) 1 MG tablet Take 1 tablet (1 mg total) by mouth 2 (two) times daily. 60 tablet 2   No current facility-administered medications for this visit.     Review of Systems   She denies chest pain, palpitations, dyspnea, pnd, orthopnea, n, v, dizziness, syncope, edema, weight gain, or early satiety. All other systems reviewed and are otherwise negative except as noted above.   Physical Exam    VS:  BP 132/78    Pulse 84    Ht 5\' 7"  (1.702 m)  Wt 213 lb 12.8 oz (97 kg)    SpO2 97%    BMI 33.49 kg/m   GEN: Well nourished, well developed, in no acute distress. HEENT: normal. Neck: Supple, no JVD, carotid bruits, or masses. Cardiac: RRR, no murmurs, rubs, or gallops. No clubbing, cyanosis, edema.  Radials/DP/PT 2+ and equal bilaterally.  Respiratory:  Respirations regular and unlabored, clear to auscultation bilaterally. GI: Soft, nontender, nondistended, BS + x 4. MS: no deformity or atrophy. Skin: warm and dry, no rash. Neuro:  Strength and sensation are intact. Psych: Normal affect.  Accessory Clinical Findings    ECG personally reviewed by me today - No EKG in office day.  Lab Results  Component Value Date   WBC 10.7 (H) 12/31/2021   HGB 13.4 12/31/2021   HCT 41.1 12/31/2021   MCV 95.7 12/31/2021   PLT 393.0 12/31/2021   Lab Results  Component Value Date   CREATININE 0.90 12/31/2021   BUN 15 12/31/2021   NA 141 12/31/2021   K 3.9 12/31/2021   CL 102 12/31/2021   CO2 32 12/31/2021   Lab Results  Component Value Date   ALT 11 12/31/2021   AST 11 12/31/2021   ALKPHOS 78 12/31/2021   BILITOT 0.5 12/31/2021   Lab Results  Component Value Date   CHOL 129 12/31/2021   HDL 36.90 (L) 12/31/2021   LDLCALC 67 12/31/2021   TRIG 128.0 12/31/2021   CHOLHDL 4 12/31/2021    Lab Results  Component Value Date   HGBA1C 8.5 (A) 11/05/2021     Assessment & Plan    1. CAD: S/p NSTEMI, DES-o-pLAD in 03/2021, EF 45-50%. Residual disease (pRCA 50%, oPDA 60%), managed medically. Prior echo in 03/2021 showed an EF of 60-65%, G2 DD, no R WMA, elevated LA pressure, mild LAE, and no significant valvular abnormalities. Stable with no anginal symptoms. No indication for ischemic evaluation at this time. Continue aspirin, Plavix, amlodipine, carvedilol, and Crestor.  2. Chronic combined systolic and diastolic heart failure: Prior echo as above.  Cath in May 2020 showed EF 45 to 50%. Euvolemic and well compensated on exam.  Continue current medications as above.  3. Hypertension: BP well controlled. Continue current antihypertensive regimen.    4. Hyperlipidemia: LDL was 67 in January 2023.  Continue Crestor.  5. Tobacco use: She has had great success with Chantix. She is rarely smoking.  She is motivated to quit.  Full cessation advised.  6. Disposition: Follow-up in 4 months (has appointment scheduled with Dr. Debara Pickett in June 2023).  Lenna Sciara, NP 01/03/2022, 2:20 PM

## 2022-01-03 NOTE — Patient Instructions (Addendum)
Medication Instructions:  No Changes *If you need a refill on your cardiac medications before your next appointment, please call your pharmacy*   Lab Work: No Labs If you have labs (blood work) drawn today and your tests are completely normal, you will receive your results only by: MyChart Message (if you have MyChart) OR A paper copy in the mail If you have any lab test that is abnormal or we need to change your treatment, we will call you to review the results.   Testing/Procedures: No Testing   Follow-Up: At [facility], you and your health needs are our priority.  As part of our continuing mission to provide you with exceptional heart care, we have created designated Provider Care Teams.  These Care Teams include your primary Cardiologist (physician) and Advanced Practice Providers (APPs -  Physician Assistants and Nurse Practitioners) who all work together to provide you with the care you need, when you need it.  We recommend signing up for the patient portal called "MyChart".  Sign up information is provided on this After Visit Summary.  MyChart is used to connect with patients for Virtual Visits (Telemedicine).  Patients are able to view lab/test results, encounter notes, upcoming appointments, etc.  Non-urgent messages can be sent to your provider as well.   To learn more about what you can do with MyChart, go to [email or web address].    Your next appointment:   May 26, 2022 3:15 PM  The format for your next appointment:   In Person  Provider:    Chrystie Nose, MD

## 2022-01-27 ENCOUNTER — Encounter: Payer: Self-pay | Admitting: Gastroenterology

## 2022-01-31 ENCOUNTER — Other Ambulatory Visit (HOSPITAL_COMMUNITY): Payer: Self-pay

## 2022-02-04 DIAGNOSIS — M47816 Spondylosis without myelopathy or radiculopathy, lumbar region: Secondary | ICD-10-CM | POA: Diagnosis not present

## 2022-02-17 ENCOUNTER — Other Ambulatory Visit (HOSPITAL_COMMUNITY): Payer: Self-pay

## 2022-02-17 ENCOUNTER — Encounter: Payer: Self-pay | Admitting: Gastroenterology

## 2022-02-17 ENCOUNTER — Other Ambulatory Visit: Payer: BC Managed Care – PPO

## 2022-02-17 ENCOUNTER — Telehealth: Payer: Self-pay

## 2022-02-17 ENCOUNTER — Ambulatory Visit (INDEPENDENT_AMBULATORY_CARE_PROVIDER_SITE_OTHER): Payer: BC Managed Care – PPO | Admitting: Gastroenterology

## 2022-02-17 VITALS — BP 118/88 | HR 70 | Ht 67.0 in | Wt 220.0 lb

## 2022-02-17 DIAGNOSIS — Z1211 Encounter for screening for malignant neoplasm of colon: Secondary | ICD-10-CM | POA: Diagnosis not present

## 2022-02-17 DIAGNOSIS — Z1212 Encounter for screening for malignant neoplasm of rectum: Secondary | ICD-10-CM | POA: Diagnosis not present

## 2022-02-17 DIAGNOSIS — Z7902 Long term (current) use of antithrombotics/antiplatelets: Secondary | ICD-10-CM

## 2022-02-17 DIAGNOSIS — K589 Irritable bowel syndrome without diarrhea: Secondary | ICD-10-CM | POA: Diagnosis not present

## 2022-02-17 DIAGNOSIS — R140 Abdominal distension (gaseous): Secondary | ICD-10-CM | POA: Diagnosis not present

## 2022-02-17 MED ORDER — LINACLOTIDE 145 MCG PO CAPS
145.0000 ug | ORAL_CAPSULE | Freq: Every day | ORAL | 3 refills | Status: DC
  Filled 2022-02-17 – 2022-06-21 (×3): qty 90, 90d supply, fill #0
  Filled 2022-09-17: qty 90, 90d supply, fill #1

## 2022-02-17 MED ORDER — SUTAB 1479-225-188 MG PO TABS
1.0000 | ORAL_TABLET | Freq: Once | ORAL | 0 refills | Status: AC
  Filled 2022-02-17 – 2022-03-03 (×2): qty 24, 1d supply, fill #0

## 2022-02-17 NOTE — Progress Notes (Signed)
? ?HPI : Bonnie Dickson is a very pleasant 51 year old female with a history of coronary artery disease with NSTEMI in May 2022 treated with drug-eluting stent to the LAD who is referred to [place] by Dr. Danise Edge for colon cancer screening.  She is on dual antiplatelet therapy.  Patient states that she has had problems with her GI tract for many years.  She is bothered by symptoms of excessive bloating, abdominal distention, flatulence and belching.  She states that her belches consistently have an egg like taste/smell even though she has stopped eating eggs.  She has chronic constipation, usually with a bowel movement once a week.  Some weeks she will have a bowel movement every day, but most the time she is limited to about 1 bowel movement per week.  This is been her pattern for over 10 years.  She has crampy abdominal pain on occasion.  She has not identified any particular foods that tend to worsen any of her symptoms. ?For her constipation, she tried MiraLAX on a daily basis which did improve her bowel movement frequency, but caused her stools to be loose and caused her hemorrhoids to swell up.  She took Linzess in the past as well which helped with her pain and constipation, but did cause some urgency and prompted her to stop taking it.  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  Currently, she works at a desk job and urgency would not be as big of an issue. ?She reports having both an upper and lower endoscopy in [place], [place].  The colonoscopy was at least 10 years ago and she thinks the upper endoscopy was about 7 or 8 years ago.  These procedures were done to evaluate chronic GI symptoms.  She believes that she had polyps removed but does not recall if they were in the stomach or in the colon. ?She has gained 15lbs in the past 64months. ?No known family history of GI malignancy. ? ? ? ?Past Medical History:  ?Diagnosis Date  ? CAD  (coronary artery disease)   ? a. NSTEMI 03/2021 s/p DES to LAD.  ? Depression   ? situational  ? Diabetes mellitus without complication (HCC)   ? Endometriosis   ? History of chicken pox   ? HSV-2 (herpes simplex virus 2) infection   ? Hyperglycemia 07/24/2019  ? Hyperlipidemia   ? Hypertension   ? Insomnia   ? Snoring   ? Tonsillitis and adenoiditis, chronic   ? childhood, caused adult snoring  ? ? ? ?Past Surgical History:  ?Procedure Laterality Date  ? ABDOMINAL HYSTERECTOMY    ? ovaries left in place  ? BREAST CYST EXCISION Right   ? 35 years ago   ? COMBINED ABDOMINOPLASTY AND LIPOSUCTION    ? CORONARY STENT INTERVENTION N/A 04/01/2021  ? Procedure: CORONARY STENT INTERVENTION;  Surgeon: Marykay Lex, MD;  Location: [facility] INVASIVE CV LAB;  Service: Cardiovascular;  Laterality: N/A;  ? ENDOMETRIAL ABLATION    ? ENDOMETRIAL BIOPSY    ? LAPAROSCOPIC ENDOMETRIOSIS FULGURATION    ? LEFT HEART CATH AND CORONARY ANGIOGRAPHY N/A 04/01/2021  ? Procedure: LEFT HEART CATH AND CORONARY ANGIOGRAPHY;  Surgeon: Marykay Lex, MD;  Location: [facility] INVASIVE CV LAB;  Service: Cardiovascular;  Laterality: N/A;  ? PARTIAL HYSTERECTOMY    ? vaginal  ? TUBAL LIGATION    ? ?Family History  ?Problem Relation Age of Onset  ?  Hypertension Mother   ? Diabetes Mother   ?     prediabetes  ? Hyperlipidemia Mother   ? Diabetes Father   ? Cataracts Father   ? Eczema Brother   ? Asthma Brother   ? Diabetes Maternal Grandmother   ? Hypertension Maternal Grandmother   ? Diabetes Maternal Grandfather   ? Hypertension Maternal Grandfather   ? Hyperlipidemia Maternal Grandfather   ? Heart failure Maternal Grandfather   ? COPD Maternal Grandfather   ? ODD Son   ? ADD / ADHD Son   ? Diabetes Daughter   ? Hypertension Daughter   ? Colon cancer Neg Hx   ? Stomach cancer Neg Hx   ? Pancreatic cancer Neg Hx   ? Esophageal cancer Neg Hx   ? ?Social History  ? ?Tobacco Use  ? Smoking status: Former  ?  Packs/day: 0.50  ?  Years: 30.00  ?  Pack years: 15.00  ?   Types: Cigarettes  ? Smokeless tobacco: Never  ?Vaping Use  ? Vaping Use: Never used  ?Substance Use Topics  ? Alcohol use: Never  ? Drug use: Never  ? ?Current Outpatient Medications  ?Medication Sig Dispense Refill  ? amLODipine (NORVASC) 10 MG tablet Take 1 tablet (10 mg total) by mouth daily. 90 tablet 1  ? aspirin 81 MG EC tablet Take 1 tablet (81 mg total) by mouth daily. Swallow whole. 30 tablet 11  ? carvedilol (COREG) 6.25 MG tablet Take 1 tablet (6.25 mg total) by mouth 2 (two) times daily with a meal. 60 tablet 6  ? clopidogrel (PLAVIX) 75 MG tablet On 8/13 take 4 tablets then starting 8/14, can take 1 tablet by mouth daily 90 tablet 3  ? Continuous Blood Gluc Sensor (DEXCOM G6 SENSOR) MISC Change every 10 days 9 each 3  ? Continuous Blood Gluc Transmit (DEXCOM G6 TRANSMITTER) MISC Change every 90 days 1 each 3  ? insulin glargine (LANTUS SOLOSTAR) 100 UNIT/ML Solostar Pen Inject 20 Units into the skin daily. 15 mL 6  ? insulin lispro (HUMALOG KWIKPEN) 100 UNIT/ML KwikPen Max daily 30 units per correction scale 15 mL 6  ? Insulin Pen Needle 32G X 4 MM MISC Use as directed with Humalog up to 4 times a day; in the morning, at noon, in the evening, and at bedtime. 400 each 2  ? nitroGLYCERIN (NITROSTAT) 0.4 MG SL tablet Place 1 tablet (0.4 mg total) under the tongue every 5 (five) minutes as needed for chest pain (up to 3 doses). 25 tablet 3  ? rosuvastatin (CRESTOR) 20 MG tablet Take 1 tablet (20 mg total) by mouth daily. 90 tablet 3  ? Semaglutide,0.25 or 0.5MG /DOS, (OZEMPIC, 0.25 OR 0.5 MG/DOSE,) 2 MG/1.5ML SOPN Inject 0.5 mg into the skin once a week. 1.5 mL 3  ? tiZANidine (ZANAFLEX) 2 MG tablet Take 0.5-2 tablets (1-4 mg total) by mouth 2 (two) times daily as needed for muscle spasms. 40 tablet 1  ? valACYclovir (VALTREX) 500 MG tablet Take 1 tablet by mouth twice daily 180 tablet 1  ? varenicline (CHANTIX CONTINUING MONTH PAK) 1 MG tablet Take 1 tablet (1 mg total) by mouth 2 (two) times daily. 60  tablet 2  ? ?No current facility-administered medications for this visit.  ? ?Allergies  ?Allergen Reactions  ? Ciprofloxacin Hcl Anaphylaxis  ? Gentamicin Anaphylaxis  ? Penicillins Anaphylaxis  ? Ambien [Zolpidem Tartrate] Other (See Comments)  ?  Sleep walking ?  ? Asa [Aspirin] Other (See Comments)  ?  Rectal bleeding ?  ? Banana Itching, Swelling and Other (See Comments)  ?  Tongue itches and swells- breathing not impaired  ? Ivp Dye [Iodinated Contrast Media] Nausea And Vomiting and Other (See Comments)  ?  Severe vomiting ?  ? Lisinopril Cough  ? Metformin And Related Nausea Only and Other (See Comments)  ?  Severe GI upset  ? Nsaids Other (See Comments)  ?  Rectal bleeding ?  ? Pineapple Other (See Comments)  ?  Causes a burning sensation in the mouth  ? Tape Other (See Comments)  ?  Paper tape "burns the skin"  ? Codeine Rash  ? Flagyl [Metronidazole] Rash  ? Morphine And Related Rash  ? ? ? ?Review of Systems: ?All systems reviewed and negative except where noted in HPI.  ? ? ?No results found. ? ?Physical Exam: ?Ht 5\' 7"  (1.702 m)   Wt 220 lb (99.8 kg)   BMI 34.46 kg/m?  ?Constitutional: Pleasant,well-developed, African-American female in no acute distress. ?HEENT: Normocephalic and atraumatic. Conjunctivae are normal. No scleral icterus. ?Neck supple.  ?Cardiovascular: Normal rate, regular rhythm.  ?Pulmonary/chest: Effort normal and breath sounds normal. No wheezing, rales or rhonchi. ?Abdominal: Soft, nondistended, mild multifocal tenderness to palpation in the lower abdomen, without rigidity or guarding. Bowel sounds active throughout. There are no masses palpable. No hepatomegaly. ?Extremities: no edema ?Neurological: Alert and oriented to person place and time. ?Skin: Skin is warm and dry. No rashes noted. ?Psychiatric: Normal mood and affect. Behavior is normal. ? ?CBC ?   ?Component Value Date/Time  ? WBC 10.7 (H) 12/31/2021 1536  ? RBC 4.29 12/31/2021 1536  ? HGB 13.4 12/31/2021 1536  ? HCT  41.1 12/31/2021 1536  ? PLT 393.0 12/31/2021 1536  ? MCV 95.7 12/31/2021 1536  ? MCH 31.5 04/02/2021 0222  ? MCHC 32.7 12/31/2021 1536  ? RDW 14.1 12/31/2021 1536  ? LYMPHSABS 2.6 05/23/2021 1119  ? MONOABS 0.6

## 2022-02-17 NOTE — Patient Instructions (Signed)
If you are age 50 or older, your body mass index should be between 23-30. Your Body mass index is 34.46 kg/m?Marland Kitchen If this is out of the aforementioned range listed, please consider follow up with your Primary Care Provider. ? ?If you are age 12 or younger, your body mass index should be between 19-25. Your Body mass index is 34.46 kg/m?Marland Kitchen If this is out of the aformentioned range listed, please consider follow up with your Primary Care Provider.  ? ?You have been scheduled for a colonoscopy. Please follow written instructions given to you at your visit today.  ?Please pick up your prep supplies at the pharmacy within the next 1-3 days. ?If you use inhalers (even only as needed), please bring them with you on the day of your procedure.  ? ?Your provider has requested that you go to the basement level for lab work before leaving today. Press "B" on the elevator. The lab is located at the first door on the left as you exit the elevator. ? ?We have sent the following medications to your pharmacy for you to pick up at your convenience: Linzess 145 mcg ? ?The [facility] providers would like to encourage you to use [facility] to communicate with providers for non-urgent requests or questions.  Due to long hold times on the telephone, sending your provider a message by [facility] may be a faster and more efficient way to get a response.  Please allow 48 business hours for a response.  Please remember that this is for non-urgent requests.  ? ?Due to recent changes in healthcare laws, you may see the results of your imaging and laboratory studies on MyChart before your provider has had a chance to review them.  We understand that in some cases there may be results that are confusing or concerning to you. Not all laboratory results come back in the same time frame and the provider may be waiting for multiple results in order to interpret others.  Please give [place] 48 hours in order for your provider to thoroughly review all the results  before contacting the office for clarification of your results.   ? ?It was a pleasure to see you today! ? ?Thank you for trusting me with your gastrointestinal care!   ? ?Scott E.Cunningham,MD ? ?

## 2022-02-17 NOTE — Telephone Encounter (Signed)
[facility] Pre-operative Risk Assessment  ?   ?Request for surgical clearance:     Endoscopy Procedure ? ?What type of surgery is being performed?     Colonscopy ? ?When is this surgery scheduled?     03/05/22 ? ?What type of clearance is required ?   Pharmacy ? ?Are there any medications that need to be held prior to surgery and how long? Plavix 5 days. ? ?Practice name and name of physician performing surgery?      [facility] ? ?What is your office phone and fax number?      Phone- [phone]  Fax- [phone] ? ?Anesthesia type (None, local, MAC, general) ?       MAC  ?

## 2022-02-17 NOTE — Telephone Encounter (Signed)
Dr. Rennis Golden ?Pt had PCI with DES to ostial-proximal LAD 03/2021. ? ?We are asked to hold plavix 5 days prior to a colonoscopy scheduled 03/05/22.  ? ?Do you prefer she postpone colonoscopy since she will be within 12 months of PCI? Please advise on when she may hold plavix.  ?

## 2022-02-18 DIAGNOSIS — M47816 Spondylosis without myelopathy or radiculopathy, lumbar region: Secondary | ICD-10-CM | POA: Diagnosis not present

## 2022-02-18 LAB — TISSUE TRANSGLUTAMINASE, IGA: (tTG) Ab, IgA: <1 U/mL

## 2022-02-18 LAB — IGA: Immunoglobulin A: 197 mg/dL (ref 47–310)

## 2022-02-18 NOTE — Telephone Encounter (Signed)
Ok to hold Plavix 5 days prior to procedure - can restart after. ? ?Dr Rexene Edison ?

## 2022-02-18 NOTE — Telephone Encounter (Signed)
? ?  Name: Bonnie Dickson  ?DOB: November 15, 1971  ?MRN: [id number]  ? ?Primary Cardiologist: Pixie Casino, MD ? ?Chart reviewed as part of pre-operative protocol coverage.  ? ?We have been asked for guidance to hold Plavix.  I reached out to Dr. Debara Pickett given that he is within 12 months from PCI. ? ?Per Dr. Debara Pickett: Faythe Ghee to hold Plavix 5 days prior to procedure-can restart after. ? ?I will route this recommendation to the requesting party via Epic fax function and remove from pre-op pool. Please call with questions. ? ?Ledora Bottcher, PA ?02/18/2022, 5:17 PM ? ?

## 2022-02-19 ENCOUNTER — Other Ambulatory Visit: Payer: BC Managed Care – PPO

## 2022-02-19 DIAGNOSIS — Z1211 Encounter for screening for malignant neoplasm of colon: Secondary | ICD-10-CM

## 2022-02-19 DIAGNOSIS — Z1212 Encounter for screening for malignant neoplasm of rectum: Secondary | ICD-10-CM | POA: Diagnosis not present

## 2022-02-20 NOTE — Telephone Encounter (Signed)
Called patient and let her know to hold Plavix 5 days prior to her procedure. Patient stated understanding and had no questions at the end of call. ?

## 2022-02-20 NOTE — Progress Notes (Signed)
Bonnie Dickson,  ?Your tests for celiac disease were negative.  Await results for H. Pylori stool test ?

## 2022-02-21 LAB — H. PYLORI ANTIGEN, STOOL: H pylori Ag, Stl: NEGATIVE

## 2022-02-24 ENCOUNTER — Encounter: Payer: Self-pay | Admitting: Internal Medicine

## 2022-02-24 NOTE — Progress Notes (Signed)
Ms. Altizer,  ?Your H. Pylori stool test was negative.  Please continue to take the Linzess and follow up with me as needed in 6-8 weeks if your bloating/constipation is not getting better.

## 2022-02-25 ENCOUNTER — Other Ambulatory Visit (HOSPITAL_COMMUNITY): Payer: Self-pay

## 2022-02-27 ENCOUNTER — Encounter: Payer: Self-pay | Admitting: Gastroenterology

## 2022-03-03 ENCOUNTER — Other Ambulatory Visit: Payer: Self-pay | Admitting: Internal Medicine

## 2022-03-03 ENCOUNTER — Other Ambulatory Visit (HOSPITAL_COMMUNITY): Payer: Self-pay

## 2022-03-03 MED ORDER — OZEMPIC (0.25 OR 0.5 MG/DOSE) 2 MG/1.5ML ~~LOC~~ SOPN
0.5000 mg | PEN_INJECTOR | SUBCUTANEOUS | 3 refills | Status: DC
  Filled 2022-03-03: qty 1.5, 28d supply, fill #0

## 2022-03-05 ENCOUNTER — Ambulatory Visit (AMBULATORY_SURGERY_CENTER): Payer: BC Managed Care – PPO | Admitting: Gastroenterology

## 2022-03-05 ENCOUNTER — Encounter: Payer: Self-pay | Admitting: Gastroenterology

## 2022-03-05 VITALS — BP 137/88 | HR 63 | Temp 98.0°F | Resp 22 | Ht 67.0 in | Wt 220.0 lb

## 2022-03-05 DIAGNOSIS — Z1211 Encounter for screening for malignant neoplasm of colon: Secondary | ICD-10-CM | POA: Diagnosis not present

## 2022-03-05 DIAGNOSIS — Z1212 Encounter for screening for malignant neoplasm of rectum: Secondary | ICD-10-CM

## 2022-03-05 MED ORDER — SODIUM CHLORIDE 0.9 % IV SOLN: 500.0000 mL | Freq: Once | INTRAVENOUS | Status: DC

## 2022-03-05 NOTE — Op Note (Signed)
[place] ?Patient Name: Bonnie Dickson ?Procedure Date: 03/05/2022 7:22 AM ?MRN: [id number] ?Endoscopist: Yanni Ruberg E. Candis Schatz , MD ?Age: 51 ?Referring MD:  ?Date of Birth: 1971-02-14 ?Gender: Female ?Account #: [id number] ?Procedure:                Colonoscopy ?Indications:              Screening for colorectal malignant neoplasm ?Medicines:                Monitored Anesthesia Care ?Procedure:                Pre-Anesthesia Assessment: ?                          - Prior to the procedure, a History and Physical  ?                          was performed, and patient medications and  ?                          allergies were reviewed. The patient's tolerance of  ?                          previous anesthesia was also reviewed. The risks  ?                          and benefits of the procedure and the sedation  ?                          options and risks were discussed with the patient.  ?                          All questions were answered, and informed consent  ?                          was obtained. Prior Anticoagulants: The patient has  ?                          taken Plavix (clopidogrel), last dose was 5 days  ?                          prior to procedure. ASA Grade Assessment: II - A  ?                          patient with mild systemic disease. After reviewing  ?                          the risks and benefits, the patient was deemed in  ?                          satisfactory condition to undergo the procedure. ?                          After obtaining informed consent, the colonoscope  ?  was passed under direct vision. Throughout the  ?                          procedure, the patient's blood pressure, pulse, and  ?                          oxygen saturations were monitored continuously. The  ?                          Olympus [id number]) Colonoscope was  ?                          introduced through the anus and advanced to the the  ?                           cecum, identified by appendiceal orifice and  ?                          ileocecal valve. The colonoscopy was somewhat  ?                          difficult due to significant looping. Successful  ?                          completion of the procedure was aided by using  ?                          manual pressure. The patient tolerated the  ?                          procedure well. The quality of the bowel  ?                          preparation was adequate. The ileocecal valve,  ?                          appendiceal orifice, and rectum were photographed.  ?                          The bowel preparation used was Sutab via split dose  ?                          instruction. ?Scope In: [id number] AM ?Scope Out: 8:17:31 AM ?Scope Withdrawal Time: 0 hours 10 minutes 46 seconds  ?Total Procedure Duration: 0 hours 20 minutes 32 seconds  ?Findings:                 Hemorrhoids were found on perianal exam. ?                          A diffuse area of mild melanosis was found in the  ?                          descending colon and in the transverse colon. ?  The exam was otherwise normal throughout the  ?                          examined colon. ?                          Non-bleeding internal hemorrhoids were found during  ?                          retroflexion. The hemorrhoids were large. ?                          No additional abnormalities were found on  ?                          retroflexion. ?Complications:            No immediate complications. ?Estimated Blood Loss:     Estimated blood loss: none. ?Impression:               - Hemorrhoids found on perianal exam. ?                          - Melanosis in the colon. ?                          - Non-bleeding internal hemorrhoids. ?                          - No specimens collected. ?Recommendation:           - Patient has a contact number available for  ?                          emergencies. The signs and symptoms of potential  ?                           delayed complications were discussed with the  ?                          patient. Return to normal activities tomorrow.  ?                          Written discharge instructions were provided to the  ?                          patient. ?                          - Resume previous diet. ?                          - Continue present medications. ?                          - Repeat colonoscopy in 10 years for screening  ?  purposes. ?Edithe Dobbin E. Candis Schatz, MD ?03/05/2022 8:22:44 AM ?This report has been signed electronically. ?

## 2022-03-05 NOTE — Progress Notes (Signed)
History and Physical Interval Note: ? ?03/05/2022 ?7:47 AM ? ?Bonnie Dickson  has presented today for endoscopic procedure(s), with the diagnosis of  ?Encounter Diagnosis  ?Name Primary?  ? Screening for colorectal cancer Yes  ?Marland Kitchen  The various methods of evaluation and treatment have been discussed with the patient and/or family. After consideration of risks, benefits and other options for treatment, the patient has consented to  the endoscopic procedure(s). ? ? The patient's history has been reviewed, patient examined, no change in status, stable for endoscopic procedure(s).  I have reviewed the patient's chart and labs.  Questions were answered to the patient's satisfaction.   ? ? ?[name] E. Tomasa Rand, MD ?[facility] ? ? ? ?

## 2022-03-05 NOTE — Patient Instructions (Signed)
HANDOUTS PROVIDED ON: HEMORRHOIDS ? ?Your next colonoscopy should occur in 10 years.   ? ?You may resume your previous diet and medication schedule.  Resume your Plavix today as well. ? ?Thank you for allowing [place] to care for you today!!! ? ? ?YOU HAD AN ENDOSCOPIC PROCEDURE TODAY AT THE [facility]:   Refer to the procedure report that was given to you for any specific questions about what was found during the examination.  If the procedure report does not answer your questions, please call your gastroenterologist to clarify.  If you requested that your care partner not be given the details of your procedure findings, then the procedure report has been included in a sealed envelope for you to review at your convenience later. ? ?YOU SHOULD EXPECT: Some feelings of bloating in the abdomen. Passage of more gas than usual.  Walking can help get rid of the air that was put into your GI tract during the procedure and reduce the bloating. If you had a lower endoscopy (such as a colonoscopy or flexible sigmoidoscopy) you may notice spotting of blood in your stool or on the toilet paper. If you underwent a bowel prep for your procedure, you may not have a normal bowel movement for a few days. ? ?Please Note:  You might notice some irritation and congestion in your nose or some drainage.  This is from the oxygen used during your procedure.  There is no need for concern and it should clear up in a day or so. ? ?SYMPTOMS TO REPORT IMMEDIATELY: ? ?Following lower endoscopy (colonoscopy or flexible sigmoidoscopy): ? Excessive amounts of blood in the stool ? Significant tenderness or worsening of abdominal pains ? Swelling of the abdomen that is new, acute ? Fever of 100?F or higher ? ?For urgent or emergent issues, a gastroenterologist can be reached at any hour by calling [phone]. ?Do not use MyChart messaging for urgent concerns.  ? ? ?DIET:  We do recommend a small meal at first, but then you may proceed  to your regular diet.  Drink plenty of fluids but you should avoid alcoholic beverages for 24 hours. ? ?ACTIVITY:  You should plan to take it easy for the rest of today and you should NOT DRIVE or use heavy machinery until tomorrow (because of the sedation medicines used during the test).   ? ?FOLLOW UP: ?Our staff will call the number listed on your records Monday morning between 7:15 am and 8:15 am following your procedure to check on you and address any questions or concerns that you may have regarding the information given to you following your procedure. If we do not reach you, we will leave a message.  We will attempt to reach you two times.  During this call, we will ask if you have developed any symptoms of COVID 19. If you develop any symptoms (ie: fever, flu-like symptoms, shortness of breath, cough etc.) before then, please call [phone].  If you test positive for Covid 19 in the 2 weeks post procedure, please call and report this information to [place].   ? ?If any biopsies were taken you will be contacted by phone or by letter within the next 1-3 weeks.  Please call us at [phone] if you have not heard about the biopsies in 3 weeks.  ? ? ?SIGNATURES/CONFIDENTIALITY: ?You and/or your care partner have signed paperwork which will be entered into your electronic medical record.  These signatures attest to the fact that  that the information above on your After Visit Summary has been reviewed and is understood.  Full responsibility of the confidentiality of this discharge information lies with you and/or your care-partner. ? ?

## 2022-03-05 NOTE — Progress Notes (Signed)
Report to PACU, RN, vss, BBS= Clear.  

## 2022-03-10 ENCOUNTER — Telehealth: Payer: Self-pay | Admitting: *Deleted

## 2022-03-10 NOTE — Telephone Encounter (Signed)
?  Follow up Call- ? ? ?  03/05/2022  ?  7:17 AM  ?Call back number  ?Post procedure Call Back phone  # [phone]  ?  ? ?Patient questions: ? ?Do you have a fever, pain , or abdominal swelling? No. ?Pain Score  0 * ? ?Have you tolerated food without any problems? Yes.   ? ?Have you been able to return to your normal activities? Yes.   ? ?Do you have any questions about your discharge instructions: ?Diet   No. ?Medications  No. ?Follow up visit  No. ? ?Do you have questions or concerns about your Care? No. ? ?Actions: ?* If pain score is 4 or above: ?No action needed, pain <4. ? ? ?

## 2022-03-11 ENCOUNTER — Ambulatory Visit: Payer: BC Managed Care – PPO | Admitting: Internal Medicine

## 2022-03-11 NOTE — Progress Notes (Deleted)
?Name: Bonnie Dickson  ?MRN/ DOB: [id number], 03/11/1971   ?Age/ Sex: 51 y.o., female   ? ?PCP: Bradd Canary, MD   ?Reason for Endocrinology Evaluation: Type 2 Diabetes Mellitus  ?   ?Date of Initial Endocrinology Visit: 08/06/2021  ? ? ?PATIENT IDENTIFIER: [name] is a 51 y.o. female with a past medical history of HTN, T2DM, dyslipidemia, CAD . The patient presented for initial endocrinology clinic visit on 08/06/2021 for consultative assistance with her diabetes management.  ? ? ?DIABETIC HISTORY:  ?Bonnie Dickson  was diagnosed with DM in 2010. Metformin- GI upset. Was on Insulin in 2010-2011 and actos. Stopped all meds 01/2010 and was diet controlled until 10/2020 that she attributes to prednisone due to allergic reaction to Flagyl. Her hemoglobin A1c has ranged from 6.5% in 2020, peaking at 11.0% in 2021. ? ? ?On her initial visit with me she had an A1c of 9.3% she was on HUmalog SS only. We started Lantus and provided her with Correction scale  ? ?Ozempic started 10/2021 ? ? ?SUBJECTIVE:  ? ?During the last visit (11/05/2021): A1c 8.5 % Continue  Lantus and Humalog per CS, started Ozempic  ? ?Today (03/11/22): Bonnie Dickson is here for a follow up diabetes management.  She  checks her blood sugars multiple  times daily, through the CGM. The patient has not had hypoglycemic episodes since the last clinic visit ? ?She has not been able to dexcom download the app  ?Denies nausea, vomiting or diarrhea at  this time  ? ?HOME DIABETES REGIMEN: ?Humalog (BG-130/35)  ?Lantus 20 units daily  ?Ozempic 0.5 mg weekly  ? ? ? ? ?Statin: yes ?ACE-I/ARB: yes ?Prior Diabetic Education: no  ? ? ?METER DOWNLOAD SUMMARY: Did not bring  ? ? ? ? ? ?DIABETIC COMPLICATIONS: ?Microvascular complications:  ? ?Denies: CKD , neuropathy, retinopathy ?Last eye exam: pending  9/28th/2022 ? ?Macrovascular complications:  ?CAD ?Denies:  PVD, CVA ? ? ?PAST HISTORY: ?Past Medical History:  ?Past Medical History:  ?Diagnosis Date  ?  Arthritis   ? lower back  ? Blood transfusion without reported diagnosis   ? CAD (coronary artery disease)   ? a. NSTEMI 03/2021 s/p DES to LAD.  ? Depression   ? situational  ? Diabetes mellitus without complication (HCC)   ? Endometriosis   ? GERD (gastroesophageal reflux disease)   ? History of chicken pox   ? HSV-2 (herpes simplex virus 2) infection   ? Hyperglycemia 07/24/2019  ? Hyperlipidemia   ? Hypertension   ? Insomnia   ? Myocardial infarction (HCC) 03/31/2021  ? Snoring   ? Tonsillitis and adenoiditis, chronic   ? childhood, caused adult snoring  ? ?Past Surgical History:  ?Past Surgical History:  ?Procedure Laterality Date  ? ABDOMINAL HYSTERECTOMY    ? ovaries left in place  ? BREAST CYST EXCISION Right   ? 35 years ago   ? COMBINED ABDOMINOPLASTY AND LIPOSUCTION    ? CORONARY STENT INTERVENTION N/A 04/01/2021  ? Procedure: CORONARY STENT INTERVENTION;  Surgeon: Marykay Lex, MD;  Location: [facility] INVASIVE CV LAB;  Service: Cardiovascular;  Laterality: N/A;  ? ENDOMETRIAL ABLATION    ? ENDOMETRIAL BIOPSY    ? LAPAROSCOPIC ENDOMETRIOSIS FULGURATION    ? LEFT HEART CATH AND CORONARY ANGIOGRAPHY N/A 04/01/2021  ? Procedure: LEFT HEART CATH AND CORONARY ANGIOGRAPHY;  Surgeon: Marykay Lex, MD;  Location: [facility] INVASIVE CV LAB;  Service: Cardiovascular;  Laterality: N/A;  ? PARTIAL HYSTERECTOMY    ?  vaginal  ? TUBAL LIGATION    ?  ?Social History:  reports that she quit smoking about 4 months ago. Her smoking use included cigarettes. She has a 15.00 pack-year smoking history. She has never used smokeless tobacco. She reports that she does not drink alcohol and does not use drugs. ?Family History:  ?Family History  ?Problem Relation Age of Onset  ? Hypertension Mother   ? Diabetes Mother   ?     prediabetes  ? Hyperlipidemia Mother   ? Diabetes Father   ? Cataracts Father   ? Eczema Brother   ? Asthma Brother   ? Diabetes Maternal Grandmother   ? Hypertension Maternal Grandmother   ? Diabetes Maternal  Grandfather   ? Hypertension Maternal Grandfather   ? Hyperlipidemia Maternal Grandfather   ? Heart failure Maternal Grandfather   ? COPD Maternal Grandfather   ? ODD Son   ? ADD / ADHD Son   ? Diabetes Daughter   ? Hypertension Daughter   ? Colon cancer Neg Hx   ? Stomach cancer Neg Hx   ? Pancreatic cancer Neg Hx   ? Esophageal cancer Neg Hx   ? ? ? ?HOME MEDICATIONS: ?Allergies as of 03/11/2022   ? ?   Reactions  ? Ciprofloxacin Hcl Anaphylaxis  ? Gentamicin Anaphylaxis  ? Penicillins Anaphylaxis  ? Ambien [zolpidem Tartrate] Other (See Comments)  ? Sleep walking  ? Asa [aspirin] Other (See Comments)  ? Rectal bleeding  ? Banana Itching, Swelling, Other (See Comments)  ? Tongue itches and swells- breathing not impaired  ? Ivp Dye [iodinated Contrast Media] Nausea And Vomiting, Other (See Comments)  ? Severe vomiting  ? Lisinopril Cough  ? Metformin And Related Nausea Only, Other (See Comments)  ? Severe GI upset  ? Nsaids Other (See Comments)  ? Rectal bleeding  ? Pineapple Other (See Comments)  ? Causes a burning sensation in the mouth  ? Tape Other (See Comments)  ? Paper tape "burns the skin"  ? Codeine Rash  ? Flagyl [metronidazole] Rash  ? Morphine And Related Rash  ? ?  ? ?  ?Medication List  ?  ? ?  ? Accurate as of March 11, 2022  8:26 AM. If you have any questions, ask your nurse or doctor.  ?  ?  ? ?  ? ?amLODipine 10 MG tablet ?Commonly known as: NORVASC ?Take 1 tablet (10 mg total) by mouth daily. ?  ?Aspirin Low Dose 81 MG EC tablet ?Generic drug: aspirin ?Take 1 tablet (81 mg total) by mouth daily. Swallow whole. ?  ?BD Pen Needle Nano U/F 32G X 4 MM Misc ?Generic drug: Insulin Pen Needle ?Use as directed with Humalog up to 4 times a day; in the morning, at noon, in the evening, and at bedtime. ?  ?carvedilol 6.25 MG tablet ?Commonly known as: COREG ?Take 1 tablet (6.25 mg total) by mouth 2 (two) times daily with a meal. ?  ?clopidogrel 75 MG tablet ?Commonly known as: PLAVIX ?On 8/13 take 4 tablets  then starting 8/14, can take 1 tablet by mouth daily ?  ?insulin lispro 100 UNIT/ML KwikPen ?Commonly known as: HumaLOG KwikPen ?Max daily 30 units per correction scale ?  ?Lantus SoloStar 100 UNIT/ML Solostar Pen ?Generic drug: insulin glargine ?Inject 20 Units into the skin daily. ?  ?linaclotide 145 MCG Caps capsule ?Commonly known as: LINZESS ?Take 1 capsule (145 mcg total) by mouth daily before breakfast. ?  ?nitroGLYCERIN 0.4 MG SL tablet ?  Commonly known as: Nitrostat ?Place 1 tablet (0.4 mg total) under the tongue every 5 (five) minutes as needed for chest pain (up to 3 doses). ?  ?Ozempic (0.25 or 0.5 MG/DOSE) 2 MG/1.5ML Sopn ?Generic drug: Semaglutide(0.25 or 0.5MG /DOS) ?Inject 0.5 mg into the skin once a week. ?  ?rosuvastatin 20 MG tablet ?Commonly known as: CRESTOR ?Take 1 tablet (20 mg total) by mouth daily. ?  ?tiZANidine 2 MG tablet ?Commonly known as: ZANAFLEX ?Take 0.5-2 tablets (1-4 mg total) by mouth 2 (two) times daily as needed for muscle spasms. ?  ?valACYclovir 500 MG tablet ?Commonly known as: VALTREX ?Take 1 tablet by mouth twice daily ?  ?varenicline 1 MG tablet ?Commonly known as: Chantix Continuing Month Pak ?Take 1 tablet (1 mg total) by mouth 2 (two) times daily. ?  ? ?  ? ? ? ?ALLERGIES: ?Allergies  ?Allergen Reactions  ? Ciprofloxacin Hcl Anaphylaxis  ? Gentamicin Anaphylaxis  ? Penicillins Anaphylaxis  ? Ambien [Zolpidem Tartrate] Other (See Comments)  ?  Sleep walking ?  ? Asa [Aspirin] Other (See Comments)  ?  Rectal bleeding ?  ? Banana Itching, Swelling and Other (See Comments)  ?  Tongue itches and swells- breathing not impaired  ? Ivp Dye [Iodinated Contrast Media] Nausea And Vomiting and Other (See Comments)  ?  Severe vomiting ?  ? Lisinopril Cough  ? Metformin And Related Nausea Only and Other (See Comments)  ?  Severe GI upset  ? Nsaids Other (See Comments)  ?  Rectal bleeding ?  ? Pineapple Other (See Comments)  ?  Causes a burning sensation in the mouth  ? Tape Other (See  Comments)  ?  Paper tape "burns the skin"  ? Codeine Rash  ? Flagyl [Metronidazole] Rash  ? Morphine And Related Rash  ? ? ? ?REVIEW OF SYSTEMS: ?A comprehensive ROS was conducted with the patient and is negative ex

## 2022-03-31 ENCOUNTER — Other Ambulatory Visit: Payer: Self-pay | Admitting: Family Medicine

## 2022-03-31 ENCOUNTER — Other Ambulatory Visit (HOSPITAL_COMMUNITY): Payer: Self-pay

## 2022-03-31 ENCOUNTER — Other Ambulatory Visit: Payer: Self-pay | Admitting: Adult Health

## 2022-03-31 MED ORDER — OZEMPIC (0.25 OR 0.5 MG/DOSE) 2 MG/3ML ~~LOC~~ SOPN
0.5000 mg | PEN_INJECTOR | SUBCUTANEOUS | 2 refills | Status: DC
  Filled 2022-03-31: qty 3, 28d supply, fill #0

## 2022-04-01 ENCOUNTER — Other Ambulatory Visit (HOSPITAL_COMMUNITY): Payer: Self-pay

## 2022-04-01 MED ORDER — ASPIRIN 81 MG PO TBEC
81.0000 mg | DELAYED_RELEASE_TABLET | Freq: Every day | ORAL | 11 refills | Status: DC
  Filled 2022-04-01: qty 30, 30d supply, fill #0
  Filled 2022-05-08: qty 30, 30d supply, fill #1
  Filled 2022-06-01: qty 30, 30d supply, fill #2
  Filled 2022-07-03: qty 30, 30d supply, fill #3
  Filled 2022-08-07: qty 30, 30d supply, fill #4
  Filled 2022-09-03: qty 30, 30d supply, fill #5
  Filled 2022-10-03: qty 30, 30d supply, fill #6
  Filled 2022-11-04: qty 30, 30d supply, fill #7
  Filled 2022-12-09: qty 30, 30d supply, fill #8
  Filled 2023-01-11: qty 30, 30d supply, fill #9
  Filled 2023-02-17: qty 30, 30d supply, fill #10
  Filled 2023-03-15: qty 30, 30d supply, fill #11

## 2022-04-07 ENCOUNTER — Encounter: Payer: Self-pay | Admitting: Family Medicine

## 2022-04-07 ENCOUNTER — Encounter: Payer: Self-pay | Admitting: Internal Medicine

## 2022-04-14 ENCOUNTER — Other Ambulatory Visit (HOSPITAL_COMMUNITY): Payer: Self-pay

## 2022-04-14 ENCOUNTER — Other Ambulatory Visit: Payer: Self-pay | Admitting: Adult Health

## 2022-04-18 ENCOUNTER — Telehealth: Payer: Self-pay

## 2022-04-18 MED ORDER — VARENICLINE TARTRATE 1 MG PO TABS: 1.0000 mg | ORAL_TABLET | Freq: Two times a day (BID) | ORAL | 2 refills | Status: DC

## 2022-04-18 NOTE — Telephone Encounter (Signed)
Ok to refill for an additional 3 months. After that, recommend follow-up with PCP for ongoing tobacco cessation management. Thank you.     Joylene Grapes, NP 2 hours ago (10:22 AM)      I will send patient this message through MyChart.

## 2022-04-29 ENCOUNTER — Ambulatory Visit (INDEPENDENT_AMBULATORY_CARE_PROVIDER_SITE_OTHER): Payer: BC Managed Care – PPO | Admitting: Family Medicine

## 2022-04-29 ENCOUNTER — Encounter: Payer: Self-pay | Admitting: Family Medicine

## 2022-04-29 ENCOUNTER — Other Ambulatory Visit (HOSPITAL_COMMUNITY): Payer: Self-pay

## 2022-04-29 VITALS — BP 118/80 | HR 78 | Resp 20 | Ht 67.0 in | Wt 219.0 lb

## 2022-04-29 DIAGNOSIS — E785 Hyperlipidemia, unspecified: Secondary | ICD-10-CM | POA: Diagnosis not present

## 2022-04-29 DIAGNOSIS — F17200 Nicotine dependence, unspecified, uncomplicated: Secondary | ICD-10-CM

## 2022-04-29 DIAGNOSIS — I10 Essential (primary) hypertension: Secondary | ICD-10-CM | POA: Diagnosis not present

## 2022-04-29 DIAGNOSIS — F32A Depression, unspecified: Secondary | ICD-10-CM | POA: Diagnosis not present

## 2022-04-29 DIAGNOSIS — E1165 Type 2 diabetes mellitus with hyperglycemia: Secondary | ICD-10-CM | POA: Diagnosis not present

## 2022-04-29 DIAGNOSIS — E109 Type 1 diabetes mellitus without complications: Secondary | ICD-10-CM

## 2022-04-29 MED ORDER — SEMAGLUTIDE (1 MG/DOSE) 4 MG/3ML ~~LOC~~ SOPN
1.0000 mg | PEN_INJECTOR | SUBCUTANEOUS | 0 refills | Status: DC
  Filled 2022-04-29 – 2022-04-30 (×2): qty 3, 28d supply, fill #0

## 2022-04-29 MED ORDER — SEMAGLUTIDE (2 MG/DOSE) 8 MG/3ML ~~LOC~~ SOPN
2.0000 mg | PEN_INJECTOR | SUBCUTANEOUS | 3 refills | Status: DC
  Filled 2022-04-29 – 2022-05-29 (×2): qty 3, 28d supply, fill #0
  Filled 2022-06-24 – 2022-06-25 (×2): qty 3, 28d supply, fill #1
  Filled 2022-07-20 – 2022-07-25 (×2): qty 3, 28d supply, fill #2
  Filled 2022-08-17: qty 3, 28d supply, fill #3

## 2022-04-29 NOTE — Patient Instructions (Addendum)
Magnesium Glycinate 400 mg at bedtime L Typtophan capsules for sleep  Start benefiber daily increase to twice daily then start adding Miralax to it up to twice daily   Carbohydrate Counting for Diabetes Mellitus, Adult Carbohydrate counting is a method of keeping track of how many carbohydrates you eat. Eating carbohydrates increases the amount of sugar (glucose) in the blood. Counting how many carbohydrates you eat improves how well you manage your blood glucose. This, in turn, helps you manage your diabetes. Carbohydrates are measured in grams (g) per serving. It is important to know how many carbohydrates (in grams or by serving size) you can have in each meal. This is different for every person. A dietitian can help you make a meal plan and calculate how many carbohydrates you should have at each meal and snack. What foods contain carbohydrates? Carbohydrates are found in the following foods: Grains, such as breads and cereals. Dried beans and soy products. Starchy vegetables, such as potatoes, peas, and corn. Fruit and fruit juices. Milk and yogurt. Sweets and snack foods, such as cake, cookies, candy, chips, and soft drinks. How do I count carbohydrates in foods? There are two ways to count carbohydrates in food. You can read food labels or learn standard serving sizes of foods. You can use either of these methods or a combination of both. Using the Nutrition Facts label The Nutrition Facts list is included on the labels of almost all packaged foods and beverages in the [place]. It includes: The serving size. Information about nutrients in each serving, including the grams of carbohydrate per serving. To use the Nutrition Facts, decide how many servings you will have. Then, multiply the number of servings by the number of carbohydrates per serving. The resulting number is the total grams of carbohydrates that you will be having. Learning the standard serving sizes of foods When  you eat carbohydrate foods that are not packaged or do not include Nutrition Facts on the label, you need to measure the servings in order to count the grams of carbohydrates. Measure the foods that you will eat with a food scale or measuring cup, if needed. Decide how many standard-size servings you will eat. Multiply the number of servings by 15. For foods that contain carbohydrates, one serving equals 15 g of carbohydrates. For example, if you eat 2 cups or 10 oz (300 g) of strawberries, you will have eaten 2 servings and 30 g of carbohydrates (2 servings x 15 g = 30 g). For foods that have more than one food mixed, such as soups and casseroles, you must count the carbohydrates in each food that is included. The following list contains standard serving sizes of common carbohydrate-rich foods. Each of these servings has about 15 g of carbohydrates: 1 slice of bread. 1 six-inch (15 cm) tortilla. ? cup or 2 oz (53 g) cooked rice or pasta.  cup or 3 oz (85 g) cooked or canned, drained and rinsed beans or lentils.  cup or 3 oz (85 g) starchy vegetable, such as peas, corn, or squash.  cup or 4 oz (120 g) hot cereal.  cup or 3 oz (85 g) boiled or mashed potatoes, or  or 3 oz (85 g) of a large baked potato.  cup or 4 fl oz (118 mL) fruit juice. 1 cup or 8 fl oz (237 mL) milk. 1 small or 4 oz (106 g) apple.  or 2 oz (63 g) of a medium banana. 1 cup or 5 oz (150  g) strawberries. 3 cups or 1 oz (28.3 g) popped popcorn. What is an example of carbohydrate counting? To calculate the grams of carbohydrates in this sample meal, follow the steps shown below. Sample meal 3 oz (85 g) chicken breast. ? cup or 4 oz (106 g) brown rice.  cup or 3 oz (85 g) corn. 1 cup or 8 fl oz (237 mL) milk. 1 cup or 5 oz (150 g) strawberries with sugar-free whipped topping. Carbohydrate calculation Identify the foods that contain carbohydrates: Rice. Corn. Milk. Strawberries. Calculate how many servings you  have of each food: 2 servings rice. 1 serving corn. 1 serving milk. 1 serving strawberries. Multiply each number of servings by 15 g: 2 servings rice x 15 g = 30 g. 1 serving corn x 15 g = 15 g. 1 serving milk x 15 g = 15 g. 1 serving strawberries x 15 g = 15 g. Add together all of the amounts to find the total grams of carbohydrates eaten: 30 g + 15 g + 15 g + 15 g = 75 g of carbohydrates total. What are tips for following this plan? Shopping Develop a meal plan and then make a shopping list. Buy fresh and frozen vegetables, fresh and frozen fruit, dairy, eggs, beans, lentils, and whole grains. Look at food labels. Choose foods that have more fiber and less sugar. Avoid processed foods and foods with added sugars. Meal planning Aim to have the same number of grams of carbohydrates at each meal and for each snack time. Plan to have regular, balanced meals and snacks. Where to find more information American Diabetes Association: diabetes.org Centers for Disease Control and Prevention: StoreMirror.com.[place] Academy of Nutrition and Dietetics: eatright.org Association of Diabetes Care & Education Specialists: diabeteseducator.org Summary Carbohydrate counting is a method of keeping track of how many carbohydrates you eat. Eating carbohydrates increases the amount of sugar (glucose) in your blood. Counting how many carbohydrates you eat improves how well you manage your blood glucose. This helps you manage your diabetes. A dietitian can help you make a meal plan and calculate how many carbohydrates you should have at each meal and snack. This information is not intended to replace advice given to you by your health care provider. Make sure you discuss any questions you have with your health care provider. Document Revised: 06/20/2020 Document Reviewed: 06/20/2020 Elsevier Patient Education  [place].

## 2022-04-29 NOTE — Progress Notes (Signed)
Subjective:   By signing my name below, I, [name], attest that this documentation has been prepared under the direction and in the presence of Bradd Canary, MD. 04/29/2022      Patient ID: Bonnie Dickson, female    DOB: 24-Jan-1971, 51 y.o.   MRN: [id number]  No chief complaint on file.   HPI Patient is in today for an office visit and 3 month f/u  She would like to change her endocrinologist. She would like to increase the dosage of ozempic. She is experiencing constipation and has one bowel movement a week. She has not tried miralax because of her work schedule.   She is using 1 mg Chantix and has stopped smoking.   Her blood pressure is stable at this visit. BP Readings from Last 3 Encounters:  04/29/22 118/80  03/05/22 137/88  02/17/22 118/88     Past Medical History:  Diagnosis Date   Arthritis    lower back   Blood transfusion without reported diagnosis    CAD (coronary artery disease)    a. NSTEMI 03/2021 s/p DES to LAD.   Depression    situational   Diabetes mellitus without complication (HCC)    Endometriosis    GERD (gastroesophageal reflux disease)    History of chicken pox    HSV-2 (herpes simplex virus 2) infection    Hyperglycemia 07/24/2019   Hyperlipidemia    Hypertension    Insomnia    Myocardial infarction (HCC) 03/31/2021   Snoring    Tonsillitis and adenoiditis, chronic    childhood, caused adult snoring    Past Surgical History:  Procedure Laterality Date   ABDOMINAL HYSTERECTOMY     ovaries left in place   BREAST CYST EXCISION Right    35 years ago    COMBINED ABDOMINOPLASTY AND LIPOSUCTION     CORONARY STENT INTERVENTION N/A 04/01/2021   Procedure: CORONARY STENT INTERVENTION;  Surgeon: Marykay Lex, MD;  Location: [facility];  Service: Cardiovascular;  Laterality: N/A;   ENDOMETRIAL ABLATION     ENDOMETRIAL BIOPSY     LAPAROSCOPIC ENDOMETRIOSIS FULGURATION     LEFT HEART CATH AND CORONARY ANGIOGRAPHY N/A 04/01/2021    Procedure: LEFT HEART CATH AND CORONARY ANGIOGRAPHY;  Surgeon: Marykay Lex, MD;  Location: [facility];  Service: Cardiovascular;  Laterality: N/A;   PARTIAL HYSTERECTOMY     vaginal   TUBAL LIGATION      Family History  Problem Relation Age of Onset   Hypertension Mother    Diabetes Mother        prediabetes   Hyperlipidemia Mother    Diabetes Father    Cataracts Father    Eczema Brother    Asthma Brother    Diabetes Maternal Grandmother    Hypertension Maternal Grandmother    Diabetes Maternal Grandfather    Hypertension Maternal Grandfather    Hyperlipidemia Maternal Grandfather    Heart failure Maternal Grandfather    COPD Maternal Grandfather    ODD Son    ADD / ADHD Son    Diabetes Daughter    Hypertension Daughter    Colon cancer Neg Hx    Stomach cancer Neg Hx    Pancreatic cancer Neg Hx    Esophageal cancer Neg Hx     Social History   Socioeconomic History   Marital status: Widowed    Spouse name: Not on file   Number of children: Not on file   Years of education: Not  on file   Highest education level: Not on file  Occupational History   Not on file  Tobacco Use   Smoking status: Former    Packs/day: 0.50    Years: 30.00    Pack years: 15.00    Types: Cigarettes    Quit date: 10/31/2021    Years since quitting: 0.4   Smokeless tobacco: Never  Vaping Use   Vaping Use: Never used  Substance and Sexual Activity   Alcohol use: Never   Drug use: Never   Sexual activity: Not Currently    Birth control/protection: Surgical  Other Topics Concern   Not on file  Social History Narrative   Not on file   Social Determinants of Health   Financial Resource Strain: Not on file  Food Insecurity: Not on file  Transportation Needs: Not on file  Physical Activity: Not on file  Stress: Not on file  Social Connections: Not on file  Intimate Partner Violence: Not on file    Outpatient Medications Prior to Visit  Medication Sig Dispense Refill    amLODipine (NORVASC) 10 MG tablet Take 1 tablet (10 mg total) by mouth daily. 90 tablet 1   aspirin (ASPIRIN LOW DOSE) 81 MG EC tablet Take 1 tablet (81 mg total) by mouth daily. Swallow whole. 30 tablet 11   carvedilol (COREG) 6.25 MG tablet Take 1 tablet (6.25 mg total) by mouth 2 (two) times daily with a meal. 60 tablet 6   clopidogrel (PLAVIX) 75 MG tablet On 8/13 take 4 tablets then starting 8/14, can take 1 tablet by mouth daily 90 tablet 3   insulin glargine (LANTUS SOLOSTAR) 100 UNIT/ML Solostar Pen Inject 20 Units into the skin daily. 15 mL 6   insulin lispro (HUMALOG KWIKPEN) 100 UNIT/ML KwikPen Max daily 30 units per correction scale 15 mL 6   Insulin Pen Needle 32G X 4 MM MISC Use as directed with Humalog up to 4 times a day; in the morning, at noon, in the evening, and at bedtime. 400 each 2   linaclotide (LINZESS) 145 MCG CAPS capsule Take 1 capsule (145 mcg total) by mouth daily before breakfast. (Patient not taking: Reported on 03/05/2022) 90 capsule 3   nitroGLYCERIN (NITROSTAT) 0.4 MG SL tablet Place 1 tablet (0.4 mg total) under the tongue every 5 (five) minutes as needed for chest pain (up to 3 doses). 25 tablet 3   rosuvastatin (CRESTOR) 20 MG tablet Take 1 tablet (20 mg total) by mouth daily. 90 tablet 3   Semaglutide,0.25 or 0.5MG /DOS, (OZEMPIC, 0.25 OR 0.5 MG/DOSE,) 2 MG/1.5ML SOPN Inject 0.5 mg into the skin once a week. 1.5 mL 3   Semaglutide,0.25 or 0.5MG /DOS, (OZEMPIC, 0.25 OR 0.5 MG/DOSE,) 2 MG/3ML SOPN Inject 0.5 mg into the skin once a week. 3 mL 2   tiZANidine (ZANAFLEX) 2 MG tablet Take 0.5-2 tablets (1-4 mg total) by mouth 2 (two) times daily as needed for muscle spasms. 40 tablet 1   valACYclovir (VALTREX) 500 MG tablet Take 1 tablet by mouth twice daily 180 tablet 1   varenicline (CHANTIX CONTINUING MONTH PAK) 1 MG tablet Take 1 tablet (1 mg total) by mouth 2 (two) times daily. 60 tablet 2   No facility-administered medications prior to visit.    Allergies   Allergen Reactions   Ciprofloxacin Hcl Anaphylaxis   Gentamicin Anaphylaxis   Penicillins Anaphylaxis   Ambien [Zolpidem Tartrate] Other (See Comments)    Sleep walking    Asa [Aspirin] Other (See Comments)  Rectal bleeding    Banana Itching, Swelling and Other (See Comments)    Tongue itches and swells- breathing not impaired   Ivp Dye [Iodinated Contrast Media] Nausea And Vomiting and Other (See Comments)    Severe vomiting    Lisinopril Cough   Metformin And Related Nausea Only and Other (See Comments)    Severe GI upset   Nsaids Other (See Comments)    Rectal bleeding    Pineapple Other (See Comments)    Causes a burning sensation in the mouth   Tape Other (See Comments)    Paper tape "burns the skin"   Codeine Rash   Flagyl [Metronidazole] Rash   Morphine And Related Rash    Review of Systems  Constitutional:  Negative for fever and malaise/fatigue.  HENT:  Negative for congestion.   Eyes:  Negative for redness.  Respiratory:  Negative for shortness of breath.   Cardiovascular:  Negative for chest pain, palpitations and leg swelling.  Gastrointestinal:  Positive for constipation. Negative for abdominal pain, blood in stool and nausea.  Genitourinary:  Negative for dysuria and frequency.  Musculoskeletal:  Negative for falls.  Skin:  Negative for rash.  Neurological:  Negative for dizziness, loss of consciousness and headaches.  Endo/Heme/Allergies:  Negative for polydipsia.  Psychiatric/Behavioral:  Negative for depression. The patient is not nervous/anxious.       Objective:    Physical Exam Constitutional:      General: She is not in acute distress.    Appearance: She is well-developed.  HENT:     Head: Normocephalic and atraumatic.  Eyes:     Conjunctiva/sclera: Conjunctivae normal.  Neck:     Thyroid: No thyromegaly.  Cardiovascular:     Rate and Rhythm: Normal rate and regular rhythm.     Heart sounds: Normal heart sounds. No murmur  heard. Pulmonary:     Effort: Pulmonary effort is normal. No respiratory distress.     Breath sounds: Normal breath sounds.  Abdominal:     General: Bowel sounds are normal. There is no distension.     Palpations: Abdomen is soft. There is no mass.     Tenderness: There is no abdominal tenderness.  Musculoskeletal:     Cervical back: Neck supple.  Lymphadenopathy:     Cervical: No cervical adenopathy.  Skin:    General: Skin is warm and dry.  Neurological:     Mental Status: She is alert and oriented to person, place, and time.  Psychiatric:        Behavior: Behavior normal.    There were no vitals taken for this visit. Wt Readings from Last 3 Encounters:  03/05/22 220 lb (99.8 kg)  02/17/22 220 lb (99.8 kg)  01/03/22 213 lb 12.8 oz (97 kg)    Diabetic Foot Exam - Simple   No data filed    Lab Results  Component Value Date   WBC 10.7 (H) 12/31/2021   HGB 13.4 12/31/2021   HCT 41.1 12/31/2021   PLT 393.0 12/31/2021   GLUCOSE 133 (H) 12/31/2021   CHOL 129 12/31/2021   TRIG 128.0 12/31/2021   HDL 36.90 (L) 12/31/2021   LDLCALC 67 12/31/2021   ALT 11 12/31/2021   AST 11 12/31/2021   NA 141 12/31/2021   K 3.9 12/31/2021   CL 102 12/31/2021   CREATININE 0.90 12/31/2021   BUN 15 12/31/2021   CO2 32 12/31/2021   TSH 1.46 12/31/2021   INR 0.9 03/31/2021   HGBA1C 8.5 (A) 11/05/2021  Lab Results  Component Value Date   TSH 1.46 12/31/2021   Lab Results  Component Value Date   WBC 10.7 (H) 12/31/2021   HGB 13.4 12/31/2021   HCT 41.1 12/31/2021   MCV 95.7 12/31/2021   PLT 393.0 12/31/2021   Lab Results  Component Value Date   NA 141 12/31/2021   K 3.9 12/31/2021   CO2 32 12/31/2021   GLUCOSE 133 (H) 12/31/2021   BUN 15 12/31/2021   CREATININE 0.90 12/31/2021   BILITOT 0.5 12/31/2021   ALKPHOS 78 12/31/2021   AST 11 12/31/2021   ALT 11 12/31/2021   PROT 7.6 12/31/2021   ALBUMIN 4.5 12/31/2021   CALCIUM 10.2 12/31/2021   ANIONGAP 10 04/02/2021    GFR 74.97 12/31/2021   Lab Results  Component Value Date   CHOL 129 12/31/2021   Lab Results  Component Value Date   HDL 36.90 (L) 12/31/2021   Lab Results  Component Value Date   LDLCALC 67 12/31/2021   Lab Results  Component Value Date   TRIG 128.0 12/31/2021   Lab Results  Component Value Date   CHOLHDL 4 12/31/2021   Lab Results  Component Value Date   HGBA1C 8.5 (A) 11/05/2021       Assessment & Plan:   Problem List Items Addressed This Visit   None    No orders of the defined types were placed in this encounter.   I,[name],acting as a Neurosurgeon for Danise Edge, MD.,have documented all relevant documentation on the behalf of Danise Edge, MD,as directed by  Danise Edge, MD while in the presence of Danise Edge, MD.   I, Bradd Canary, MD.  personally preformed the services described in this documentation.  All medical record entries made by the scribe were at my direction and in my presence.  I have reviewed the chart and discharge instructions (if applicable) and agree that the record reflects my personal performance and is accurate and complete. 04/29/2022

## 2022-04-30 ENCOUNTER — Other Ambulatory Visit (HOSPITAL_COMMUNITY): Payer: Self-pay

## 2022-04-30 LAB — COMPREHENSIVE METABOLIC PANEL
ALT: 15 U/L (ref 0–35)
AST: 16 U/L (ref 0–37)
Albumin: 4.2 g/dL (ref 3.5–5.2)
Alkaline Phosphatase: 85 U/L (ref 39–117)
BUN: 11 mg/dL (ref 6–23)
CO2: 29 mEq/L (ref 19–32)
Calcium: 9.7 mg/dL (ref 8.4–10.5)
Chloride: 100 mEq/L (ref 96–112)
Creatinine, Ser: 0.74 mg/dL (ref 0.40–1.20)
GFR: 94.6 mL/min (ref >60.00)
Glucose, Bld: 137 mg/dL — ABNORMAL HIGH (ref 70–99)
Potassium: 3.8 mEq/L (ref 3.5–5.1)
Sodium: 136 mEq/L (ref 135–145)
Total Bilirubin: 0.4 mg/dL (ref 0.2–1.2)
Total Protein: 7.8 g/dL (ref 6.0–8.3)

## 2022-04-30 LAB — LIPID PANEL
Cholesterol: 125 mg/dL (ref 0–200)
HDL: 38.8 mg/dL — ABNORMAL LOW (ref >39.00)
LDL Cholesterol: 64 mg/dL (ref 0–99)
NonHDL: 85.78
Total CHOL/HDL Ratio: 3
Triglycerides: 111 mg/dL (ref 0.0–149.0)
VLDL: 22.2 mg/dL (ref 0.0–40.0)

## 2022-04-30 LAB — CBC
HCT: 37.8 % (ref 36.0–46.0)
Hemoglobin: 12.6 g/dL (ref 12.0–15.0)
MCHC: 33.3 g/dL (ref 30.0–36.0)
MCV: 93.1 fl (ref 78.0–100.0)
Platelets: 393 10*3/uL (ref 150.0–400.0)
RBC: 4.06 Mil/uL (ref 3.87–5.11)
RDW: 14 % (ref 11.5–15.5)
WBC: 9.1 10*3/uL (ref 4.0–10.5)

## 2022-04-30 LAB — HEMOGLOBIN A1C: Hgb A1c MFr Bld: 8.1 % — ABNORMAL HIGH (ref 4.6–6.5)

## 2022-04-30 LAB — TSH: TSH: 3.22 u[IU]/mL (ref 0.35–5.50)
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
## 2022-04-30 NOTE — Assessment & Plan Note (Signed)
hgba1c acceptable, minimize simple carbs. Increase exercise as tolerated. Continue current meds ubcrease Ozempic over next to months to 10 weekly

## 2022-04-30 NOTE — Assessment & Plan Note (Signed)
Well controlled, no changes to meds. Encouraged heart healthy diet such as the DASH diet and exercise as tolerated.  °

## 2022-04-30 NOTE — Assessment & Plan Note (Addendum)
Encourage heart healthy diet such as MIND or DASH diet, increase exercise, avoid trans fats, simple carbohydrates and processed foods, consider a krill or fish or flaxseed oil cap daily.  Tolerating Rosuvastatin 

## 2022-04-30 NOTE — Assessment & Plan Note (Signed)
Is using Chantix, ans currently not smoking.

## 2022-05-08 ENCOUNTER — Other Ambulatory Visit (HOSPITAL_COMMUNITY): Payer: Self-pay

## 2022-05-26 ENCOUNTER — Encounter: Payer: Self-pay | Admitting: Internal Medicine

## 2022-05-26 ENCOUNTER — Ambulatory Visit (INDEPENDENT_AMBULATORY_CARE_PROVIDER_SITE_OTHER): Payer: BC Managed Care – PPO | Admitting: Internal Medicine

## 2022-05-26 VITALS — BP 120/81 | HR 76 | Ht 67.0 in | Wt 220.0 lb

## 2022-05-26 DIAGNOSIS — I2510 Atherosclerotic heart disease of native coronary artery without angina pectoris: Secondary | ICD-10-CM

## 2022-05-26 DIAGNOSIS — E785 Hyperlipidemia, unspecified: Secondary | ICD-10-CM

## 2022-05-26 DIAGNOSIS — I255 Ischemic cardiomyopathy: Secondary | ICD-10-CM | POA: Diagnosis not present

## 2022-05-26 DIAGNOSIS — E119 Type 2 diabetes mellitus without complications: Secondary | ICD-10-CM

## 2022-05-29 ENCOUNTER — Other Ambulatory Visit (HOSPITAL_COMMUNITY): Payer: Self-pay

## 2022-06-01 ENCOUNTER — Other Ambulatory Visit: Payer: Self-pay | Admitting: Adult Health

## 2022-06-01 ENCOUNTER — Other Ambulatory Visit: Payer: Self-pay | Admitting: Internal Medicine

## 2022-06-02 ENCOUNTER — Other Ambulatory Visit (HOSPITAL_COMMUNITY): Payer: Self-pay

## 2022-06-02 MED ORDER — CARVEDILOL 6.25 MG PO TABS
6.2500 mg | ORAL_TABLET | Freq: Two times a day (BID) | ORAL | 6 refills | Status: DC
  Filled 2022-06-02: qty 60, 30d supply, fill #0
  Filled 2022-07-03: qty 60, 30d supply, fill #1
  Filled 2022-08-07: qty 60, 30d supply, fill #2
  Filled 2022-09-03: qty 60, 30d supply, fill #3
  Filled 2022-10-03: qty 60, 30d supply, fill #4
  Filled 2022-11-04: qty 60, 30d supply, fill #5
  Filled 2022-12-09: qty 60, 30d supply, fill #6

## 2022-06-02 MED ORDER — AMLODIPINE BESYLATE 10 MG PO TABS
10.0000 mg | ORAL_TABLET | Freq: Every day | ORAL | 1 refills | Status: DC
  Filled 2022-06-02 – 2022-07-03 (×2): qty 90, 90d supply, fill #0
  Filled 2022-10-03: qty 90, 90d supply, fill #1

## 2022-06-04 ENCOUNTER — Other Ambulatory Visit (HOSPITAL_COMMUNITY): Payer: Self-pay

## 2022-06-11 ENCOUNTER — Ambulatory Visit (HOSPITAL_COMMUNITY): Payer: BC Managed Care – PPO | Attending: Internal Medicine

## 2022-06-11 DIAGNOSIS — I255 Ischemic cardiomyopathy: Secondary | ICD-10-CM | POA: Insufficient documentation

## 2022-06-11 LAB — ECHOCARDIOGRAM COMPLETE
Area-P 1/2: 3.48 cm2
S' Lateral: 3 cm

## 2022-06-21 ENCOUNTER — Other Ambulatory Visit: Payer: Self-pay | Admitting: Physician Assistant

## 2022-06-23 ENCOUNTER — Other Ambulatory Visit (HOSPITAL_COMMUNITY): Payer: Self-pay

## 2022-06-23 MED ORDER — NITROGLYCERIN 0.4 MG SL SUBL
0.4000 mg | SUBLINGUAL_TABLET | SUBLINGUAL | 3 refills | Status: DC | PRN
  Filled 2022-06-23: qty 25, 5d supply, fill #0

## 2022-06-24 ENCOUNTER — Other Ambulatory Visit (HOSPITAL_COMMUNITY): Payer: Self-pay

## 2022-06-25 ENCOUNTER — Other Ambulatory Visit (HOSPITAL_COMMUNITY): Payer: Self-pay

## 2022-06-25 ENCOUNTER — Other Ambulatory Visit (HOSPITAL_BASED_OUTPATIENT_CLINIC_OR_DEPARTMENT_OTHER): Payer: Self-pay

## 2022-07-03 ENCOUNTER — Other Ambulatory Visit (HOSPITAL_COMMUNITY): Payer: Self-pay

## 2022-07-03 ENCOUNTER — Encounter: Payer: BC Managed Care – PPO | Admitting: Family Medicine

## 2022-07-18 ENCOUNTER — Encounter: Payer: Self-pay | Admitting: Family Medicine

## 2022-07-21 ENCOUNTER — Other Ambulatory Visit (HOSPITAL_BASED_OUTPATIENT_CLINIC_OR_DEPARTMENT_OTHER): Payer: Self-pay

## 2022-07-24 ENCOUNTER — Other Ambulatory Visit (HOSPITAL_BASED_OUTPATIENT_CLINIC_OR_DEPARTMENT_OTHER): Payer: Self-pay

## 2022-07-24 ENCOUNTER — Other Ambulatory Visit (HOSPITAL_COMMUNITY): Payer: Self-pay

## 2022-07-25 ENCOUNTER — Other Ambulatory Visit (HOSPITAL_COMMUNITY): Payer: Self-pay

## 2022-08-06 NOTE — Assessment & Plan Note (Signed)
Well controlled, no changes to meds. Encouraged heart healthy diet such as the DASH diet and exercise as tolerated.  °

## 2022-08-06 NOTE — Assessment & Plan Note (Signed)
hgba1c acceptable, minimize simple carbs. Increase exercise as tolerated. Continue current meds 

## 2022-08-06 NOTE — Assessment & Plan Note (Signed)
Encourage heart healthy diet such as MIND or DASH diet, increase exercise, avoid trans fats, simple carbohydrates and processed foods, consider a krill or fish or flaxseed oil cap daily. Tolerate Rosuvastatin

## 2022-08-07 ENCOUNTER — Other Ambulatory Visit (HOSPITAL_COMMUNITY): Payer: Self-pay

## 2022-08-07 ENCOUNTER — Ambulatory Visit (INDEPENDENT_AMBULATORY_CARE_PROVIDER_SITE_OTHER): Payer: BC Managed Care – PPO | Admitting: Family Medicine

## 2022-08-07 VITALS — BP 122/82 | HR 82 | Temp 98.0°F | Resp 16 | Ht 67.0 in | Wt 215.0 lb

## 2022-08-07 DIAGNOSIS — Z1239 Encounter for other screening for malignant neoplasm of breast: Secondary | ICD-10-CM

## 2022-08-07 DIAGNOSIS — F17200 Nicotine dependence, unspecified, uncomplicated: Secondary | ICD-10-CM

## 2022-08-07 DIAGNOSIS — I10 Essential (primary) hypertension: Secondary | ICD-10-CM | POA: Diagnosis not present

## 2022-08-07 DIAGNOSIS — E109 Type 1 diabetes mellitus without complications: Secondary | ICD-10-CM | POA: Diagnosis not present

## 2022-08-07 DIAGNOSIS — E785 Hyperlipidemia, unspecified: Secondary | ICD-10-CM

## 2022-08-07 DIAGNOSIS — Z0000 Encounter for general adult medical examination without abnormal findings: Secondary | ICD-10-CM

## 2022-08-07 DIAGNOSIS — Z23 Encounter for immunization: Secondary | ICD-10-CM | POA: Diagnosis not present

## 2022-08-07 MED ORDER — CHANTIX STARTING MONTH PAK 0.5 MG X 11 & 1 MG X 42 PO TBPK
ORAL_TABLET | ORAL | 0 refills | Status: DC
Start: 2022-08-07 — End: 2022-08-10
  Filled 2022-08-07: qty 1, fill #0

## 2022-08-07 MED ORDER — VARENICLINE TARTRATE 1 MG PO TABS
1.0000 mg | ORAL_TABLET | Freq: Two times a day (BID) | ORAL | 5 refills | Status: DC
  Filled 2022-08-07: qty 60, 30d supply, fill #0
  Filled 2022-09-03: qty 60, 30d supply, fill #1
  Filled 2022-10-03: qty 60, 30d supply, fill #2
  Filled 2022-11-04: qty 60, 30d supply, fill #3
  Filled 2022-12-09: qty 60, 30d supply, fill #4
  Filled 2023-01-11: qty 60, 30d supply, fill #5

## 2022-08-07 NOTE — Progress Notes (Signed)
Subjective:   By signing my name below, I, Vickey Sages, attest that this documentation has been prepared under the direction and in the presence of Bradd Canary, MD 08/07/2022.    Patient ID: Bonnie Dickson, female    DOB: 10-15-1971, 51 y.o.   MRN: [id number]  No chief complaint on file.  HPI Patient is in today for an office visit.  Bowels: She is currently taking Linzess 145 mcg to manage her irregular bowel movements. She reports that Linzess has been effective and she has a bowel movement every 2-3 days.   Chantix: She reports that Chantix is effective at reducing her smoking cravings and she is requesting refills on this. She states that she smokes a quarter of a packet of cigarettes daily.   Feet swelling: She reports that the swelling in her feet has subsided but not entirely.   Blood pressure: Her blood pressure is within normal range today.  BP Readings from Last 3 Encounters:  08/07/22 122/82  05/26/22 120/81  04/29/22 118/80   Blood sugar: She monitors her blood sugar intermittently as her insurance has stopped paying for her DEXCOM sensor. She reports that her blood sugar has been relatively normal.   Weight: She is attempting to lose weight but says that it has been difficult recently.  Wt Readings from Last 3 Encounters:  08/07/22 215 lb (97.5 kg)  05/26/22 220 lb (99.8 kg)  04/29/22 219 lb (99.3 kg)   Immunizations: She has been informed about receiving COVID-19 and Flu immunizations. She is due for PREVNAR-20  and Shingles immunizations.   Past Medical History:  Diagnosis Date   Arthritis    lower back   Blood transfusion without reported diagnosis    CAD (coronary artery disease)    a. NSTEMI 03/2021 s/p DES to LAD.   Depression    situational   Diabetes mellitus without complication (HCC)    Endometriosis    GERD (gastroesophageal reflux disease)    History of chicken pox    HSV-2 (herpes simplex virus 2) infection    Hyperglycemia 07/24/2019    Hyperlipidemia    Hypertension    Insomnia    Myocardial infarction (HCC) 03/31/2021   Snoring    Tonsillitis and adenoiditis, chronic    childhood, caused adult snoring   Past Surgical History:  Procedure Laterality Date   ABDOMINAL HYSTERECTOMY     ovaries left in place   BREAST CYST EXCISION Right    35 years ago    COMBINED ABDOMINOPLASTY AND LIPOSUCTION     CORONARY STENT INTERVENTION N/A 04/01/2021   Procedure: CORONARY STENT INTERVENTION;  Surgeon: Marykay Lex, MD;  Location: [facility];  Service: Cardiovascular;  Laterality: N/A;   ENDOMETRIAL ABLATION     ENDOMETRIAL BIOPSY     LAPAROSCOPIC ENDOMETRIOSIS FULGURATION     LEFT HEART CATH AND CORONARY ANGIOGRAPHY N/A 04/01/2021   Procedure: LEFT HEART CATH AND CORONARY ANGIOGRAPHY;  Surgeon: Marykay Lex, MD;  Location: [facility];  Service: Cardiovascular;  Laterality: N/A;   PARTIAL HYSTERECTOMY     vaginal   TUBAL LIGATION     Family History  Problem Relation Age of Onset   Hypertension Mother    Diabetes Mother        prediabetes   Hyperlipidemia Mother    Diabetes Father    Cataracts Father    Eczema Brother    Asthma Brother    Diabetes Maternal Grandmother    Hypertension Maternal Grandmother  Diabetes Maternal Grandfather    Hypertension Maternal Grandfather    Hyperlipidemia Maternal Grandfather    Heart failure Maternal Grandfather    COPD Maternal Grandfather    ODD Son    ADD / ADHD Son    Diabetes Daughter    Hypertension Daughter    Colon cancer Neg Hx    Stomach cancer Neg Hx    Pancreatic cancer Neg Hx    Esophageal cancer Neg Hx    Social History   Socioeconomic History   Marital status: Widowed    Spouse name: Not on file   Number of children: Not on file   Years of education: Not on file   Highest education level: Not on file  Occupational History   Not on file  Tobacco Use   Smoking status: Former    Packs/day: 0.50    Years: 30.00    Total pack  years: 15.00    Types: Cigarettes    Quit date: 10/31/2021    Years since quitting: 0.7   Smokeless tobacco: Never  Vaping Use   Vaping Use: Never used  Substance and Sexual Activity   Alcohol use: Never   Drug use: Never   Sexual activity: Not Currently    Birth control/protection: Surgical  Other Topics Concern   Not on file  Social History Narrative   Not on file   Social Determinants of Health   Financial Resource Strain: Not on file  Food Insecurity: Not on file  Transportation Needs: Not on file  Physical Activity: Not on file  Stress: Not on file  Social Connections: Not on file  Intimate Partner Violence: Not on file   Outpatient Medications Prior to Visit  Medication Sig Dispense Refill   amLODipine (NORVASC) 10 MG tablet Take 1 tablet (10 mg total) by mouth daily. 90 tablet 1   aspirin EC (ASPIRIN LOW DOSE) 81 MG tablet Take 1 tablet (81 mg total) by mouth daily. Swallow whole. 30 tablet 11   carvedilol (COREG) 6.25 MG tablet Take 1 tablet (6.25 mg total) by mouth 2 (two) times daily with a meal. 60 tablet 6   insulin aspart (NOVOLOG) 100 UNIT/ML injection INJECT 2 TO 10 UNITS SUBCUTANEOUSLY THREE TIMES DAILY BEFORE MEAL(S)201-250 2 UNITS, 251-300 4 UNITS, 301-350 6 UNITS, 351-400 8 UNITS, 401-450 10 UNITS     insulin glargine (LANTUS SOLOSTAR) 100 UNIT/ML Solostar Pen Inject 20 Units into the skin daily. (Patient not taking: Reported on 05/26/2022) 15 mL 6   insulin lispro (HUMALOG KWIKPEN) 100 UNIT/ML KwikPen Max daily 30 units per correction scale 15 mL 6   Insulin Pen Needle 32G X 4 MM MISC Use as directed with Humalog up to 4 times a day; in the morning, at noon, in the evening, and at bedtime. 400 each 2   linaclotide (LINZESS) 145 MCG CAPS capsule Take 1 capsule (145 mcg total) by mouth daily before breakfast. 90 capsule 3   nitroGLYCERIN (NITROSTAT) 0.4 MG SL tablet Place 1 tablet (0.4 mg total) under the tongue every 5 (five) minutes as needed for chest pain (up  to 3 doses). 25 tablet 3   rosuvastatin (CRESTOR) 20 MG tablet Take 1 tablet (20 mg total) by mouth daily. 90 tablet 3   Semaglutide, 1 MG/DOSE, 4 MG/3ML SOPN Inject 1 mg as directed once a week. 3 mL 0   Semaglutide, 2 MG/DOSE, 8 MG/3ML SOPN Inject 2 mg as directed once a week. 3 mL 3   tiZANidine (ZANAFLEX) 2 MG tablet  Take 0.5-2 tablets (1-4 mg total) by mouth 2 (two) times daily as needed for muscle spasms. 40 tablet 1   valACYclovir (VALTREX) 500 MG tablet Take 1 tablet by mouth twice daily 180 tablet 1   varenicline (CHANTIX CONTINUING MONTH PAK) 1 MG tablet Take 1 tablet (1 mg total) by mouth 2 (two) times daily. (Patient not taking: Reported on 05/26/2022) 60 tablet 2   No facility-administered medications prior to visit.   Allergies  Allergen Reactions   Atorvastatin     Other reaction(s): myalgias (muscle pain)   Ciprofloxacin Hcl Anaphylaxis   Gentamicin Anaphylaxis   Penicillins Anaphylaxis   Ambien [Zolpidem Tartrate] Other (See Comments)    Sleep walking    Asa [Aspirin] Other (See Comments)    Rectal bleeding    Banana Itching, Swelling and Other (See Comments)    Tongue itches and swells- breathing not impaired   Ivp Dye [Iodinated Contrast Media] Nausea And Vomiting and Other (See Comments)    Severe vomiting    Lisinopril Cough   Metformin And Related Nausea Only and Other (See Comments)    Severe GI upset   Nsaids Other (See Comments)    Rectal bleeding    Pineapple Other (See Comments)    Causes a burning sensation in the mouth   Tape Other (See Comments)    Paper tape "burns the skin"   Codeine Rash   Flagyl [Metronidazole] Rash   Morphine And Related Rash   ROS     Objective:    Physical Exam Constitutional:      General: She is not in acute distress.    Appearance: Normal appearance. She is not ill-appearing.  HENT:     Head: Normocephalic and atraumatic.     Right Ear: External ear normal.     Left Ear: External ear normal.      Mouth/Throat:     Mouth: Mucous membranes are moist.     Pharynx: Oropharynx is clear.  Eyes:     Extraocular Movements: Extraocular movements intact.     Pupils: Pupils are equal, round, and reactive to light.  Cardiovascular:     Rate and Rhythm: Normal rate and regular rhythm.     Pulses: Normal pulses.     Heart sounds: Normal heart sounds. No murmur heard.    No gallop.  Pulmonary:     Effort: Pulmonary effort is normal. No respiratory distress.     Breath sounds: Normal breath sounds. No wheezing or rales.  Abdominal:     General: Bowel sounds are normal.  Skin:    General: Skin is warm and dry.  Neurological:     Mental Status: She is alert and oriented to person, place, and time.  Psychiatric:        Mood and Affect: Mood normal.        Behavior: Behavior normal.        Judgment: Judgment normal.    There were no vitals taken for this visit. Wt Readings from Last 3 Encounters:  05/26/22 220 lb (99.8 kg)  04/29/22 219 lb (99.3 kg)  03/05/22 220 lb (99.8 kg)   Diabetic Foot Exam - Simple   No data filed    Lab Results  Component Value Date   WBC 9.1 04/29/2022   HGB 12.6 04/29/2022   HCT 37.8 04/29/2022   PLT 393.0 04/29/2022   GLUCOSE 137 (H) 04/29/2022   CHOL 125 04/29/2022   TRIG 111.0 04/29/2022   HDL 38.80 (L) 04/29/2022  LDLCALC 64 04/29/2022   ALT 15 04/29/2022   AST 16 04/29/2022   NA 136 04/29/2022   K 3.8 04/29/2022   CL 100 04/29/2022   CREATININE 0.74 04/29/2022   BUN 11 04/29/2022   CO2 29 04/29/2022   TSH 3.22 04/29/2022   INR 0.9 03/31/2021   HGBA1C 8.1 (H) 04/29/2022   Lab Results  Component Value Date   TSH 3.22 04/29/2022   Lab Results  Component Value Date   WBC 9.1 04/29/2022   HGB 12.6 04/29/2022   HCT 37.8 04/29/2022   MCV 93.1 04/29/2022   PLT 393.0 04/29/2022   Lab Results  Component Value Date   NA 136 04/29/2022   K 3.8 04/29/2022   CO2 29 04/29/2022   GLUCOSE 137 (H) 04/29/2022   BUN 11 04/29/2022    CREATININE 0.74 04/29/2022   BILITOT 0.4 04/29/2022   ALKPHOS 85 04/29/2022   AST 16 04/29/2022   ALT 15 04/29/2022   PROT 7.8 04/29/2022   ALBUMIN 4.2 04/29/2022   CALCIUM 9.7 04/29/2022   ANIONGAP 10 04/02/2021   GFR 94.60 04/29/2022   Lab Results  Component Value Date   CHOL 125 04/29/2022   Lab Results  Component Value Date   HDL 38.80 (L) 04/29/2022   Lab Results  Component Value Date   LDLCALC 64 04/29/2022   Lab Results  Component Value Date   TRIG 111.0 04/29/2022   Lab Results  Component Value Date   CHOLHDL 3 04/29/2022   Lab Results  Component Value Date   HGBA1C 8.1 (H) 04/29/2022      Assessment & Plan:   Problem List Items Addressed This Visit       Cardiovascular and Mediastinum   Hypertension     Other   Diabetes mellitus, labile (HCC)   Hyperlipidemia LDL goal <70   No orders of the defined types were placed in this encounter.  I, Vickey Sages, personally preformed the services described in this documentation.  All medical record entries made by the scribe were at my direction and in my presence.  I have reviewed the chart and discharge instructions (if applicable) and agree that the record reflects my personal performance and is accurate and complete. 08/07/2022  I,Mohammed Iqbal,acting as a scribe for Danise Edge, MD.,have documented all relevant documentation on the behalf of Danise Edge, MD,as directed by  Danise Edge, MD while in the presence of Danise Edge, MD.  Vickey Sages

## 2022-08-07 NOTE — Patient Instructions (Addendum)
Shingrix is the new shingles shot, 2 shots over 2-6 months, confirm coverage with insurance and document, then can return here for shots with nurse appt or at pharmacy Varenicline Tablets What is this medication? VARENICLINE (var e NI kleen) helps you quit smoking. It reduces cravings for nicotine, the addictive substance found in tobacco. It is most effective when used in combination with a stop-smoking program. This medicine may be used for other purposes; ask your health care provider or pharmacist if you have questions. COMMON BRAND NAME(S): Chantix What should I tell my care team before I take this medication? They need to know if you have any of these conditions: Heart disease Frequently drink alcohol Kidney disease Mental health condition On hemodialysis Seizures History of stroke Suicidal thoughts, plans, or attempt by you or a family member An unusual or allergic reaction to varenicline, other medications, foods, dyes, or preservatives Pregnant or trying to get pregnant Breast-feeding How should I use this medication? Take this medication by mouth after eating. Take with a full glass of water. Follow the directions on the prescription label. Take your doses at regular intervals. Do not take your medication more often than directed. There are 3 ways you can use this medication to help you quit smoking; talk to your care team to decide which plan is right for you: 1) you can choose a quit date and start this medication 1 week before the quit date, or, 2) you can start taking this medication before you choose a quit date, and then pick a quit date between day 8 and 35 days of treatment, or, 3) if you are not sure that you are able or willing to quit smoking right away, start taking this medication and slowly decrease the amount you smoke as directed by your care team with the goal of being cigarette-free by week 12 of treatment. Stick to your plan; ask about support groups or other ways  to help you remain cigarette-free. If you are motivated to quit smoking and did not succeed during a previous attempt with this medication for reasons other than side effects, or if you returned to smoking after this treatment, speak with your care team about whether another course of this medication may be right for you. A special MedGuide will be given to you by the pharmacist with each prescription and refill. Be sure to read this information carefully each time. Talk to your care team about the use of this medication in children. This medication is not approved for use in children. Overdosage: If you think you have taken too much of this medicine contact a poison control center or emergency room at once. NOTE: This medicine is only for you. Do not share this medicine with others. What if I miss a dose? If you miss a dose, take it as soon as you can. If it is almost time for your next dose, take only that dose. Do not take double or extra doses. What may interact with this medication? Alcohol Insulin Other medications used to help people quit smoking Theophylline Warfarin This list may not describe all possible interactions. Give your health care provider a list of all the medicines, herbs, non-prescription drugs, or dietary supplements you use. Also tell them if you smoke, drink alcohol, or use illegal drugs. Some items may interact with your medicine. What should I watch for while using this medication? It is okay if you do not succeed at your attempt to quit and have a cigarette. You can  still continue your quit attempt and keep using this medication as directed. Just throw away your cigarettes and get back to your quit plan. Talk to your care team before using other treatments to quit smoking. Using this medication with other treatments to quit smoking may increase the risk for side effects compared to using a treatment alone. This medication may affect your coordination, reaction time, or  judgment. Do not drive or operate machinery until you know how this medication affects you. Sit up or stand slowly to reduce the risk of dizzy or fainting spells. Decrease the number of alcoholic beverages that you drink during treatment with this medication until you know if this medication affects your ability to tolerate alcohol. Some people have experienced increased drunkenness (intoxication), unusual or sometimes aggressive behavior, or no memory of things that have happened (amnesia) during treatment with this medication. You may do unusual sleep behaviors or activities you do not remember the day after taking this medication. Activities include driving, making or eating food, talking on the phone, sexual activity, or sleep walking. Stop taking this medication and call your care team right away if you find out you have done activities like this. Patients and their families should watch out for new or worsening depression or thoughts of suicide. Also watch out for sudden changes in feelings such as feeling anxious, agitated, panicky, irritable, hostile, aggressive, impulsive, severely restless, overly excited and hyperactive, or not being able to sleep. If this happens, call your care team. If you have diabetes, and you quit smoking, the effects of insulin may be increased. You may need to reduce your insulin dose. Check with your care team about how you should adjust your insulin dose. What side effects may I notice from receiving this medication? Side effects that you should report to your care team as soon as possible: Allergic reactions or angioedema--skin rash, itching or hives, swelling of the face, eyes, lips, tongue, arms, or legs, trouble swallowing or breathing Heart attack--pain or tightness in the chest, shoulders, arms, or jaw, nausea, shortness of breath, cold or clammy skin, feeling faint or lightheaded Mood and behavior changes--anxiety, nervousness, confusion, hallucinations,  irritability, hostility, thoughts of suicide or self-harm, worsening mood, feelings of depression Redness, blistering, peeling, or loosening of the skin, including inside the mouth Stroke--sudden numbness or weakness of the face, arm, or leg, trouble speaking, confusion, trouble walking, loss of balance or coordination, dizziness, severe headache, change in vision Seizures Side effects that usually do not require medical attention (report to your care team if they continue or are bothersome): Constipation Drowsiness Gas Nausea Trouble sleeping Upset stomach Vivid dreams or nightmares Vomiting This list may not describe all possible side effects. Call your doctor for medical advice about side effects. You may report side effects to FDA at 1-800-FDA-1088. Where should I keep my medication? Keep out of the reach of children and pets. Store at room temperature between 15 and 30 degrees C (59 and 86 degrees F). Throw away any unused medication after the expiration date. NOTE: This sheet is a summary. It may not cover all possible information. If you have questions about this medicine, talk to your doctor, pharmacist, or health care provider.  2023 Elsevier/Gold Standard (2021-10-04 00:00:00)

## 2022-08-08 ENCOUNTER — Other Ambulatory Visit (HOSPITAL_COMMUNITY): Payer: Self-pay

## 2022-08-08 LAB — COMPREHENSIVE METABOLIC PANEL
ALT: 15 U/L (ref 0–35)
AST: 15 U/L (ref 0–37)
Albumin: 4.2 g/dL (ref 3.5–5.2)
Alkaline Phosphatase: 90 U/L (ref 39–117)
BUN: 7 mg/dL (ref 6–23)
CO2: 29 mEq/L (ref 19–32)
Calcium: 9.6 mg/dL (ref 8.4–10.5)
Chloride: 102 mEq/L (ref 96–112)
Creatinine, Ser: 0.71 mg/dL (ref 0.40–1.20)
GFR: 99.22 mL/min (ref >60.00)
Glucose, Bld: 107 mg/dL — ABNORMAL HIGH (ref 70–99)
Potassium: 3.5 mEq/L (ref 3.5–5.1)
Sodium: 138 mEq/L (ref 135–145)
Total Bilirubin: 0.4 mg/dL (ref 0.2–1.2)
Total Protein: 7.9 g/dL (ref 6.0–8.3)

## 2022-08-08 LAB — TSH: TSH: 1.32 u[IU]/mL (ref 0.35–5.50)

## 2022-08-08 LAB — CBC
HCT: 42.1 % (ref 36.0–46.0)
Hemoglobin: 13.8 g/dL (ref 12.0–15.0)
MCHC: 32.8 g/dL (ref 30.0–36.0)
MCV: 93.9 fl (ref 78.0–100.0)
Platelets: 359 10*3/uL (ref 150.0–400.0)
RBC: 4.48 Mil/uL (ref 3.87–5.11)
RDW: 13.8 % (ref 11.5–15.5)
WBC: 7.7 10*3/uL (ref 4.0–10.5)

## 2022-08-08 LAB — LIPID PANEL
Cholesterol: 113 mg/dL (ref 0–200)
HDL: 34.1 mg/dL — ABNORMAL LOW (ref >39.00)
LDL Cholesterol: 60 mg/dL (ref 0–99)
NonHDL: 79.07
Total CHOL/HDL Ratio: 3
Triglycerides: 97 mg/dL (ref 0.0–149.0)
VLDL: 19.4 mg/dL (ref 0.0–40.0)

## 2022-08-08 LAB — HEMOGLOBIN A1C: Hgb A1c MFr Bld: 8.6 % — ABNORMAL HIGH (ref 4.6–6.5)

## 2022-08-10 MED ORDER — VARENICLINE TARTRATE 0.5 MG PO TABS
ORAL_TABLET | ORAL | 0 refills | Status: AC
  Filled 2022-08-10: qty 11, 7d supply, fill #0

## 2022-08-10 NOTE — Assessment & Plan Note (Signed)
MGM ordered today, Pneumonia shot given today

## 2022-08-10 NOTE — Assessment & Plan Note (Addendum)
She was able to stop while taking Chantix but now that she has run out she is smoking about a 1/4 ppd again. Will try another course of Varenicline and reevaluate.

## 2022-08-11 ENCOUNTER — Other Ambulatory Visit (HOSPITAL_COMMUNITY): Payer: Self-pay

## 2022-08-18 ENCOUNTER — Other Ambulatory Visit (HOSPITAL_COMMUNITY): Payer: Self-pay

## 2022-08-25 ENCOUNTER — Encounter (HOSPITAL_BASED_OUTPATIENT_CLINIC_OR_DEPARTMENT_OTHER): Payer: Self-pay

## 2022-08-25 ENCOUNTER — Ambulatory Visit (HOSPITAL_BASED_OUTPATIENT_CLINIC_OR_DEPARTMENT_OTHER)
Admission: RE | Admit: 2022-08-25 | Discharge: 2022-08-25 | Disposition: A | Discharge status: Home / Self Care | Payer: BC Managed Care – PPO | Source: Ambulatory Visit | Attending: Family Medicine | Admitting: Family Medicine

## 2022-08-25 DIAGNOSIS — Z1231 Encounter for screening mammogram for malignant neoplasm of breast: Secondary | ICD-10-CM | POA: Diagnosis not present

## 2022-08-25 DIAGNOSIS — Z1239 Encounter for other screening for malignant neoplasm of breast: Secondary | ICD-10-CM

## 2022-09-03 ENCOUNTER — Other Ambulatory Visit (HOSPITAL_COMMUNITY): Payer: Self-pay

## 2022-09-04 LAB — HM DIABETES EYE EXAM

## 2022-09-16 ENCOUNTER — Encounter: Payer: BC Managed Care – PPO | Admitting: Family Medicine

## 2022-09-16 ENCOUNTER — Other Ambulatory Visit: Payer: Self-pay | Admitting: Family Medicine

## 2022-09-16 MED ORDER — OZEMPIC (2 MG/DOSE) 8 MG/3ML ~~LOC~~ SOPN
2.0000 mg | PEN_INJECTOR | SUBCUTANEOUS | 3 refills | Status: DC
  Filled 2022-09-16 – 2022-09-18 (×2): qty 3, 28d supply, fill #0
  Filled 2022-10-12: qty 3, 28d supply, fill #1
  Filled 2022-11-11: qty 3, 28d supply, fill #2
  Filled 2022-12-09: qty 3, 28d supply, fill #3

## 2022-09-17 ENCOUNTER — Other Ambulatory Visit (HOSPITAL_COMMUNITY): Payer: Self-pay

## 2022-09-17 ENCOUNTER — Other Ambulatory Visit: Payer: Self-pay | Admitting: Internal Medicine

## 2022-09-18 ENCOUNTER — Other Ambulatory Visit (HOSPITAL_COMMUNITY): Payer: Self-pay

## 2022-09-18 ENCOUNTER — Other Ambulatory Visit (HOSPITAL_BASED_OUTPATIENT_CLINIC_OR_DEPARTMENT_OTHER): Payer: Self-pay

## 2022-09-18 MED ORDER — INSULIN LISPRO (1 UNIT DIAL) 100 UNIT/ML (KWIKPEN)
30.0000 [IU] | PEN_INJECTOR | Freq: Every day | SUBCUTANEOUS | 0 refills | Status: AC
  Filled 2022-09-18: qty 6, 20d supply, fill #0

## 2022-10-03 ENCOUNTER — Other Ambulatory Visit (HOSPITAL_COMMUNITY): Payer: Self-pay

## 2022-10-03 ENCOUNTER — Encounter: Payer: Self-pay | Admitting: Family Medicine

## 2022-10-03 MED ORDER — VALACYCLOVIR HCL 500 MG PO TABS
500.0000 mg | ORAL_TABLET | Freq: Two times a day (BID) | ORAL | 1 refills | Status: DC
  Filled 2022-10-03: qty 180, 90d supply, fill #0
  Filled 2023-01-11: qty 180, 90d supply, fill #1
  Filled 2023-01-16: qty 60, 30d supply, fill #1
  Filled 2023-02-09 – 2023-02-13 (×2): qty 60, 30d supply, fill #2
  Filled 2023-03-15: qty 60, 30d supply, fill #3

## 2022-10-13 ENCOUNTER — Other Ambulatory Visit (HOSPITAL_BASED_OUTPATIENT_CLINIC_OR_DEPARTMENT_OTHER): Payer: Self-pay

## 2022-10-13 ENCOUNTER — Other Ambulatory Visit (HOSPITAL_COMMUNITY): Payer: Self-pay

## 2022-10-27 ENCOUNTER — Other Ambulatory Visit (HOSPITAL_COMMUNITY): Payer: Self-pay

## 2022-11-04 ENCOUNTER — Other Ambulatory Visit (HOSPITAL_COMMUNITY): Payer: Self-pay

## 2022-11-04 ENCOUNTER — Other Ambulatory Visit (HOSPITAL_BASED_OUTPATIENT_CLINIC_OR_DEPARTMENT_OTHER): Payer: Self-pay | Admitting: Internal Medicine

## 2022-11-04 MED ORDER — ROSUVASTATIN CALCIUM 20 MG PO TABS
20.0000 mg | ORAL_TABLET | Freq: Every day | ORAL | 3 refills | Status: DC
  Filled 2022-11-04: qty 90, 90d supply, fill #0
  Filled 2022-12-09 – 2023-02-13 (×3): qty 90, 90d supply, fill #1
  Filled 2023-05-13: qty 90, 90d supply, fill #2
  Filled 2023-08-13: qty 90, 90d supply, fill #3

## 2022-12-09 ENCOUNTER — Other Ambulatory Visit (HOSPITAL_COMMUNITY): Payer: Self-pay

## 2022-12-10 ENCOUNTER — Other Ambulatory Visit: Payer: Self-pay

## 2022-12-10 ENCOUNTER — Other Ambulatory Visit (HOSPITAL_COMMUNITY): Payer: Self-pay

## 2022-12-12 ENCOUNTER — Other Ambulatory Visit (HOSPITAL_COMMUNITY): Payer: Self-pay

## 2022-12-25 ENCOUNTER — Encounter: Payer: Self-pay | Admitting: Family Medicine

## 2022-12-25 ENCOUNTER — Ambulatory Visit (INDEPENDENT_AMBULATORY_CARE_PROVIDER_SITE_OTHER): Payer: BC Managed Care – PPO | Admitting: Family Medicine

## 2022-12-25 VITALS — BP 108/56 | HR 77 | Ht 67.0 in | Wt 211.0 lb

## 2022-12-25 DIAGNOSIS — B009 Herpesviral infection, unspecified: Secondary | ICD-10-CM

## 2022-12-25 DIAGNOSIS — Z01419 Encounter for gynecological examination (general) (routine) without abnormal findings: Secondary | ICD-10-CM | POA: Diagnosis not present

## 2022-12-25 DIAGNOSIS — N809 Endometriosis, unspecified: Secondary | ICD-10-CM

## 2022-12-25 NOTE — Progress Notes (Signed)
ANNUAL EXAM Patient name: Bonnie Dickson MRN [id number]  Date of birth: 05-Apr-1971 Chief Complaint:   Annual Exam  History of Present Illness:   Shanetra Blumenstock is a 52 y.o.  No obstetric history on file.  female  being seen today for a routine annual exam.  Current complaints: none  No LMP recorded. Patient has had a hysterectomy. Hysterectomy was approximately 15 years ago for dysmenorrhea and AUB-HMB. Had abnormal PAP in early 20s, but normal PAPs for 10+ years after that.  PCP did vaginal cuff PAP - ASCUS -HPV. Had trichomonas at that time.   Last mammogram: 08/2022. Results were: normal. Family h/o breast cancer: no Last colonoscopy:      08/07/2022    2:57 PM 04/29/2022    3:35 PM 05/23/2021   10:20 AM 11/20/2020   10:07 AM  Depression screen PHQ 2/9  Decreased Interest 0 0 0 0  Down, Depressed, Hopeless 0 0 0 0  PHQ - 2 Score 0 0 0 0  Altered sleeping 0 0  0  Tired, decreased energy 0 0  0  Change in appetite 0 0  0  Feeling bad or failure about yourself  0 0  0  Trouble concentrating 0 0  0  Moving slowly or fidgety/restless 0 0  0  Suicidal thoughts 0 0  0  PHQ-9 Score 0 0  0  Difficult doing work/chores Not difficult at all Not difficult at all           No data to display           Review of Systems:   Pertinent items are noted in HPI Denies any headaches, blurred vision, fatigue, shortness of breath, chest pain, abdominal pain, abnormal vaginal discharge/itching/odor/irritation, problems with periods, bowel movements, urination, or intercourse unless otherwise stated above. Pertinent History Reviewed:  Reviewed past medical,surgical, social and family history.  Reviewed problem list, medications and allergies. Physical Assessment:   Vitals:   12/25/22 1509  BP: (!) 108/56  Pulse: 77  Weight: 211 lb (95.7 kg)  Height: 5\' 7"  (1.702 m)  Body mass index is 33.05 kg/m.        Physical Examination:   General appearance - well appearing, and in no  distress  Mental status - alert, oriented to person, place, and time  Psych:  She has a normal mood and affect  Skin - warm and dry, normal color, no suspicious lesions noted  Chest - effort normal, all lung fields clear to auscultation bilaterally  Heart - normal rate and regular rhythm  Neck:  midline trachea, no thyromegaly or nodules  Breasts - breasts appear normal, no suspicious masses, no skin or nipple changes or axillary nodes  Abdomen - soft, nontender, nondistended, no masses or organomegaly  Pelvic - VULVA: normal appearing vulva with no masses, tenderness or lesions  VAGINA: normal appearing vagina with normal color and discharge, no lesions    UTERUS: absent  ADNEXA: No adnexal masses or tenderness noted.  Extremities:  No swelling or varicosities noted  Chaperone present for exam  Assessment & Plan:  1. Well woman exam with routine gynecological exam No vasomotor symptoms. Mild vaginal dryness.  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 Endometriosis No pelvic pain currently.    No orders of the defined types were placed in this encounter.   Meds: No orders of the defined types were placed in this encounter.   Follow-up: No follow-ups on file.  Tanna Savoy  Lakyn Mantione, DO 12/25/2022 3:23 PM

## 2023-01-11 ENCOUNTER — Other Ambulatory Visit: Payer: Self-pay | Admitting: Internal Medicine

## 2023-01-11 ENCOUNTER — Other Ambulatory Visit: Payer: Self-pay | Admitting: Family Medicine

## 2023-01-12 ENCOUNTER — Other Ambulatory Visit: Payer: Self-pay

## 2023-01-12 ENCOUNTER — Other Ambulatory Visit (HOSPITAL_COMMUNITY): Payer: Self-pay

## 2023-01-12 MED ORDER — AMLODIPINE BESYLATE 10 MG PO TABS
10.0000 mg | ORAL_TABLET | Freq: Every day | ORAL | 2 refills | Status: DC
  Filled 2023-01-12: qty 90, 90d supply, fill #0
  Filled 2023-04-15: qty 90, 90d supply, fill #1
  Filled 2023-08-13: qty 90, 90d supply, fill #2

## 2023-01-12 MED ORDER — OZEMPIC (2 MG/DOSE) 8 MG/3ML ~~LOC~~ SOPN
2.0000 mg | PEN_INJECTOR | SUBCUTANEOUS | 3 refills | Status: DC
  Filled 2023-01-12: qty 3, 28d supply, fill #0
  Filled 2023-02-09 – 2023-02-23 (×3): qty 3, 28d supply, fill #1
  Filled 2023-03-15 – 2023-03-19 (×3): qty 3, 28d supply, fill #2
  Filled 2023-04-15: qty 3, 28d supply, fill #3

## 2023-01-12 MED ORDER — CARVEDILOL 6.25 MG PO TABS
6.2500 mg | ORAL_TABLET | Freq: Two times a day (BID) | ORAL | 6 refills | Status: DC
  Filled 2023-01-12: qty 60, 30d supply, fill #0
  Filled 2023-02-17: qty 60, 30d supply, fill #1
  Filled 2023-03-15: qty 60, 30d supply, fill #2
  Filled 2023-04-15: qty 60, 30d supply, fill #3
  Filled 2023-05-13: qty 60, 30d supply, fill #4
  Filled 2023-07-08: qty 60, 30d supply, fill #5
  Filled 2023-08-13: qty 60, 30d supply, fill #6

## 2023-01-16 ENCOUNTER — Other Ambulatory Visit (HOSPITAL_COMMUNITY): Payer: Self-pay

## 2023-02-13 ENCOUNTER — Other Ambulatory Visit (HOSPITAL_COMMUNITY): Payer: Self-pay

## 2023-02-14 ENCOUNTER — Other Ambulatory Visit (HOSPITAL_COMMUNITY): Payer: Self-pay

## 2023-02-15 NOTE — Assessment & Plan Note (Signed)
Patient encouraged to maintain heart healthy diet, regular exercise, adequate sleep. Consider daily probiotics. Take medications as prescribed. Colonoscopy April 2023 repeat in 10 years. Pap 2021, no further paps required Mgm 9/23 repeat in 2024 Labs ordered and reviewed  Given and reviewed copy of ACP documents from [employer] and encouraged to complete and return

## 2023-02-15 NOTE — Assessment & Plan Note (Signed)
hgba1c acceptable, minimize simple carbs. Increase exercise as tolerated. Continue current meds, is doing well on Semaglutide but is having trouble accessing the patient discount coupon so is referred to pharmacy for assistance.

## 2023-02-15 NOTE — Assessment & Plan Note (Signed)
Tolerating statin, encouraged heart healthy diet, avoid trans fats, minimize simple carbs and saturated fats. Increase exercise as tolerated 

## 2023-02-16 ENCOUNTER — Other Ambulatory Visit (HOSPITAL_COMMUNITY): Payer: Self-pay

## 2023-02-16 ENCOUNTER — Ambulatory Visit (INDEPENDENT_AMBULATORY_CARE_PROVIDER_SITE_OTHER): Payer: BC Managed Care – PPO | Admitting: Family Medicine

## 2023-02-16 ENCOUNTER — Ambulatory Visit: Payer: BC Managed Care – PPO

## 2023-02-16 ENCOUNTER — Other Ambulatory Visit (HOSPITAL_COMMUNITY)
Admission: RE | Admit: 2023-02-16 | Discharge: 2023-02-16 | Disposition: A | Discharge status: Home / Self Care | Payer: BC Managed Care – PPO | Source: Ambulatory Visit | Attending: Family Medicine | Admitting: Family Medicine

## 2023-02-16 VITALS — BP 143/81 | HR 68 | Wt 200.0 lb

## 2023-02-16 VITALS — BP 116/70 | HR 70 | Temp 98.0°F | Resp 16 | Ht 67.0 in | Wt 200.0 lb

## 2023-02-16 DIAGNOSIS — M25552 Pain in left hip: Secondary | ICD-10-CM

## 2023-02-16 DIAGNOSIS — Z0000 Encounter for general adult medical examination without abnormal findings: Secondary | ICD-10-CM

## 2023-02-16 DIAGNOSIS — Z0001 Encounter for general adult medical examination with abnormal findings: Secondary | ICD-10-CM | POA: Diagnosis not present

## 2023-02-16 DIAGNOSIS — E109 Type 1 diabetes mellitus without complications: Secondary | ICD-10-CM

## 2023-02-16 DIAGNOSIS — Z113 Encounter for screening for infections with a predominantly sexual mode of transmission: Secondary | ICD-10-CM | POA: Insufficient documentation

## 2023-02-16 DIAGNOSIS — F17200 Nicotine dependence, unspecified, uncomplicated: Secondary | ICD-10-CM

## 2023-02-16 DIAGNOSIS — R87610 Atypical squamous cells of undetermined significance on cytologic smear of cervix (ASC-US): Secondary | ICD-10-CM

## 2023-02-16 DIAGNOSIS — E785 Hyperlipidemia, unspecified: Secondary | ICD-10-CM | POA: Diagnosis not present

## 2023-02-16 NOTE — Assessment & Plan Note (Signed)
Has been seen by ob/gyn and decision made she does not need further screening unless she develops new symptoms

## 2023-02-16 NOTE — Progress Notes (Signed)
Subjective:    Patient ID: Bonnie Dickson, female    DOB: Dec 26, 1970, 52 y.o.   MRN: [id number]  Chief Complaint  Patient presents with   Annual Exam    Annual Exam    HPI Patient is in today for follow up on chronic medical concerns and annual preventative exam. No recent febrile illness or hospitalizations. Denies CP/palp/SOB/HA/congestion/fevers/GI or GU c/o. Taking meds as prescribed. 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 She tries to maintain a heart healthy diet and has been following a mediterranean platform. She gets adequate sleep and hydration. Stays active. She is noting last week just brushing her hair her back locked up in the lumbar area with pain radiating to left hip and down left leg. No incontinence or falls. Is much better today and responded to heat and muscle relaxers.   Past Medical History:  Diagnosis Date   Arthritis    lower back   Blood transfusion without reported diagnosis    CAD (coronary artery disease)    a. NSTEMI 03/2021 s/p DES to LAD.   Depression    situational   Diabetes mellitus without complication ([place])    Endometriosis    GERD (gastroesophageal reflux disease)    History of chicken pox    HSV-2 (herpes simplex virus 2) infection    Hyperglycemia 07/24/2019   Hyperlipidemia    Hypertension    Insomnia    Myocardial infarction ([place]) 03/31/2021   Snoring    Tonsillitis and adenoiditis, chronic    childhood, caused adult snoring    Past Surgical History:  Procedure Laterality Date   ABDOMINAL HYSTERECTOMY     ovaries left in place   BREAST CYST EXCISION Right    35 years ago    COMBINED ABDOMINOPLASTY AND LIPOSUCTION     CORONARY STENT INTERVENTION N/A 04/01/2021   Procedure: CORONARY STENT INTERVENTION;  Surgeon: Leonie Man, MD;  Location: [facility];  Service: Cardiovascular;  Laterality: N/A;   ENDOMETRIAL ABLATION     ENDOMETRIAL BIOPSY     LAPAROSCOPIC ENDOMETRIOSIS FULGURATION      LEFT HEART CATH AND CORONARY ANGIOGRAPHY N/A 04/01/2021   Procedure: LEFT HEART CATH AND CORONARY ANGIOGRAPHY;  Surgeon: Leonie Man, MD;  Location: [facility];  Service: Cardiovascular;  Laterality: N/A;   PARTIAL HYSTERECTOMY     vaginal   TUBAL LIGATION      Family History  Problem Relation Age of Onset   Hypertension Mother    Diabetes Mother        prediabetes   Hyperlipidemia Mother    Diabetes Father    Cataracts Father    Eczema Brother    Asthma Brother    Diabetes Maternal Grandmother    Hypertension Maternal Grandmother    Diabetes Maternal Grandfather    Hypertension Maternal Grandfather    Hyperlipidemia Maternal Grandfather    Heart failure Maternal Grandfather    COPD Maternal Grandfather    ODD Son    ADD / ADHD Son    Diabetes Daughter    Hypertension Daughter    Colon cancer Neg Hx    Stomach cancer Neg Hx    Pancreatic cancer Neg Hx    Esophageal cancer Neg Hx     Social History   Socioeconomic History   Marital status: Widowed    Spouse name: Not on file   Number of children: Not on file   Years of education: Not on  file   Highest education level: Not on file  Occupational History   Not on file  Tobacco Use   Smoking status: Former    Packs/day: 0.50    Years: 30.00    Additional pack years: 0.00    Total pack years: 15.00    Types: Cigarettes    Quit date: 10/31/2021    Years since quitting: 1.2   Smokeless tobacco: Never  Vaping Use   Vaping Use: Never used  Substance and Sexual Activity   Alcohol use: Never   Drug use: Never   Sexual activity: Not Currently    Birth control/protection: Surgical  Other Topics Concern   Not on file  Social History Narrative   Not on file   Social Determinants of Health   Financial Resource Strain: Not on file  Food Insecurity: Not on file  Transportation Needs: Not on file  Physical Activity: Not on file  Stress: Not on file  Social Connections: Not on file  Intimate Partner  Violence: Not on file    Outpatient Medications Prior to Visit  Medication Sig Dispense Refill   amLODipine (NORVASC) 10 MG tablet Take 1 tablet (10 mg total) by mouth daily. 90 tablet 2   aspirin EC (ASPIRIN LOW DOSE) 81 MG tablet Take 1 tablet (81 mg total) by mouth daily. Swallow whole. 30 tablet 11   carvedilol (COREG) 6.25 MG tablet Take 1 tablet (6.25 mg total) by mouth 2 (two) times daily with a meal. 60 tablet 6   insulin aspart (NOVOLOG) 100 UNIT/ML injection      insulin glargine (LANTUS SOLOSTAR) 100 UNIT/ML Solostar Pen Inject 20 Units into the skin daily. 15 mL 6   insulin lispro (HUMALOG KWIKPEN) 100 UNIT/ML KwikPen Max 30 Units daily per correction scale 6 mL 0   Insulin Pen Needle 32G X 4 MM MISC Use as directed with Humalog up to 4 times a day; in the morning, at noon, in the evening, and at bedtime. 400 each 2   linaclotide (LINZESS) 145 MCG CAPS capsule Take 1 capsule (145 mcg total) by mouth daily before breakfast. 90 capsule 3   nitroGLYCERIN (NITROSTAT) 0.4 MG SL tablet Place 1 tablet (0.4 mg total) under the tongue every 5 (five) minutes as needed for chest pain (up to 3 doses). 25 tablet 3   rosuvastatin (CRESTOR) 20 MG tablet Take 1 tablet (20 mg total) by mouth daily. 90 tablet 3   Semaglutide, 2 MG/DOSE, (OZEMPIC, 2 MG/DOSE,) 8 MG/3ML SOPN Inject 2 mg into the skin once a week. 3 mL 3   tiZANidine (ZANAFLEX) 2 MG tablet Take 0.5-2 tablets (1-4 mg total) by mouth 2 (two) times daily as needed for muscle spasms. 40 tablet 1   valACYclovir (VALTREX) 500 MG tablet Take 1 tablet (500 mg total) by mouth 2 (two) times daily. 180 tablet 1   varenicline (CHANTIX CONTINUING MONTH PAK) 1 MG tablet Take 1 tablet (1 mg total) by mouth 2 (two) times daily. 60 tablet 5   Semaglutide, 1 MG/DOSE, 4 MG/3ML SOPN Inject 1 mg as directed once a week. 3 mL 0   No facility-administered medications prior to visit.    Allergies  Allergen Reactions   Atorvastatin     Other reaction(s):  myalgias (muscle pain)   Ciprofloxacin Hcl Anaphylaxis   Gentamicin Anaphylaxis   Penicillins Anaphylaxis   Ambien [Zolpidem Tartrate] Other (See Comments)    Sleep walking    Asa [Aspirin] Other (See Comments)    Rectal bleeding  Banana Itching, Swelling and Other (See Comments)    Tongue itches and swells- breathing not impaired   Ivp Dye [Iodinated Contrast Media] Nausea And Vomiting and Other (See Comments)    Severe vomiting    Lisinopril Cough   Metformin And Related Nausea Only and Other (See Comments)    Severe GI upset   Nsaids Other (See Comments)    Rectal bleeding    Pineapple Other (See Comments)    Causes a burning sensation in the mouth   Tape Other (See Comments)    Paper tape "burns the skin"   Codeine Rash   Flagyl [Metronidazole] Rash   Morphine And Related Rash    Review of Systems  Constitutional:  Positive for malaise/fatigue. Negative for chills and fever.  HENT:  Negative for congestion and hearing loss.   Eyes:  Negative for discharge.  Respiratory:  Negative for cough, sputum production and shortness of breath.   Cardiovascular:  Negative for chest pain, palpitations and leg swelling.  Gastrointestinal:  Negative for abdominal pain, blood in stool, constipation, diarrhea, heartburn, nausea and vomiting.  Genitourinary:  Negative for dysuria, frequency, hematuria and urgency.  Musculoskeletal:  Positive for back pain and joint pain. Negative for falls and myalgias.  Skin:  Negative for rash.  Neurological:  Negative for dizziness, sensory change, loss of consciousness, weakness and headaches.  Endo/Heme/Allergies:  Negative for environmental allergies. Does not bruise/bleed easily.  Psychiatric/Behavioral:  Negative for depression and suicidal ideas. The patient is not nervous/anxious and does not have insomnia.        Objective:    Physical Exam Constitutional:      General: She is not in acute distress.    Appearance: Normal appearance.  She is not diaphoretic.  HENT:     Head: Normocephalic and atraumatic.     Right Ear: Tympanic membrane, ear canal and external ear normal.     Left Ear: Tympanic membrane, ear canal and external ear normal.     Nose: Nose normal.     Mouth/Throat:     Mouth: Mucous membranes are moist.     Pharynx: Oropharynx is clear. No oropharyngeal exudate.  Eyes:     General: No scleral icterus.       Right eye: No discharge.        Left eye: No discharge.     Conjunctiva/sclera: Conjunctivae normal.     Pupils: Pupils are equal, round, and reactive to light.  Neck:     Thyroid: No thyromegaly.  Cardiovascular:     Rate and Rhythm: Normal rate and regular rhythm.     Heart sounds: Normal heart sounds. No murmur heard. Pulmonary:     Effort: Pulmonary effort is normal. No respiratory distress.     Breath sounds: Normal breath sounds. No wheezing or rales.  Abdominal:     General: Bowel sounds are normal. There is no distension.     Palpations: Abdomen is soft. There is no mass.     Tenderness: There is no abdominal tenderness.  Musculoskeletal:        General: No tenderness. Normal range of motion.     Cervical back: Normal range of motion and neck supple.  Lymphadenopathy:     Cervical: No cervical adenopathy.  Skin:    General: Skin is warm and dry.     Findings: No rash.  Neurological:     General: No focal deficit present.     Mental Status: She is alert and oriented to person,  place, and time.     Cranial Nerves: No cranial nerve deficit.     Coordination: Coordination normal.     Deep Tendon Reflexes: Reflexes are normal and symmetric. Reflexes normal.  Psychiatric:        Mood and Affect: Mood normal.        Behavior: Behavior normal.        Thought Content: Thought content normal.        Judgment: Judgment normal.     BP 116/70 (BP Location: Right Arm, Patient Position: Sitting, Cuff Size: Normal)   Pulse 70   Temp 98 F (36.7 C) (Oral)   Resp 16   Ht 5\' 7"  (1.702 m)    Wt 200 lb (90.7 kg)   SpO2 97%   BMI 31.32 kg/m  Wt Readings from Last 3 Encounters:  02/16/23 200 lb (90.7 kg)  02/16/23 200 lb (90.7 kg)  12/25/22 211 lb (95.7 kg)    Diabetic Foot Exam - Simple   No data filed    Lab Results  Component Value Date   WBC 7.7 08/07/2022   HGB 13.8 08/07/2022   HCT 42.1 08/07/2022   PLT 359.0 08/07/2022   GLUCOSE 107 (H) 08/07/2022   CHOL 113 08/07/2022   TRIG 97.0 08/07/2022   HDL 34.10 (L) 08/07/2022   LDLCALC 60 08/07/2022   ALT 15 08/07/2022   AST 15 08/07/2022   NA 138 08/07/2022   K 3.5 08/07/2022   CL 102 08/07/2022   CREATININE 0.71 08/07/2022   BUN 7 08/07/2022   CO2 29 08/07/2022   TSH 1.32 08/07/2022   INR 0.9 03/31/2021   HGBA1C 8.6 (H) 08/07/2022    Lab Results  Component Value Date   TSH 1.32 08/07/2022   Lab Results  Component Value Date   WBC 7.7 08/07/2022   HGB 13.8 08/07/2022   HCT 42.1 08/07/2022   MCV 93.9 08/07/2022   PLT 359.0 08/07/2022   Lab Results  Component Value Date   NA 138 08/07/2022   K 3.5 08/07/2022   CO2 29 08/07/2022   GLUCOSE 107 (H) 08/07/2022   BUN 7 08/07/2022   CREATININE 0.71 08/07/2022   BILITOT 0.4 08/07/2022   ALKPHOS 90 08/07/2022   AST 15 08/07/2022   ALT 15 08/07/2022   PROT 7.9 08/07/2022   ALBUMIN 4.2 08/07/2022   CALCIUM 9.6 08/07/2022   ANIONGAP 10 04/02/2021   GFR 99.22 08/07/2022   Lab Results  Component Value Date   CHOL 113 08/07/2022   Lab Results  Component Value Date   HDL 34.10 (L) 08/07/2022   Lab Results  Component Value Date   LDLCALC 60 08/07/2022   Lab Results  Component Value Date   TRIG 97.0 08/07/2022   Lab Results  Component Value Date   CHOLHDL 3 08/07/2022   Lab Results  Component Value Date   HGBA1C 8.6 (H) 08/07/2022       Assessment & Plan:  Diabetes mellitus, labile ([place]) Assessment & Plan: hgba1c acceptable, minimize simple carbs. Increase exercise as tolerated. Continue current meds, is doing well on  Semaglutide but is having trouble accessing the patient discount coupon so is referred to pharmacy for assistance.  Orders: -     AMB Referral to Pharmacy Medication Management -     Hemoglobin A1c -     Comprehensive metabolic panel -     TSH  Hyperlipidemia LDL goal <70 Assessment & Plan: Tolerating statin, encouraged heart healthy diet, avoid trans fats, minimize  simple carbs and saturated fats. Increase exercise as tolerated   Orders: -     Lipid panel -     TSH  Preventative health care Assessment & Plan: Patient encouraged to maintain heart healthy diet, regular exercise, adequate sleep. Consider daily probiotics. Take medications as prescribed. Colonoscopy April 2023 repeat in 10 years. Pap 2021, no further paps required Mgm 9/23 repeat in 2024 Labs ordered and reviewed  Given and reviewed copy of ACP documents from [employer] and encouraged to complete and return    Left hip pain Assessment & Plan: A week ago today her left posterior hip and left leg pain returned. She had to use a cane for a few days but now she is much better. The muscle relaxer and heat is helpful. She will let us know if she wants a referral to physical therapy  Orders: -     CBC with Differential/Platelet  Tobacco dependence Assessment & Plan: Needs complete cessation   Pap smear abnormality of cervix with ASCUS favoring benign Assessment & Plan: Has been seen by ob/gyn and decision made she does not need further screening unless she develops new symptoms     Penni Homans, MD

## 2023-02-16 NOTE — Progress Notes (Signed)
Patient presents for STI screening.  Bonnie Dickson l Marquize Seib, CMA  

## 2023-02-16 NOTE — Assessment & Plan Note (Signed)
A week ago today her left posterior hip and left leg pain returned. She had to use a cane for a few days but now she is much better. The muscle relaxer and heat is helpful. She will let us know if she wants a referral to physical therapy

## 2023-02-16 NOTE — Assessment & Plan Note (Signed)
Needs complete cessation 

## 2023-02-16 NOTE — Patient Instructions (Addendum)
6-8 hours of sleep 60-80 ounces of clear fluids daily 4000 steps, 8000 steps MIND or PURE diets are variants of mediterranean  Shingrix is the new shingles shot, 2 shots over 2-6 months, confirm coverage with insurance and document, then can return here for shots with nurse appt or at pharmacy    Preventive Care 28-51 Years Old, Female Preventive care refers to lifestyle choices and visits with your health care provider that can promote health and wellness. Preventive care visits are also called wellness exams. What can I expect for my preventive care visit? Counseling Your health care provider may ask you questions about your: Medical history, including: Past medical problems. Family medical history. Pregnancy history. Current health, including: Menstrual cycle. Method of birth control. Emotional well-being. Home life and relationship well-being. Sexual activity and sexual health. Lifestyle, including: Alcohol, nicotine or tobacco, and drug use. Access to firearms. Diet, exercise, and sleep habits. Work and work Statistician. Sunscreen use. Safety issues such as seatbelt and bike helmet use. Physical exam Your health care provider will check your: Height and weight. These may be used to calculate your BMI (body mass index). BMI is a measurement that tells if you are at a healthy weight. Waist circumference. This measures the distance around your waistline. This measurement also tells if you are at a healthy weight and may help predict your risk of certain diseases, such as type 2 diabetes and high blood pressure. Heart rate and blood pressure. Body temperature. Skin for abnormal spots. What immunizations do I need?  Vaccines are usually given at various ages, according to a schedule. Your health care provider will recommend vaccines for you based on your age, medical history, and lifestyle or other factors, such as travel or where you work. What tests do I  need? Screening Your health care provider may recommend screening tests for certain conditions. This may include: Lipid and cholesterol levels. Diabetes screening. This is done by checking your blood sugar (glucose) after you have not eaten for a while (fasting). Pelvic exam and Pap test. Hepatitis B test. Hepatitis C test. HIV (human immunodeficiency virus) test. STI (sexually transmitted infection) testing, if you are at risk. Lung cancer screening. Colorectal cancer screening. Mammogram. Talk with your health care provider about when you should start having regular mammograms. This may depend on whether you have a family history of breast cancer. BRCA-related cancer screening. This may be done if you have a family history of breast, ovarian, tubal, or peritoneal cancers. Bone density scan. This is done to screen for osteoporosis. Talk with your health care provider about your test results, treatment options, and if necessary, the need for more tests. Follow these instructions at home: Eating and drinking  Eat a diet that includes fresh fruits and vegetables, whole grains, lean protein, and low-fat dairy products. Take vitamin and mineral supplements as recommended by your health care provider. Do not drink alcohol if: Your health care provider tells you not to drink. You are pregnant, may be pregnant, or are planning to become pregnant. If you drink alcohol: Limit how much you have to 0-1 drink a day. Know how much alcohol is in your drink. In the [place] one drink equals one 12 oz bottle of beer (355 mL), one 5 oz glass of wine (148 mL), or one 1 oz glass of hard liquor (44 mL). Lifestyle Brush your teeth every morning and night with fluoride toothpaste. Floss one time each day. Exercise for at least 30 minutes 5 or more days  each week. Do not use any products that contain nicotine or tobacco. These products include cigarettes, chewing tobacco, and vaping devices, such as  e-cigarettes. If you need help quitting, ask your health care provider. Do not use drugs. If you are sexually active, practice safe sex. Use a condom or other form of protection to prevent STIs. If you do not wish to become pregnant, use a form of birth control. If you plan to become pregnant, see your health care provider for a prepregnancy visit. Take aspirin only as told by your health care provider. Make sure that you understand how much to take and what form to take. Work with your health care provider to find out whether it is safe and beneficial for you to take aspirin daily. Find healthy ways to manage stress, such as: Meditation, yoga, or listening to music. Journaling. Talking to a trusted person. Spending time with friends and family. Minimize exposure to UV radiation to reduce your risk of skin cancer. Safety Always wear your seat belt while driving or riding in a vehicle. Do not drive: If you have been drinking alcohol. Do not ride with someone who has been drinking. When you are tired or distracted. While texting. If you have been using any mind-altering substances or drugs. Wear a helmet and other protective equipment during sports activities. If you have firearms in your house, make sure you follow all gun safety procedures. Seek help if you have been physically or sexually abused. What's next? Visit your health care provider once a year for an annual wellness visit. Ask your health care provider how often you should have your eyes and teeth checked. Stay up to date on all vaccines. This information is not intended to replace advice given to you by your health care provider. Make sure you discuss any questions you have with your health care provider. Document Revised: 05/15/2021 Document Reviewed: 05/15/2021 Elsevier Patient Education  [place].

## 2023-02-17 ENCOUNTER — Telehealth: Payer: Self-pay

## 2023-02-17 ENCOUNTER — Other Ambulatory Visit: Payer: Self-pay

## 2023-02-17 ENCOUNTER — Other Ambulatory Visit: Payer: Self-pay | Admitting: Family Medicine

## 2023-02-17 LAB — CBC WITH DIFFERENTIAL/PLATELET
Basophils Absolute: 0.1 10*3/uL (ref 0.0–0.1)
Basophils Relative: 1.2 % (ref 0.0–3.0)
Eosinophils Absolute: 0.2 10*3/uL (ref 0.0–0.7)
Eosinophils Relative: 2.6 % (ref 0.0–5.0)
HCT: 43.1 % (ref 36.0–46.0)
Hemoglobin: 14.2 g/dL (ref 12.0–15.0)
Lymphocytes Relative: 35.4 % (ref 12.0–46.0)
Lymphs Abs: 2.9 10*3/uL (ref 0.7–4.0)
MCHC: 32.9 g/dL (ref 30.0–36.0)
MCV: 96.7 fl (ref 78.0–100.0)
Monocytes Absolute: 0.8 10*3/uL (ref 0.1–1.0)
Monocytes Relative: 9.3 % (ref 3.0–12.0)
Neutro Abs: 4.2 10*3/uL (ref 1.4–7.7)
Neutrophils Relative %: 51.5 % (ref 43.0–77.0)
Platelets: 382 10*3/uL (ref 150.0–400.0)
RBC: 4.46 Mil/uL (ref 3.87–5.11)
RDW: 14 % (ref 11.5–15.5)
WBC: 8.2 10*3/uL (ref 4.0–10.5)

## 2023-02-17 LAB — RPR: RPR Ser Ql: NONREACTIVE

## 2023-02-17 LAB — HIV ANTIBODY (ROUTINE TESTING W REFLEX): HIV Screen 4th Generation wRfx: NONREACTIVE

## 2023-02-17 LAB — COMPREHENSIVE METABOLIC PANEL
ALT: 14 U/L (ref 0–35)
AST: 13 U/L (ref 0–37)
Albumin: 4.2 g/dL (ref 3.5–5.2)
Alkaline Phosphatase: 78 U/L (ref 39–117)
BUN: 12 mg/dL (ref 6–23)
CO2: 24 mEq/L (ref 19–32)
Calcium: 9.9 mg/dL (ref 8.4–10.5)
Chloride: 102 mEq/L (ref 96–112)
Creatinine, Ser: 0.61 mg/dL (ref 0.40–1.20)
GFR: 103.95 mL/min (ref >60.00)
Glucose, Bld: 102 mg/dL — ABNORMAL HIGH (ref 70–99)
Potassium: 4.1 mEq/L (ref 3.5–5.1)
Sodium: 136 mEq/L (ref 135–145)
Total Bilirubin: 0.5 mg/dL (ref 0.2–1.2)
Total Protein: 7.4 g/dL (ref 6.0–8.3)

## 2023-02-17 LAB — LIPID PANEL
Cholesterol: 128 mg/dL (ref 0–200)
HDL: 39.7 mg/dL (ref >39.00)
LDL Cholesterol: 66 mg/dL (ref 0–99)
NonHDL: 87.8
Total CHOL/HDL Ratio: 3
Triglycerides: 108 mg/dL (ref 0.0–149.0)
VLDL: 21.6 mg/dL (ref 0.0–40.0)

## 2023-02-17 LAB — HEPATITIS B SURFACE ANTIGEN: Hepatitis B Surface Ag: NEGATIVE

## 2023-02-17 LAB — HEPATITIS C ANTIBODY: Hep C Virus Ab: NONREACTIVE

## 2023-02-17 LAB — TSH: TSH: 1.89 u[IU]/mL (ref 0.35–5.50)

## 2023-02-17 LAB — HEMOGLOBIN A1C: Hgb A1c MFr Bld: 7.1 % — ABNORMAL HIGH (ref 4.6–6.5)

## 2023-02-17 NOTE — Progress Notes (Signed)
  Care Coordination  Note  02/17/2023 Name: [name] MRN: [id number] DOB: 1971/06/11  Bonnie Dickson is a 52 y.o. year old female who is a primary care patient of Mosie Lukes, MD. I reached out to [name] by phone today to offer care coordination services.      Ms. Mulnix was given information about Care Coordination services today including:  The Care Coordination services include support from the care team which includes your Nurse Coordinator, Clinical Social Worker, or Pharmacist.  The Care Coordination team is here to help remove barriers to the health concerns and goals most important to you. Care Coordination services are voluntary and the patient may decline or stop services at any time by request to their care team member.   Patient agreed to services and verbal consent obtained.   Follow up plan: Telephone appointment with care coordination team member scheduled for:02/23/2023  Noreene Larsson, [place], [place] [address] Direct Dial: [phone] [email or web address]

## 2023-02-17 NOTE — Progress Notes (Signed)
  Care Management   Outreach Note  02/17/2023 Name: Bonnie Dickson MRN: [id number] DOB: 07/04/1971  An unsuccessful telephone outreach was attempted today to contact the patient about Chronic Care Management needs.    Follow Up Plan:  A HIPAA compliant phone message was left for the patient providing contact information and requesting a return call.  The care management team will reach out to the patient again over the next 7 days.  If patient returns call to provider office, please advise to call [place] * at [phone] Larsson, [place], [place] [address] Direct Dial: [phone] [email or web address]

## 2023-02-18 LAB — CERVICOVAGINAL ANCILLARY ONLY
Chlamydia: NEGATIVE
Comment: NEGATIVE
Comment: NEGATIVE
Comment: NORMAL
Neisseria Gonorrhea: NEGATIVE
Trichomonas: NEGATIVE

## 2023-02-19 ENCOUNTER — Other Ambulatory Visit (HOSPITAL_COMMUNITY): Payer: Self-pay

## 2023-02-20 ENCOUNTER — Other Ambulatory Visit: Payer: Self-pay | Admitting: Family Medicine

## 2023-02-20 ENCOUNTER — Other Ambulatory Visit (HOSPITAL_COMMUNITY): Payer: Self-pay

## 2023-02-20 MED ORDER — VARENICLINE TARTRATE 1 MG PO TABS
1.0000 mg | ORAL_TABLET | Freq: Two times a day (BID) | ORAL | 5 refills | Status: DC
  Filled 2023-02-20: qty 60, 30d supply, fill #0
  Filled 2023-03-19: qty 60, 30d supply, fill #1
  Filled 2023-04-30: qty 112, 56d supply, fill #2
  Filled 2023-05-13: qty 60, 30d supply, fill #2
  Filled 2023-06-07 – 2023-07-08 (×2): qty 60, 30d supply, fill #3

## 2023-02-23 ENCOUNTER — Encounter: Payer: Self-pay | Admitting: Pharmacist

## 2023-02-23 ENCOUNTER — Ambulatory Visit (INDEPENDENT_AMBULATORY_CARE_PROVIDER_SITE_OTHER): Payer: BC Managed Care – PPO | Admitting: Pharmacist

## 2023-02-23 ENCOUNTER — Other Ambulatory Visit (HOSPITAL_COMMUNITY): Payer: Self-pay

## 2023-02-23 DIAGNOSIS — E109 Type 1 diabetes mellitus without complications: Secondary | ICD-10-CM

## 2023-02-23 MED ORDER — LINACLOTIDE 145 MCG PO CAPS
145.0000 ug | ORAL_CAPSULE | Freq: Every day | ORAL | 0 refills | Status: DC
  Filled 2023-02-23: qty 30, 30d supply, fill #0

## 2023-02-23 NOTE — Progress Notes (Signed)
02/23/2023 Name: Bonnie Dickson MRN: [id number] DOB: 29-Apr-1971  Chief Complaint  Patient presents with   Medication Management    Initial    Bonnie Dickson is a 52 y.o. year old female who presented for a telephone visit.   They were referred to the pharmacist by their PCP for assistance in managing medication access.    Subjective:  Care Team: Primary Care Provider: Mosie Lukes, MD ; Next Scheduled Visit: 08/24/2023  Medication Access/Adherence  Current Pharmacy:  [facility] [address] [place] [address] Phone: [phone] Fax: [phone]   Patient reports affordability concerns with their medications: Yes  Patient reports access/transportation concerns to their pharmacy: No  Patient reports adherence concerns with their medications:  No    Diabetes:  Current medications:  Ozempic 2mg  weekly Lantus 20 units daily, Humalog and Novolog on her med list but patient reports she has not needed to take insulin for the last several months.   Current glucose readings: 100 to 140 Using fingerstick glucometer currently but has used DexCom Continuous Glucose Monitor but cannot afford until she reaches her insurance deductible.    Objective:  Lab Results  Component Value Date   HGBA1C 7.1 (H) 02/16/2023    Lab Results  Component Value Date   CREATININE 0.61 02/16/2023   BUN 12 02/16/2023   NA 136 02/16/2023   K 4.1 02/16/2023   CL 102 02/16/2023   CO2 24 02/16/2023    Lab Results  Component Value Date   CHOL 128 02/16/2023   HDL 39.70 02/16/2023   LDLCALC 66 02/16/2023   TRIG 108.0 02/16/2023   CHOLHDL 3 02/16/2023    Medications Reviewed Today     Reviewed by Cherre Robins, RPH-CPP (Pharmacist) on 02/23/23 at 28  Med List Status: <None>   Medication Order Taking? Sig Documenting Provider Last Dose Status Informant  amLODipine (NORVASC) 10 MG tablet [id number]  Take 1 tablet (10 mg total) by  mouth daily. Pixie Casino, MD  Active   aspirin EC (ASPIRIN LOW DOSE) 81 MG tablet [id number]  Take 1 tablet (81 mg total) by mouth daily. Swallow whole. Mosie Lukes, MD  Active   carvedilol (COREG) 6.25 MG tablet [id number]  Take 1 tablet (6.25 mg total) by mouth 2 (two) times daily with a meal. Pixie Casino, MD  Active     Patient not taking:  Discontinued 02/23/23 1437 (No longer needed (for PRN medications))   insulin glargine (LANTUS SOLOSTAR) 100 UNIT/ML Solostar Pen [id number] No Inject 20 Units into the skin daily.  Patient not taking: Reported on 02/23/2023   Shamleffer, Melanie Crazier, MD Not Taking Active   insulin lispro (HUMALOG KWIKPEN) 100 UNIT/ML KwikPen [id number] No Max 30 Units daily per correction scale  Patient not taking: Reported on 02/23/2023   Shamleffer, Melanie Crazier, MD Not Taking Active   Insulin Pen Needle 32G X 4 MM MISC [id number] Yes Use as directed with Humalog up to 4 times a day; in the morning, at noon, in the evening, and at bedtime. Shamleffer, Melanie Crazier, MD Taking Active   linaclotide [facility]) 145 MCG CAPS capsule [id number]  Take 1 capsule (145 mcg total) by mouth daily before breakfast. Mosie Lukes, MD  Active   nitroGLYCERIN (NITROSTAT) 0.4 MG SL tablet [id number]  Place 1 tablet (0.4 mg total) under the tongue every 5 (five) minutes as needed for chest pain (up to 3 doses). Pixie Casino, MD  Active  rosuvastatin (CRESTOR) 20 MG tablet [id number]  Take 1 tablet (20 mg total) by mouth daily. Pixie Casino, MD  Active   Semaglutide, 2 MG/DOSE, (OZEMPIC, 2 MG/DOSE,) 8 MG/3ML SOPN [id number] Yes Inject 2 mg into the skin once a week. Mosie Lukes, MD Taking Active   tiZANidine (ZANAFLEX) 2 MG tablet [id number]  Take 0.5-2 tablets (1-4 mg total) by mouth 2 (two) times daily as needed for muscle spasms. Mosie Lukes, MD  Active   valACYclovir (VALTREX) 500 MG tablet [id number]  Take 1 tablet (500 mg total) by mouth 2 (two) times daily.  Mosie Lukes, MD  Active   varenicline (CHANTIX CONTINUING MONTH PAK) 1 MG tablet [id number]  Take 1 tablet (1 mg total) by mouth 2 (two) times daily. Mosie Lukes, MD  Active               Assessment/Plan:   Diabetes: Currently A1c not at goal of < 7.0% but close at 7.1% - Reviewed goal A1c, goal fasting, and goal 2 hour post prandial glucose - Updated med list - Assisted in getting new coupon for Ozempic. Coordinated with pharmacy - cost after new card applied was $33.73 per 28 days.  - Recommend to check glucose once daily  Patient also mentioned she takes Linzess prn for constipation related to Ozempic and IBS but has not taken as frequently due to cost of Linzess.  Assisted in getting coupon card for Linzess. BIN: [id number] / PCN: CN / [id number] / [id number].   Meds ordered this encounter  Medications   linaclotide (LINZESS) 145 MCG CAPS capsule    Sig: Take 1 capsule (145 mcg total) by mouth daily before breakfast.    Dispense:  90 capsule    Refill:  0     Cherre Robins, PharmD Clinical Pharmacist [facility] [place] [facility]

## 2023-02-24 ENCOUNTER — Other Ambulatory Visit (HOSPITAL_COMMUNITY): Payer: Self-pay

## 2023-02-27 ENCOUNTER — Other Ambulatory Visit (HOSPITAL_COMMUNITY): Payer: Self-pay

## 2023-03-16 ENCOUNTER — Other Ambulatory Visit (HOSPITAL_COMMUNITY): Payer: Self-pay

## 2023-03-16 ENCOUNTER — Other Ambulatory Visit: Payer: Self-pay

## 2023-03-18 ENCOUNTER — Other Ambulatory Visit (HOSPITAL_COMMUNITY): Payer: Self-pay

## 2023-03-19 ENCOUNTER — Other Ambulatory Visit (HOSPITAL_COMMUNITY): Payer: Self-pay

## 2023-03-20 ENCOUNTER — Other Ambulatory Visit (HOSPITAL_COMMUNITY): Payer: Self-pay

## 2023-04-15 ENCOUNTER — Other Ambulatory Visit: Payer: Self-pay

## 2023-04-15 ENCOUNTER — Other Ambulatory Visit: Payer: Self-pay | Admitting: Family Medicine

## 2023-04-15 ENCOUNTER — Other Ambulatory Visit (HOSPITAL_COMMUNITY): Payer: Self-pay

## 2023-04-15 MED ORDER — ASPIRIN 81 MG PO TBEC
81.0000 mg | DELAYED_RELEASE_TABLET | Freq: Every day | ORAL | 1 refills | Status: DC
  Filled 2023-04-15: qty 90, 90d supply, fill #0
  Filled 2023-08-13: qty 90, 90d supply, fill #1

## 2023-04-15 MED ORDER — VALACYCLOVIR HCL 500 MG PO TABS
500.0000 mg | ORAL_TABLET | Freq: Two times a day (BID) | ORAL | 1 refills | Status: DC
  Filled 2023-04-15: qty 180, 90d supply, fill #0
  Filled 2023-09-13: qty 180, 90d supply, fill #1

## 2023-04-16 ENCOUNTER — Other Ambulatory Visit (HOSPITAL_COMMUNITY): Payer: Self-pay

## 2023-04-30 ENCOUNTER — Other Ambulatory Visit (HOSPITAL_COMMUNITY): Payer: Self-pay

## 2023-05-13 ENCOUNTER — Other Ambulatory Visit: Payer: Self-pay | Admitting: Family Medicine

## 2023-05-14 ENCOUNTER — Other Ambulatory Visit (HOSPITAL_COMMUNITY): Payer: Self-pay

## 2023-05-14 ENCOUNTER — Other Ambulatory Visit: Payer: Self-pay

## 2023-05-14 MED ORDER — OZEMPIC (2 MG/DOSE) 8 MG/3ML ~~LOC~~ SOPN
2.0000 mg | PEN_INJECTOR | SUBCUTANEOUS | 3 refills | Status: DC
  Filled 2023-05-14: qty 3, 28d supply, fill #0
  Filled 2023-06-07 – 2023-07-08 (×2): qty 3, 28d supply, fill #1
  Filled 2023-08-13: qty 3, 28d supply, fill #2

## 2023-06-08 ENCOUNTER — Other Ambulatory Visit (HOSPITAL_COMMUNITY): Payer: Self-pay

## 2023-06-18 ENCOUNTER — Other Ambulatory Visit (HOSPITAL_COMMUNITY): Payer: Self-pay

## 2023-07-08 ENCOUNTER — Other Ambulatory Visit: Payer: Self-pay | Admitting: Internal Medicine

## 2023-07-09 ENCOUNTER — Other Ambulatory Visit (HOSPITAL_COMMUNITY): Payer: Self-pay

## 2023-07-09 ENCOUNTER — Other Ambulatory Visit: Payer: Self-pay

## 2023-07-09 MED ORDER — NITROGLYCERIN 0.4 MG SL SUBL
0.4000 mg | SUBLINGUAL_TABLET | SUBLINGUAL | 3 refills | Status: AC | PRN
  Filled 2023-07-09: qty 25, 1d supply, fill #0

## 2023-07-16 ENCOUNTER — Encounter (INDEPENDENT_AMBULATORY_CARE_PROVIDER_SITE_OTHER): Payer: Self-pay

## 2023-08-21 ENCOUNTER — Other Ambulatory Visit (HOSPITAL_COMMUNITY): Payer: Self-pay

## 2023-08-23 NOTE — Assessment & Plan Note (Signed)
monitor

## 2023-08-23 NOTE — Assessment & Plan Note (Addendum)
hgba1c acceptable, minimize simple carbs. Increase exercise as tolerated. Continue current meds, is doing well on Semaglutide

## 2023-08-23 NOTE — Assessment & Plan Note (Signed)
Tolerating statin, encouraged heart healthy diet, avoid trans fats, minimize simple carbs and saturated fats. Increase exercise as tolerated

## 2023-08-23 NOTE — Assessment & Plan Note (Signed)
Well controlled, no changes to meds. Encouraged heart healthy diet such as the DASH diet and exercise as tolerated.  °

## 2023-08-23 NOTE — Assessment & Plan Note (Signed)
Needs complete cessation

## 2023-08-24 ENCOUNTER — Encounter: Payer: Self-pay | Admitting: Family Medicine

## 2023-08-24 ENCOUNTER — Other Ambulatory Visit (HOSPITAL_COMMUNITY): Payer: Self-pay

## 2023-08-24 ENCOUNTER — Ambulatory Visit (INDEPENDENT_AMBULATORY_CARE_PROVIDER_SITE_OTHER): Payer: BC Managed Care – PPO | Admitting: Family Medicine

## 2023-08-24 VITALS — BP 126/74 | HR 87 | Temp 97.5°F | Resp 16 | Ht 67.0 in | Wt 190.4 lb

## 2023-08-24 DIAGNOSIS — I10 Essential (primary) hypertension: Secondary | ICD-10-CM | POA: Diagnosis not present

## 2023-08-24 DIAGNOSIS — E109 Type 1 diabetes mellitus without complications: Secondary | ICD-10-CM | POA: Diagnosis not present

## 2023-08-24 DIAGNOSIS — R7989 Other specified abnormal findings of blood chemistry: Secondary | ICD-10-CM

## 2023-08-24 DIAGNOSIS — I429 Cardiomyopathy, unspecified: Secondary | ICD-10-CM

## 2023-08-24 DIAGNOSIS — Z23 Encounter for immunization: Secondary | ICD-10-CM | POA: Diagnosis not present

## 2023-08-24 DIAGNOSIS — E785 Hyperlipidemia, unspecified: Secondary | ICD-10-CM

## 2023-08-24 DIAGNOSIS — K5900 Constipation, unspecified: Secondary | ICD-10-CM | POA: Insufficient documentation

## 2023-08-24 DIAGNOSIS — F17200 Nicotine dependence, unspecified, uncomplicated: Secondary | ICD-10-CM

## 2023-08-24 DIAGNOSIS — F418 Other specified anxiety disorders: Secondary | ICD-10-CM

## 2023-08-24 LAB — HEMOGLOBIN A1C: Hgb A1c MFr Bld: 8.4 % — ABNORMAL HIGH (ref 4.6–6.5)

## 2023-08-24 LAB — CBC WITH DIFFERENTIAL/PLATELET
Basophils Absolute: 0.1 10*3/uL (ref 0.0–0.1)
Basophils Relative: 0.7 % (ref 0.0–3.0)
Eosinophils Absolute: 0.3 10*3/uL (ref 0.0–0.7)
Eosinophils Relative: 3 % (ref 0.0–5.0)
HCT: 44.7 % (ref 36.0–46.0)
Hemoglobin: 14.5 g/dL (ref 12.0–15.0)
Lymphocytes Relative: 31.1 % (ref 12.0–46.0)
Lymphs Abs: 2.6 10*3/uL (ref 0.7–4.0)
MCHC: 32.4 g/dL (ref 30.0–36.0)
MCV: 96.5 fl (ref 78.0–100.0)
Monocytes Absolute: 0.5 10*3/uL (ref 0.1–1.0)
Monocytes Relative: 6.2 % (ref 3.0–12.0)
Neutro Abs: 5 10*3/uL (ref 1.4–7.7)
Neutrophils Relative %: 59 % (ref 43.0–77.0)
Platelets: 363 10*3/uL (ref 150.0–400.0)
RBC: 4.63 Mil/uL (ref 3.87–5.11)
RDW: 14.1 % (ref 11.5–15.5)
WBC: 8.4 10*3/uL (ref 4.0–10.5)

## 2023-08-24 LAB — COMPREHENSIVE METABOLIC PANEL
ALT: 16 U/L (ref 0–35)
AST: 14 U/L (ref 0–37)
Albumin: 4.4 g/dL (ref 3.5–5.2)
Alkaline Phosphatase: 79 U/L (ref 39–117)
BUN: 10 mg/dL (ref 6–23)
CO2: 30 mEq/L (ref 19–32)
Calcium: 9.9 mg/dL (ref 8.4–10.5)
Chloride: 102 mEq/L (ref 96–112)
Creatinine, Ser: 0.75 mg/dL (ref 0.40–1.20)
GFR: 92.23 mL/min (ref >60.00)
Glucose, Bld: 136 mg/dL — ABNORMAL HIGH (ref 70–99)
Potassium: 4 mEq/L (ref 3.5–5.1)
Sodium: 140 mEq/L (ref 135–145)
Total Bilirubin: 0.5 mg/dL (ref 0.2–1.2)
Total Protein: 7.7 g/dL (ref 6.0–8.3)

## 2023-08-24 LAB — LIPID PANEL
Cholesterol: 166 mg/dL (ref 0–200)
HDL: 37.4 mg/dL — ABNORMAL LOW (ref >39.00)
LDL Cholesterol: 92 mg/dL (ref 0–99)
NonHDL: 128.84
Total CHOL/HDL Ratio: 4
Triglycerides: 184 mg/dL — ABNORMAL HIGH (ref 0.0–149.0)
VLDL: 36.8 mg/dL (ref 0.0–40.0)

## 2023-08-24 LAB — TSH: TSH: 1.13 u[IU]/mL (ref 0.35–5.50)

## 2023-08-24 MED ORDER — VARENICLINE TARTRATE 1 MG PO TABS
1.0000 mg | ORAL_TABLET | Freq: Two times a day (BID) | ORAL | 5 refills | Status: DC
  Filled 2023-08-24 – 2023-10-09 (×4): qty 60, 30d supply, fill #0
  Filled 2023-11-10: qty 60, 30d supply, fill #1
  Filled 2023-12-10: qty 60, 30d supply, fill #2
  Filled 2024-01-10: qty 60, 30d supply, fill #3
  Filled 2024-02-22: qty 60, 30d supply, fill #4
  Filled 2024-04-19 – 2024-05-02 (×2): qty 60, 30d supply, fill #5

## 2023-08-24 MED ORDER — HYDROXYZINE HCL 10 MG PO TABS
10.0000 mg | ORAL_TABLET | Freq: Three times a day (TID) | ORAL | 2 refills | Status: DC | PRN
  Filled 2023-08-24: qty 40, 7d supply, fill #0
  Filled 2023-09-13 – 2023-12-10 (×2): qty 40, 7d supply, fill #1
  Filled 2024-07-11: qty 40, 7d supply, fill #2

## 2023-08-24 MED ORDER — TIRZEPATIDE 7.5 MG/0.5ML ~~LOC~~ SOAJ
7.5000 mg | SUBCUTANEOUS | 3 refills | Status: DC
Start: 2023-08-24 — End: 2023-12-02
  Filled 2023-08-24 – 2023-09-13 (×5): qty 6, 84d supply, fill #0
  Filled 2023-09-17: qty 2, 28d supply, fill #0
  Filled 2023-10-09 – 2023-10-12 (×2): qty 2, 28d supply, fill #1
  Filled 2023-11-10: qty 2, 28d supply, fill #2

## 2023-08-24 NOTE — Assessment & Plan Note (Signed)
Stable--will continue to monitor

## 2023-08-24 NOTE — Progress Notes (Signed)
Subjective:    Patient ID: Bonnie Dickson, female    DOB: 04/30/1971, 52 y.o.   MRN: [id number]  Chief Complaint  Patient presents with   Follow-up    Follow up     HPI Discussed the use of AI scribe software for clinical note transcription with the patient, who gave verbal consent to proceed.  History of Present Illness   The patient, with a 20-year history of constipation, reports a worsening of symptoms since starting Ozempic. The patient also experiences burping with a sulfur taste, which was previously investigated for H Pylori but returned negative results. The patient is currently managing the constipation with Linzess, taken every three days, and reports that this regimen is effective. The patient also reports a decreased appetite and has established a regular eating schedule to ensure adequate nutrition. The patient has a history of smoking and has been trying to quit with the help of Chantix. However, due to recent life stressors, including family and financial issues, the patient has resumed smoking. The patient also reports frequent urination in the evenings, which is more noticeable when the patient is at rest.        Past Medical History:  Diagnosis Date   Arthritis    lower back   Blood transfusion without reported diagnosis    CAD (coronary artery disease)    a. NSTEMI 03/2021 s/p DES to LAD.   Depression    situational   Diabetes mellitus without complication (HCC)    Endometriosis    GERD (gastroesophageal reflux disease)    History of chicken pox    HSV-2 (herpes simplex virus 2) infection    Hyperglycemia 07/24/2019   Hyperlipidemia    Hypertension    Insomnia    Myocardial infarction (HCC) 03/31/2021   Snoring    Tonsillitis and adenoiditis, chronic    childhood, caused adult snoring    Past Surgical History:  Procedure Laterality Date   ABDOMINAL HYSTERECTOMY     ovaries left in place   BREAST CYST EXCISION Right    35 years ago    COMBINED  ABDOMINOPLASTY AND LIPOSUCTION     CORONARY STENT INTERVENTION N/A 04/01/2021   Procedure: CORONARY STENT INTERVENTION;  Surgeon: Marykay Lex, MD;  Location: [facility] INVASIVE CV LAB;  Service: Cardiovascular;  Laterality: N/A;   ENDOMETRIAL ABLATION     ENDOMETRIAL BIOPSY     LAPAROSCOPIC ENDOMETRIOSIS FULGURATION     LEFT HEART CATH AND CORONARY ANGIOGRAPHY N/A 04/01/2021   Procedure: LEFT HEART CATH AND CORONARY ANGIOGRAPHY;  Surgeon: Marykay Lex, MD;  Location: [facility] INVASIVE CV LAB;  Service: Cardiovascular;  Laterality: N/A;   PARTIAL HYSTERECTOMY     vaginal   TUBAL LIGATION      Family History  Problem Relation Age of Onset   Hypertension Mother    Diabetes Mother        prediabetes   Hyperlipidemia Mother    Diabetes Father    Cataracts Father    Eczema Brother    Asthma Brother    Diabetes Maternal Grandmother    Hypertension Maternal Grandmother    Diabetes Maternal Grandfather    Hypertension Maternal Grandfather    Hyperlipidemia Maternal Grandfather    Heart failure Maternal Grandfather    COPD Maternal Grandfather    ODD Son    ADD / ADHD Son    Diabetes Daughter    Hypertension Daughter    Colon cancer Neg Hx    Stomach cancer Neg Hx  Pancreatic cancer Neg Hx    Esophageal cancer Neg Hx     Social History   Socioeconomic History   Marital status: Widowed    Spouse name: Not on file   Number of children: Not on file   Years of education: Not on file   Highest education level: Not on file  Occupational History   Not on file  Tobacco Use   Smoking status: Former    Current packs/day: 0.00    Average packs/day: 0.5 packs/day for 30.0 years (15.0 ttl pk-yrs)    Types: Cigarettes    Start date: 11/01/1991    Quit date: 10/31/2021    Years since quitting: 1.8   Smokeless tobacco: Never  Vaping Use   Vaping status: Never Used  Substance and Sexual Activity   Alcohol use: Never   Drug use: Never   Sexual activity: Not Currently    Birth  control/protection: Surgical  Other Topics Concern   Not on file  Social History Narrative   Not on file   Social Determinants of Health   Financial Resource Strain: Not on file  Food Insecurity: Not on file  Transportation Needs: Not on file  Physical Activity: Not on file  Stress: Not on file  Social Connections: Not on file  Intimate Partner Violence: Not on file    Outpatient Medications Prior to Visit  Medication Sig Dispense Refill   amLODipine (NORVASC) 10 MG tablet Take 1 tablet (10 mg total) by mouth daily. 90 tablet 2   aspirin EC (ASPIRIN LOW DOSE) 81 MG tablet Take 1 tablet (81 mg total) by mouth daily. Swallow whole. 90 tablet 1   carvedilol (COREG) 6.25 MG tablet Take 1 tablet (6.25 mg total) by mouth 2 (two) times daily with a meal. 60 tablet 6   insulin lispro (HUMALOG KWIKPEN) 100 UNIT/ML KwikPen Max 30 Units daily per correction scale (Patient not taking: Reported on 02/23/2023) 6 mL 0   Insulin Pen Needle 32G X 4 MM MISC Use as directed with Humalog up to 4 times a day; in the morning, at noon, in the evening, and at bedtime. 400 each 2   linaclotide (LINZESS) 145 MCG CAPS capsule Take 1 capsule (145 mcg total) by mouth daily before breakfast. 90 capsule 0   nitroGLYCERIN (NITROSTAT) 0.4 MG SL tablet Place 1 tablet (0.4 mg total) under the tongue every 5 (five) minutes as needed for chest pain (up to 3 doses). 25 tablet 3   rosuvastatin (CRESTOR) 20 MG tablet Take 1 tablet (20 mg total) by mouth daily. 90 tablet 3   tiZANidine (ZANAFLEX) 2 MG tablet Take 0.5-2 tablets (1-4 mg total) by mouth 2 (two) times daily as needed for muscle spasms. 40 tablet 1   valACYclovir (VALTREX) 500 MG tablet Take 1 tablet (500 mg total) by mouth 2 (two) times daily. 180 tablet 1   insulin glargine (LANTUS SOLOSTAR) 100 UNIT/ML Solostar Pen Inject 20 Units into the skin daily. (Patient not taking: Reported on 02/23/2023) 15 mL 6   Semaglutide, 2 MG/DOSE, (OZEMPIC, 2 MG/DOSE,) 8 MG/3ML SOPN  Inject 2 mg into the skin once a week. 3 mL 3   varenicline (CHANTIX CONTINUING MONTH PAK) 1 MG tablet Take 1 tablet (1 mg total) by mouth 2 (two) times daily. 60 tablet 5   No facility-administered medications prior to visit.    Allergies  Allergen Reactions   Atorvastatin     Other reaction(s): myalgias (muscle pain)   Ciprofloxacin Hcl Anaphylaxis  Gentamicin Anaphylaxis   Penicillins Anaphylaxis   Ambien [Zolpidem Tartrate] Other (See Comments)    Sleep walking    Asa [Aspirin] Other (See Comments)    Rectal bleeding    Banana Itching, Swelling and Other (See Comments)    Tongue itches and swells- breathing not impaired   Ivp Dye [Iodinated Contrast Media] Nausea And Vomiting and Other (See Comments)    Severe vomiting    Lisinopril Cough   Metformin And Related Nausea Only and Other (See Comments)    Severe GI upset   Nsaids Other (See Comments)    Rectal bleeding    Pineapple Other (See Comments)    Causes a burning sensation in the mouth   Tape Other (See Comments)    Paper tape "burns the skin"   Codeine Rash   Flagyl [Metronidazole] Rash   Morphine And Codeine Rash    Review of Systems  Constitutional:  Positive for malaise/fatigue. Negative for fever.  HENT:  Negative for congestion.   Eyes:  Negative for blurred vision.  Respiratory:  Negative for shortness of breath.   Cardiovascular:  Negative for chest pain, palpitations and leg swelling.  Gastrointestinal:  Positive for constipation. Negative for abdominal pain, blood in stool, melena, nausea and vomiting.  Genitourinary:  Negative for dysuria and frequency.  Musculoskeletal:  Negative for falls.  Skin:  Negative for rash.  Neurological:  Negative for dizziness, loss of consciousness and headaches.  Endo/Heme/Allergies:  Negative for environmental allergies.  Psychiatric/Behavioral:  Negative for depression. The patient is nervous/anxious.        Objective:    Physical Exam Constitutional:       General: She is not in acute distress.    Appearance: Normal appearance. She is well-developed. She is not toxic-appearing.  HENT:     Head: Normocephalic and atraumatic.     Right Ear: External ear normal.     Left Ear: External ear normal.     Nose: Nose normal.  Eyes:     General:        Right eye: No discharge.        Left eye: No discharge.     Conjunctiva/sclera: Conjunctivae normal.  Neck:     Thyroid: No thyromegaly.  Cardiovascular:     Rate and Rhythm: Normal rate and regular rhythm.     Heart sounds: Normal heart sounds. No murmur heard. Pulmonary:     Effort: Pulmonary effort is normal. No respiratory distress.     Breath sounds: Normal breath sounds.  Abdominal:     General: Bowel sounds are normal.     Palpations: Abdomen is soft.     Tenderness: There is no abdominal tenderness. There is no guarding.  Musculoskeletal:        General: Normal range of motion.     Cervical back: Neck supple.  Lymphadenopathy:     Cervical: No cervical adenopathy.  Skin:    General: Skin is warm and dry.  Neurological:     Mental Status: She is alert and oriented to person, place, and time.  Psychiatric:        Mood and Affect: Mood normal.        Behavior: Behavior normal.        Thought Content: Thought content normal.        Judgment: Judgment normal.     BP 126/74 (BP Location: Left Arm, Patient Position: Sitting, Cuff Size: Normal)   Pulse 87   Temp (!) 97.5 F (36.4 C) (  Oral)   Resp 16   Ht 5\' 7"  (1.702 m)   Wt 190 lb 6.4 oz (86.4 kg)   SpO2 98%   BMI 29.82 kg/m  Wt Readings from Last 3 Encounters:  08/24/23 190 lb 6.4 oz (86.4 kg)  02/16/23 200 lb (90.7 kg)  02/16/23 200 lb (90.7 kg)    Diabetic Foot Exam - Simple   No data filed    Lab Results  Component Value Date   WBC 8.4 08/24/2023   HGB 14.5 08/24/2023   HCT 44.7 08/24/2023   PLT 363.0 08/24/2023   GLUCOSE 136 (H) 08/24/2023   CHOL 166 08/24/2023   TRIG 184.0 (H) 08/24/2023   HDL 37.40  (L) 08/24/2023   LDLCALC 92 08/24/2023   ALT 16 08/24/2023   AST 14 08/24/2023   NA 140 08/24/2023   K 4.0 08/24/2023   CL 102 08/24/2023   CREATININE 0.75 08/24/2023   BUN 10 08/24/2023   CO2 30 08/24/2023   TSH 1.13 08/24/2023   INR 0.9 03/31/2021   HGBA1C 8.4 (H) 08/24/2023    Lab Results  Component Value Date   TSH 1.13 08/24/2023   Lab Results  Component Value Date   WBC 8.4 08/24/2023   HGB 14.5 08/24/2023   HCT 44.7 08/24/2023   MCV 96.5 08/24/2023   PLT 363.0 08/24/2023   Lab Results  Component Value Date   NA 140 08/24/2023   K 4.0 08/24/2023   CO2 30 08/24/2023   GLUCOSE 136 (H) 08/24/2023   BUN 10 08/24/2023   CREATININE 0.75 08/24/2023   BILITOT 0.5 08/24/2023   ALKPHOS 79 08/24/2023   AST 14 08/24/2023   ALT 16 08/24/2023   PROT 7.7 08/24/2023   ALBUMIN 4.4 08/24/2023   CALCIUM 9.9 08/24/2023   ANIONGAP 10 04/02/2021   GFR 92.23 08/24/2023   Lab Results  Component Value Date   CHOL 166 08/24/2023   Lab Results  Component Value Date   HDL 37.40 (L) 08/24/2023   Lab Results  Component Value Date   LDLCALC 92 08/24/2023   Lab Results  Component Value Date   TRIG 184.0 (H) 08/24/2023   Lab Results  Component Value Date   CHOLHDL 4 08/24/2023   Lab Results  Component Value Date   HGBA1C 8.4 (H) 08/24/2023       Assessment & Plan:  Abnormal TSH Assessment & Plan: monitor   Diabetes mellitus, labile (HCC) Assessment & Plan: hgba1c acceptable, minimize simple carbs. Increase exercise as tolerated. Continue current meds, is doing well on Semaglutide   Orders: -     Hemoglobin A1c -     Microalbumin / creatinine urine ratio  Hyperlipidemia LDL goal <70 Assessment & Plan: Tolerating statin, encouraged heart healthy diet, avoid trans fats, minimize simple carbs and saturated fats. Increase exercise as tolerated   Orders: -     Lipid panel  Hypertension, unspecified type Assessment & Plan: Well controlled, no changes to  meds. Encouraged heart healthy diet such as the DASH diet and exercise as tolerated.   Orders: -     Comprehensive metabolic panel -     CBC with Differential/Platelet -     TSH  Tobacco dependence Assessment & Plan: Needs complete cessation but has had significant life stressors involving all of her adult children. Was stopped but has started now, she is willing to attempt cessation again in future   Constipation, unspecified constipation type Assessment & Plan: Has a long history of constipation. Now on  Ozempic it is worse. Switch to [facility] and Encouraged increased hydration and fiber in diet. Daily probiotics. If bowels not moving can use MOM 2 tbls po in 4 oz of warm prune juice by mouth every 2-3 days. If no results then repeat in 4 hours with  Dulcolax suppository pr, may repeat again in 4 more hours as needed. Seek care if symptoms worsen. Consider daily Miralax and/or Dulcolax if symptoms persist.  Consider increasing Linzess to 290 daily   Cardiomyopathy, unspecified type [facility]) Assessment & Plan: Stable will continue to monitor   Depression with anxiety Assessment & Plan: Has been stressed with her children. Given a prescription for Hydroxyzine 10-20 mg po bid prn   Need for influenza vaccination -     Flu vaccine trivalent PF, 6mos and older(Flulaval,Afluria,Fluarix,Fluzone)  Other orders -     Tirzepatide; Inject 7.5 mg into the skin once a week.  Dispense: 6 mL; Refill: 3 -     hydrOXYzine HCl; Take 1-2 tablets (10-20 mg total) by mouth every 8 (eight) hours as needed.  Dispense: 40 tablet; Refill: 2 -     Varenicline Tartrate; Take 1 tablet (1 mg total) by mouth 2 (two) times daily.  Dispense: 60 tablet; Refill: 5    Assessment and Plan    Chronic Constipation Exacerbated by Ozempic, managed with intermittent Linzess. Discussed potential switch to [facility], which may have similar side effects, but could be different due to individual response. -Switch from  Ozempic to [facility] 7.5mg , monitor for tolerance and effectiveness. -Consider increasing Linzess if needed.  Anxiety Recent stressors leading to increased anxiety symptoms, including physical manifestations such as skin crawling and heart racing. -Provide Hydroxyzine 10mg , 1-2 tablets up to twice daily as needed for anxiety symptoms.  Smoking Cessation Recent stressors led to resumption of smoking. Previously successful with Chantix. -Continue Chantix, monitor for effectiveness and side effects.  General Health Maintenance -Administer influenza vaccine today. -Schedule follow-up in 6 months for physical.         Danise Edge, MD

## 2023-08-24 NOTE — Patient Instructions (Signed)
Hypertension, Adult High blood pressure (hypertension) is when the force of blood pumping through the arteries is too strong. The arteries are the blood vessels that carry blood from the heart throughout the body. Hypertension forces the heart to work harder to pump blood and may cause arteries to become narrow or stiff. Untreated or uncontrolled hypertension can lead to a heart attack, heart failure, a stroke, kidney disease, and other problems. A blood pressure reading consists of a higher number over a lower number. Ideally, your blood pressure should be below 120/80. The first ("top") number is called the systolic pressure. It is a measure of the pressure in your arteries as your heart beats. The second ("bottom") number is called the diastolic pressure. It is a measure of the pressure in your arteries as the heart relaxes. What are the causes? The exact cause of this condition is not known. There are some conditions that result in high blood pressure. What increases the risk? Certain factors may make you more likely to develop high blood pressure. Some of these risk factors are under your control, including: Smoking. Not getting enough exercise or physical activity. Being overweight. Having too much fat, sugar, calories, or salt (sodium) in your diet. Drinking too much alcohol. Other risk factors include: Having a personal history of heart disease, diabetes, high cholesterol, or kidney disease. Stress. Having a family history of high blood pressure and high cholesterol. Having obstructive sleep apnea. Age. The risk increases with age. What are the signs or symptoms? High blood pressure may not cause symptoms. Very high blood pressure (hypertensive crisis) may cause: Headache. Fast or irregular heartbeats (palpitations). Shortness of breath. Nosebleed. Nausea and vomiting. Vision changes. Severe chest pain, dizziness, and seizures. How is this diagnosed? This condition is diagnosed by  measuring your blood pressure while you are seated, with your arm resting on a flat surface, your legs uncrossed, and your feet flat on the floor. The cuff of the blood pressure monitor will be placed directly against the skin of your upper arm at the level of your heart. Blood pressure should be measured at least twice using the same arm. Certain conditions can cause a difference in blood pressure between your right and left arms. If you have a high blood pressure reading during one visit or you have normal blood pressure with other risk factors, you may be asked to: Return on a different day to have your blood pressure checked again. Monitor your blood pressure at home for 1 week or longer. If you are diagnosed with hypertension, you may have other blood or imaging tests to help your health care provider understand your overall risk for other conditions. How is this treated? This condition is treated by making healthy lifestyle changes, such as eating healthy foods, exercising more, and reducing your alcohol intake. You may be referred for counseling on a healthy diet and physical activity. Your health care provider may prescribe medicine if lifestyle changes are not enough to get your blood pressure under control and if: Your systolic blood pressure is above 130. Your diastolic blood pressure is above 80. Your personal target blood pressure may vary depending on your medical conditions, your age, and other factors. Follow these instructions at home: Eating and drinking  Eat a diet that is high in fiber and potassium, and low in sodium, added sugar, and fat. An example of this eating plan is called the DASH diet. DASH stands for Dietary Approaches to Stop Hypertension. To eat this way: Eat   plenty of fresh fruits and vegetables. Try to fill one half of your plate at each meal with fruits and vegetables. Eat whole grains, such as whole-wheat pasta, brown rice, or whole-grain bread. Fill about one  fourth of your plate with whole grains. Eat or drink low-fat dairy products, such as skim milk or low-fat yogurt. Avoid fatty cuts of meat, processed or cured meats, and poultry with skin. Fill about one fourth of your plate with lean proteins, such as fish, chicken without skin, beans, eggs, or tofu. Avoid pre-made and processed foods. These tend to be higher in sodium, added sugar, and fat. Reduce your daily sodium intake. Many people with hypertension should eat less than 1,500 mg of sodium a day. Do not drink alcohol if: Your health care provider tells you not to drink. You are pregnant, may be pregnant, or are planning to become pregnant. If you drink alcohol: Limit how much you have to: 0-1 drink a day for women. 0-2 drinks a day for men. Know how much alcohol is in your drink. In the [place] one drink equals one 12 oz bottle of beer (355 mL), one 5 oz glass of wine (148 mL), or one 1 oz glass of hard liquor (44 mL). Lifestyle  Work with your health care provider to maintain a healthy body weight or to lose weight. Ask what an ideal weight is for you. Get at least 30 minutes of exercise that causes your heart to beat faster (aerobic exercise) most days of the week. Activities may include walking, swimming, or biking. Include exercise to strengthen your muscles (resistance exercise), such as Pilates or lifting weights, as part of your weekly exercise routine. Try to do these types of exercises for 30 minutes at least 3 days a week. Do not use any products that contain nicotine or tobacco. These products include cigarettes, chewing tobacco, and vaping devices, such as e-cigarettes. If you need help quitting, ask your health care provider. Monitor your blood pressure at home as told by your health care provider. Keep all follow-up visits. This is important. Medicines Take over-the-counter and prescription medicines only as told by your health care provider. Follow directions carefully. Blood  pressure medicines must be taken as prescribed. Do not skip doses of blood pressure medicine. Doing this puts you at risk for problems and can make the medicine less effective. Ask your health care provider about side effects or reactions to medicines that you should watch for. Contact a health care provider if you: Think you are having a reaction to a medicine you are taking. Have headaches that keep coming back (recurring). Feel dizzy. Have swelling in your ankles. Have trouble with your vision. Get help right away if you: Develop a severe headache or confusion. Have unusual weakness or numbness. Feel faint. Have severe pain in your chest or abdomen. Vomit repeatedly. Have trouble breathing. These symptoms may be an emergency. Get help right away. Call 911. Do not wait to see if the symptoms will go away. Do not drive yourself to the hospital. Summary Hypertension is when the force of blood pumping through your arteries is too strong. If this condition is not controlled, it may put you at risk for serious complications. Your personal target blood pressure may vary depending on your medical conditions, your age, and other factors. For most people, a normal blood pressure is less than 120/80. Hypertension is treated with lifestyle changes, medicines, or a combination of both. Lifestyle changes include losing weight, eating a healthy,   low-sodium diet, exercising more, and limiting alcohol. This information is not intended to replace advice given to you by your health care provider. Make sure you discuss any questions you have with your health care provider. Document Revised: 09/24/2021 Document Reviewed: 09/24/2021 Elsevier Patient Education  2024 [employer]  

## 2023-08-24 NOTE — Assessment & Plan Note (Signed)
Has been stressed with her children. Given a prescription for Hydroxyzine 10-20 mg po bid prn

## 2023-08-24 NOTE — Assessment & Plan Note (Addendum)
Has a long history of constipation. Now on Ozempic it is worse. Switch to [facility] and Encouraged increased hydration and fiber in diet. Daily probiotics. If bowels not moving can use MOM 2 tbls po in 4 oz of warm prune juice by mouth every 2-3 days. If no results then repeat in 4 hours with  Dulcolax suppository pr, may repeat again in 4 more hours as needed. Seek care if symptoms worsen. Consider daily Miralax and/or Dulcolax if symptoms persist.  Consider increasing Linzess to 290 daily

## 2023-08-25 ENCOUNTER — Other Ambulatory Visit (HOSPITAL_COMMUNITY): Payer: Self-pay

## 2023-08-25 LAB — MICROALBUMIN / CREATININE URINE RATIO
Creatinine,U: 402.6 mg/dL
Microalb Creat Ratio: 2.9 mg/g (ref 0.0–30.0)
Microalb, Ur: 11.8 mg/dL — ABNORMAL HIGH (ref 0.0–1.9)

## 2023-09-03 ENCOUNTER — Other Ambulatory Visit (HOSPITAL_COMMUNITY): Payer: Self-pay

## 2023-09-03 ENCOUNTER — Other Ambulatory Visit: Payer: Self-pay

## 2023-09-03 MED ORDER — LOSARTAN POTASSIUM 25 MG PO TABS
25.0000 mg | ORAL_TABLET | Freq: Every day | ORAL | 3 refills | Status: DC
  Filled 2023-09-03: qty 90, 90d supply, fill #0
  Filled 2023-12-01: qty 90, 90d supply, fill #1
  Filled 2024-03-17: qty 90, 90d supply, fill #2
  Filled 2024-06-21 – 2024-06-28 (×2): qty 90, 90d supply, fill #3

## 2023-09-04 ENCOUNTER — Other Ambulatory Visit (HOSPITAL_COMMUNITY): Payer: Self-pay

## 2023-09-09 ENCOUNTER — Other Ambulatory Visit (HOSPITAL_COMMUNITY): Payer: Self-pay

## 2023-09-10 ENCOUNTER — Telehealth: Payer: Self-pay

## 2023-09-10 ENCOUNTER — Other Ambulatory Visit (HOSPITAL_COMMUNITY): Payer: Self-pay

## 2023-09-10 NOTE — Telephone Encounter (Signed)
Pharmacy Patient Advocate Encounter   Received notification from CoverMyMeds that prior authorization for Mounjaro 7.5MG /0.5ML pen-injectors is required/requested.   Insurance verification completed.   The patient is insured through [employer] .   Per test claim: PA required; PA submitted to [employer] via CoverMyMeds Key/confirmation #/[id number] Status is pending

## 2023-09-13 ENCOUNTER — Other Ambulatory Visit: Payer: Self-pay | Admitting: Internal Medicine

## 2023-09-13 ENCOUNTER — Other Ambulatory Visit: Payer: Self-pay | Admitting: Family Medicine

## 2023-09-14 ENCOUNTER — Encounter (HOSPITAL_COMMUNITY): Payer: Self-pay

## 2023-09-14 ENCOUNTER — Other Ambulatory Visit: Payer: Self-pay

## 2023-09-14 ENCOUNTER — Other Ambulatory Visit (HOSPITAL_COMMUNITY): Payer: Self-pay

## 2023-09-14 MED ORDER — TIZANIDINE HCL 2 MG PO TABS
1.0000 mg | ORAL_TABLET | Freq: Two times a day (BID) | ORAL | 1 refills | Status: AC | PRN
  Filled 2023-09-14: qty 40, 10d supply, fill #0
  Filled 2023-11-10: qty 40, 10d supply, fill #1

## 2023-09-15 ENCOUNTER — Other Ambulatory Visit: Payer: Self-pay

## 2023-09-15 ENCOUNTER — Other Ambulatory Visit (HOSPITAL_COMMUNITY): Payer: Self-pay

## 2023-09-15 MED ORDER — CARVEDILOL 6.25 MG PO TABS
6.2500 mg | ORAL_TABLET | Freq: Two times a day (BID) | ORAL | 0 refills | Status: DC
Start: 2023-09-15 — End: 2023-10-13
  Filled 2023-09-15: qty 60, 30d supply, fill #0

## 2023-09-16 ENCOUNTER — Other Ambulatory Visit (HOSPITAL_COMMUNITY): Payer: Self-pay

## 2023-09-17 ENCOUNTER — Other Ambulatory Visit (HOSPITAL_COMMUNITY): Payer: Self-pay

## 2023-09-18 NOTE — Telephone Encounter (Signed)
Pharmacy Patient Advocate Encounter  Received notification from [facility] that Prior Authorization for Mounjaro 7.5MG /0.5ML pen-injectors has been APPROVED from 09/16/2023 to 09/15/2024   PA #/Case ID/Reference #: [id number]

## 2023-10-09 ENCOUNTER — Other Ambulatory Visit (HOSPITAL_BASED_OUTPATIENT_CLINIC_OR_DEPARTMENT_OTHER): Payer: Self-pay | Admitting: Internal Medicine

## 2023-10-09 ENCOUNTER — Other Ambulatory Visit (HOSPITAL_COMMUNITY): Payer: Self-pay

## 2023-10-09 ENCOUNTER — Other Ambulatory Visit: Payer: Self-pay | Admitting: Internal Medicine

## 2023-10-09 ENCOUNTER — Other Ambulatory Visit: Payer: Self-pay | Admitting: Family Medicine

## 2023-10-09 ENCOUNTER — Other Ambulatory Visit: Payer: Self-pay

## 2023-10-09 MED ORDER — AMLODIPINE BESYLATE 10 MG PO TABS
10.0000 mg | ORAL_TABLET | Freq: Every day | ORAL | 0 refills | Status: DC
  Filled 2023-10-09 – 2023-11-24 (×3): qty 30, 30d supply, fill #0

## 2023-10-09 MED ORDER — ASPIRIN 81 MG PO TBEC
81.0000 mg | DELAYED_RELEASE_TABLET | Freq: Every day | ORAL | 1 refills | Status: DC
  Filled 2023-10-09 – 2023-11-24 (×3): qty 90, 90d supply, fill #0
  Filled 2024-02-22: qty 90, 90d supply, fill #1

## 2023-10-09 MED ORDER — ROSUVASTATIN CALCIUM 20 MG PO TABS
20.0000 mg | ORAL_TABLET | Freq: Every day | ORAL | 0 refills | Status: DC
  Filled 2023-10-09 – 2023-11-24 (×3): qty 30, 30d supply, fill #0

## 2023-10-12 ENCOUNTER — Other Ambulatory Visit (HOSPITAL_COMMUNITY): Payer: Self-pay

## 2023-10-13 ENCOUNTER — Other Ambulatory Visit: Payer: Self-pay | Admitting: Internal Medicine

## 2023-10-14 ENCOUNTER — Other Ambulatory Visit (HOSPITAL_COMMUNITY): Payer: Self-pay

## 2023-10-14 MED ORDER — CARVEDILOL 6.25 MG PO TABS
6.2500 mg | ORAL_TABLET | Freq: Two times a day (BID) | ORAL | 0 refills | Status: DC
  Filled 2023-10-14: qty 30, 15d supply, fill #0

## 2023-10-28 ENCOUNTER — Other Ambulatory Visit (HOSPITAL_COMMUNITY): Payer: Self-pay

## 2023-10-28 ENCOUNTER — Telehealth: Payer: Self-pay | Admitting: Internal Medicine

## 2023-10-28 MED ORDER — CARVEDILOL 6.25 MG PO TABS
6.2500 mg | ORAL_TABLET | Freq: Two times a day (BID) | ORAL | 0 refills | Status: DC
  Filled 2023-10-28 – 2023-11-10 (×2): qty 60, 30d supply, fill #0

## 2023-10-28 NOTE — Telephone Encounter (Signed)
Spoke with pt. Carvedilol 6.25 mg was sent to pts pharmacy. Pt is aware that she needs to keep 12/01/23 apt for additional refills.

## 2023-10-28 NOTE — Telephone Encounter (Signed)
*  STAT* If patient is at the pharmacy, call can be transferred to refill team.   1. Which medications need to be refilled? (please list name of each medication and dose if known)   carvedilol (COREG) 6.25 MG tablet   2. Would you like to learn more about the convenience, safety, & potential cost savings by using the [facility]?   3. Are you open to using the [facility] (Type [facility]. ).  4. Which pharmacy/location (including street and city if local pharmacy) is medication to be sent to?  [facility]   5. Do they need a 30 day or 90 day supply?   90 day  Patient still has some medication.  Patient has appointment scheduled on 12/01/23.

## 2023-11-09 ENCOUNTER — Other Ambulatory Visit (HOSPITAL_COMMUNITY): Payer: Self-pay

## 2023-11-10 ENCOUNTER — Other Ambulatory Visit (HOSPITAL_COMMUNITY): Payer: Self-pay

## 2023-11-10 ENCOUNTER — Other Ambulatory Visit: Payer: Self-pay

## 2023-11-24 ENCOUNTER — Other Ambulatory Visit: Payer: Self-pay

## 2023-11-24 ENCOUNTER — Other Ambulatory Visit (HOSPITAL_COMMUNITY): Payer: Self-pay

## 2023-11-29 NOTE — Progress Notes (Signed)
 Cardiology Office Note    Date:  12/01/2023  ID:  Bonnie Dickson, Bonnie Dickson 01/29/1971, MRN [id number] PCP:  Domenica Harlene LABOR, MD  Cardiologist:  Vinie JAYSON Maxcy, MD  Electrophysiologist:  None   Chief Complaint: Follow up for CAD  History of Present Illness: .    Bonnie Dickson is a 52 y.o. female with visit-pertinent history of hypertension, hyperlipidemia, type 2 diabetes mellitus, IBS and tobacco abuse.  In 03/2021 she presented with an NSTEMI.  Prior to NSTEMI she had presented to the hospital in 03/2021 for chest pain where she was found to have minimally elevated high sensitive troponin, echo on 03/26/2021 showed EF 60 to 65%, G2 DD, no RWMA, elevated LA pressures, mild LAE and no significant valvular abnormalities.  Her chest pain was felt to be atypical at that time and likely from hypertensive urgency.  She again presented to the ED on 03/31/2021 with NSTEMI.  She underwent LHC on 04/01/2021 showing two-vessel CAD with 95% ostial proximal LAD stenosis managed with PCI/DES as well as 50% P RCA stenosis and 60% ostial PDA stenosis which were medically managed.  EF was felt to be mildly reduced with a mid apical anterior hypokinesis on cath.   [name] was last seen in clinic on 05/26/2022 by Dr. Maxcy.  She remained stable from a cardiac perspective, she noted some occasional discomfort in the chest that was not associate with exertion or relieved by rest.  Her Plavix  was discontinued and she was continued on aspirin  81 mg daily.  Echo on 06/11/2022 indicated LVEF of 60 to 65%, no RWMA, mild LVH, grade 2 diastolic dysfunction, there were no significant valvular abnormalities.  Today she presents for follow-up.  She reports that she is doing very well overall.  She denies chest pain, shortness of breath or palpitations. She does note she has some occasional lower extremity edema that last for a day, progresses as the day goes on and then completely resolves overnight.  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  She notes that she has restarted smoking, currently smoking 4 cigarettes a day.  Labwork independently reviewed: 08/24/2023: Sodium 140, potassium 4.0, creatinine 0.75, AST 14, ALT 16, hemoglobin 14.5, hematocrit 44.7 ROS: .   Today she denies chest pain, shortness of breath, fatigue, palpitations, melena, hematuria, hemoptysis, diaphoresis, weakness, presyncope, syncope, orthopnea, and PND.  All other systems are reviewed and otherwise negative. Studies Reviewed: SABRA    EKG:  EKG is ordered today, personally reviewed, demonstrating  EKG Interpretation Date/Time:  Tuesday December 01 2023 08:05:56 EST Ventricular Rate:  74 PR Interval:  200 QRS Duration:  76 QT Interval:  390 QTC Calculation: 432 R Axis:   24  Text Interpretation: Normal sinus rhythm Low voltage QRS When compared with ECG of 02-Apr-2021 04:19, Nonspecific T wave abnormality, improved in Inferior leads Nonspecific T wave abnormality, improved in Anterolateral leads Confirmed by Meri Pelot [phone]) on 12/01/2023 8:09:15 AM    CV Studies:  Cardiac Studies & Procedures   CARDIAC CATHETERIZATION  CARDIAC CATHETERIZATION 04/01/2021  Narrative  Culprit Lesion: Ost LAD to Prox LAD lesion is 95% stenosed.  A drug-eluting stent was successfully placed using a SYNERGY XD 2.75X16. -> Postdilated in tapered fashion from 4.1 down to 3.0 mm  Post intervention, there is a 0% residual stenosis.  Dist LAD lesion is 30% stenosed.  ------------------------------  Prox Cx to Mid Cx lesion is 30%  stenosed.  Prox RCA lesion is 50% stenosed.  RPDA lesion is 60% stenosed.  ---------------------------  There is mild left ventricular systolic dysfunction. The left ventricular ejection fraction is 45-50% by visual estimate -> mid to apical anterior hypokinesis  LV end diastolic pressure is mildly elevated.  SUMMARY  Two-vessel CAD: Culprit lesion is severe  95% ostial-proximal LAD-eccentric stenosis; also noted moderate 50% proximal RCA and 60% ostial PDA.  Successful DES PCI of ostial proximal LAD using a synergy DES 2.75 mm x 16 mm postdilated and taper fashion from 4.1 to 3.0 mm.  Mildly reduced EF with mid apical anterior hypokinesis.  Systemic Hypertension with mild to moderately elevated LVEDP.  RECOMMENDATION  Return to nursing unit for ongoing care and TR band removal.  Aggressive blood pressure management-wean off nitroglycerin .  High-dose statin  [facility] 1 consult  Anticipate discharge tomorrow, if medically stable.    Alm Clay, MD  Findings Coronary Findings Diagnostic  Dominance: Right  Left Main Vessel was injected. Vessel is large.  Left Anterior Descending Ost LAD to Prox LAD lesion is 95% stenosed. The lesion is eccentric. Dist LAD lesion is 30% stenosed. The lesion is eccentric.  First Diagonal Branch Vessel is small in size.  First Septal Branch Vessel is small in size.  Second Diagonal Branch Vessel is small in size.  Second Septal Branch Vessel is small in size.  Left Anterior Descending Ost LAD to Prox LAD lesion is 95% stenosed. The lesion is eccentric. Dist LAD lesion is 30% stenosed. The lesion is eccentric.  First Diagonal Branch Vessel is small in size.  First Septal Branch Vessel is small in size.  Second Diagonal Branch Vessel is small in size.  Second Septal Branch Vessel is small in size.  Left Circumflex Vessel is large. Prox Cx to Mid Cx lesion is 30% stenosed. The lesion is located at the bend and eccentric.  First Obtuse Marginal Branch Vessel is small in size.  Third Obtuse Marginal Branch Vessel is small in size.  Left Posterior Atrioventricular Artery Vessel is small in size.  Right Coronary Artery Vessel was injected. Vessel is moderate in size. There is mild diffuse disease throughout the vessel. The vessel is moderately tortuous. Prox RCA lesion is 50%  stenosed. The lesion is discrete and concentric.  Acute Marginal Branch Vessel is small in size.  Right Ventricular Branch Vessel is small in size.  Right Posterior Descending Artery RPDA lesion is 60% stenosed. The lesion is concentric.  Intervention  Ost LAD to Prox LAD lesion Stent Lesion length:  15 mm. CATH VISTA GUIDE 6FR XBLAD3.5 guide catheter was inserted. Lesion crossed with guidewire using a WIRE ASAHI PROWATER 180CM. Pre-stent angioplasty was performed using a BALLOON SAPPHIRE 2.5X12. Maximum pressure:  12 atm. Inflation time:  30 sec. A drug-eluting stent was successfully placed using a SYNERGY XD 2.75X16. Maximum pressure: 14 atm. Inflation time: 30 sec. Stent strut is well apposed. Tapered post dilation from 4.1 down to 3.0 mm Post-stent angioplasty was performed using a BALLOON [place] EMERGE MR 4.0X12. Maximum pressure:  16 atm. Inflation time:  30 sec. BALLOON [place] EUPHORA RX 3.5X8 mid stent to within 2 mm of distal stent -&gt; 14 ATM, 20 sec; 4.0 mm balloon used for the proximal half of the stent Post-Intervention Lesion Assessment The intervention was successful. Pre-interventional TIMI flow is 3. Post-intervention TIMI flow is 3. Treated lesion length:  16 mm. No complications occurred at this lesion. There is a 0% residual stenosis post intervention.  Lida  LAD to Prox LAD lesion Stent See details in Ost LAD to Prox LAD lesion. Post-Intervention Lesion Assessment See details in Ost LAD to Prox LAD lesion. There is a 0% residual stenosis post intervention.    ECHOCARDIOGRAM  ECHOCARDIOGRAM COMPLETE 06/11/2022  Narrative ECHOCARDIOGRAM REPORT    Patient Name:   DAVETTA OLLIFF Date of Exam: 06/11/2022 Medical Rec #:  [id number]        Height:       67.0 in Accession #:    [id number]       Weight:       220.0 lb Date of Birth:  [birth year]        BSA:          2.106 m Patient Age:    51 years         BP:           120/81 mmHg Patient Gender: F                HR:            77 bpm. Exam Location:  [address]  Procedure: 2D Echo, Cardiac Doppler, Color Doppler and Strain Analysis  Indications:    Ischemic cardiomyopathy [I25.5 (ICD-10-CM)]  History:        Patient has prior history of Echocardiogram examinations, most recent 03/26/2021. Cardiomyopathy, Previous Myocardial Infarction; Risk Factors:Dyslipidemia and Hypertension.  Sonographer:    Charlie Jointer RDCS Referring Phys: [phone] KENNETH C HILTY  IMPRESSIONS   1. Left ventricular ejection fraction, by estimation, is 60 to 65%. The left ventricle has normal function. The left ventricle has no regional wall motion abnormalities. There is mild left ventricular hypertrophy. Left ventricular diastolic parameters are consistent with Grade II diastolic dysfunction (pseudonormalization). 2. Right ventricular systolic function is normal. The right ventricular size is normal. 3. No evidence of mitral valve regurgitation. 4. The aortic valve is tricuspid. Aortic valve regurgitation is not visualized. 5. No significant aortic root or ascending aneurysm. 6. The inferior vena cava is normal in size with greater than 50% respiratory variability, suggesting right atrial pressure of 3 mmHg.  Comparison(s): No significant change from prior study.  FINDINGS Left Ventricle: Left ventricular ejection fraction, by estimation, is 60 to 65%. The left ventricle has normal function. The left ventricle has no regional wall motion abnormalities. The left ventricular internal cavity size was normal in size. There is mild left ventricular hypertrophy. Left ventricular diastolic parameters are consistent with Grade II diastolic dysfunction (pseudonormalization).  Right Ventricle: The right ventricular size is normal. Right ventricular systolic function is normal.  Left Atrium: Left atrial size was normal in size.  Right Atrium: Right atrial size was normal in size.  Pericardium: There is no evidence of pericardial  effusion.  Mitral Valve: No evidence of mitral valve regurgitation.  Tricuspid Valve: The tricuspid valve is normal in structure. Tricuspid valve regurgitation is not demonstrated.  Aortic Valve: The aortic valve is tricuspid. Aortic valve regurgitation is not visualized.  Pulmonic Valve: The pulmonic valve was not well visualized. Pulmonic valve regurgitation is not visualized.  Aorta: No significant aortic root or ascending aneurysm.  Venous: The inferior vena cava is normal in size with greater than 50% respiratory variability, suggesting right atrial pressure of 3 mmHg.  IAS/Shunts: No atrial level shunt detected by color flow Doppler.   LEFT VENTRICLE PLAX 2D LVIDd:         4.30 cm   Diastology LVIDs:         3.00  cm   LV e' medial:    7.18 cm/s LV PW:         1.20 cm   LV E/e' medial:  12.0 LV IVS:        1.20 cm   LV e' lateral:   9.25 cm/s LVOT diam:     2.10 cm   LV E/e' lateral: 9.3 LV SV:         80 LV SV Index:   38 LVOT Area:     3.46 cm   RIGHT VENTRICLE             IVC RV Basal diam:  3.60 cm     IVC diam: 1.40 cm RV Mid diam:    2.40 cm RV S prime:     13.50 cm/s TAPSE (M-mode): 2.5 cm  LEFT ATRIUM             Index        RIGHT ATRIUM           Index LA diam:        3.60 cm 1.71 cm/m   RA Area:     12.70 cm LA Vol (A2C):   47.1 ml 22.37 ml/m  RA Volume:   29.60 ml  14.06 ml/m LA Vol (A4C):   45.8 ml 21.75 ml/m LA Biplane Vol: 46.5 ml 22.08 ml/m AORTIC VALVE LVOT Vmax:   113.00 cm/s LVOT Vmean:  82.000 cm/s LVOT VTI:    0.230 m  AORTA Ao Root diam: 3.40 cm Ao Asc diam:  3.40 cm Ao Desc diam: 2.20 cm  MITRAL VALVE MV Area (PHT): 3.48 cm    SHUNTS MV Decel Time: 218 msec    Systemic VTI:  0.23 m MV E velocity: 85.90 cm/s  Systemic Diam: 2.10 cm MV A velocity: 91.70 cm/s MV E/A ratio:  0.94  [place] signed by Ronal Ross Signature Date/Time: 06/11/2022/3:34:28 PM    Final              Current Reported  Medications:.    Current Meds  Medication Sig   amLODipine  (NORVASC ) 10 MG tablet Take 1 tablet (10 mg total) by mouth daily. Please call [phone] to schedule an overdue appointment for future refills. Thank you.   aspirin  EC (ASPIRIN  LOW DOSE) 81 MG tablet Take 1 tablet (81 mg total) by mouth daily. Swallow whole.   carvedilol  (COREG ) 6.25 MG tablet Take 1 tablet (6.25 mg total) by mouth 2 (two) times daily with a meal. (Please keep schedule appointment 12/31 for futher refills)   hydrOXYzine  (ATARAX ) 10 MG tablet Take 1-2 tablets (10-20 mg total) by mouth every 8 (eight) hours as needed.   insulin  lispro (HUMALOG  KWIKPEN) 100 UNIT/ML KwikPen Max 30 Units daily per correction scale   Insulin  Pen Needle 32G X 4 MM MISC Use as directed with Humalog  up to 4 times a day; in the morning, at noon, in the evening, and at bedtime.   linaclotide  (LINZESS ) 145 MCG CAPS capsule Take 1 capsule (145 mcg total) by mouth daily before breakfast.   losartan  (COZAAR ) 25 MG tablet Take 1 tablet (25 mg total) by mouth daily.   nitroGLYCERIN  (NITROSTAT ) 0.4 MG SL tablet Place 1 tablet (0.4 mg total) under the tongue every 5 (five) minutes as needed for chest pain (up to 3 doses).   rosuvastatin  (CRESTOR ) 20 MG tablet Take 1 tablet (20 mg total) by mouth daily. Please call [phone] to schedule an appointment for future refills. Thank  you.   tirzepatide  (MOUNJARO ) 7.5 MG/0.5ML Pen Inject 7.5 mg into the skin once a week.   tiZANidine  (ZANAFLEX ) 2 MG tablet Take 0.5-2 tablets (1-4 mg total) by mouth 2 (two) times daily as needed for muscle spasms.   valACYclovir  (VALTREX ) 500 MG tablet Take 1 tablet (500 mg total) by mouth 2 (two) times daily.   varenicline  (CHANTIX  CONTINUING MONTH PAK) 1 MG tablet Take 1 tablet (1 mg total) by mouth 2 (two) times daily.   Physical Exam:    VS:  BP (!) 135/93 (BP Location: Left Arm, Patient Position: Sitting, Cuff Size: Normal)   Pulse 74   Ht 5' 6 (1.676 m)   Wt 200 lb  (90.7 kg)   SpO2 (!) 88%   BMI 32.28 kg/m    Wt Readings from Last 3 Encounters:  12/01/23 200 lb (90.7 kg)  08/24/23 190 lb 6.4 oz (86.4 kg)  02/16/23 200 lb (90.7 kg)    GEN: Well nourished, well developed in no acute distress NECK: No JVD; No carotid bruits CARDIAC: RRR, no murmurs, rubs, gallops RESPIRATORY:  Clear to auscultation without rales, wheezing or rhonchi  ABDOMEN: Soft, non-tender, non-distended EXTREMITIES:  No edema; No acute deformity   Asessement and Plan:.    CAD: s/p NSTEMI in 03/2021,underwent LHC on 04/01/2021 showing two-vessel CAD with 95% ostial proximal LAD stenosis managed with PCI/DES as well as 50% P RCA stenosis and 60% ostial PDA stenosis which were medically managed. Stable with no anginal symptoms. No indication for ischemic evaluation.  Heart healthy diet and regular cardiovascular exercise encouraged.  Continue aspirin  81 mg daily, amlodipine  10 mg daily, carvedilol  6.25 mg twice daily, losartan  25 mg daily. Increase rosuvastatin  to 40 mg daily.   Hyperlipidemia: Last lipid profile on 08/24/2023 indicated total cholesterol 166, HDL 37.4, triglycerides 184 and LDL 92. Given patient's early onset CAD, history of diabetes her LDL goal should be less than 55. Increase rosuvastatin  to 40 mg daily. Recheck fasting lipid profile and LFTs in 2 months.   Hypertension: Blood pressure today 126/78.  She reports that at home she has similar blood pressure readings. Continue current antihypertensive regimen.  Lower extremity edema: Patient reports history of intermittent lower extremity edema.  She notes that typically her edema will progress as the day goes on if she has been standing on her feet all day or if she has been sitting in a dependent position.  Edema resolves overnight.  No edema appreciated on exam today. Encouraged staying well-hydrated and decreasing her salt intake as well as elevating lower extremities.  She will continue to monitor her symptoms and notify  the office if worsening.  Type 2 diabetes mellitus: Last hemoglobin A1c 8.4 on 08/24/2023.  Monitored and managed per PCP. On Mounjaro .   Tobacco use: Patient reports she is smoking four cigarettes a day currently, has increased stressors currently. Cessation encouraged.     Disposition: F/u with Dr. Mona in one year.   Signed, Zowie Lundahl D Kursten Kruk, NP

## 2023-12-01 ENCOUNTER — Other Ambulatory Visit (HOSPITAL_COMMUNITY): Payer: Self-pay

## 2023-12-01 ENCOUNTER — Encounter: Payer: Self-pay | Admitting: Cardiology

## 2023-12-01 ENCOUNTER — Ambulatory Visit: Payer: BC Managed Care – PPO | Attending: Cardiology | Admitting: Cardiology

## 2023-12-01 ENCOUNTER — Encounter: Payer: Self-pay | Admitting: Family Medicine

## 2023-12-01 VITALS — BP 126/78 | HR 74 | Ht 66.0 in | Wt 200.0 lb

## 2023-12-01 DIAGNOSIS — I10 Essential (primary) hypertension: Secondary | ICD-10-CM

## 2023-12-01 DIAGNOSIS — R600 Localized edema: Secondary | ICD-10-CM

## 2023-12-01 DIAGNOSIS — E785 Hyperlipidemia, unspecified: Secondary | ICD-10-CM | POA: Diagnosis not present

## 2023-12-01 DIAGNOSIS — E119 Type 2 diabetes mellitus without complications: Secondary | ICD-10-CM

## 2023-12-01 DIAGNOSIS — I2510 Atherosclerotic heart disease of native coronary artery without angina pectoris: Secondary | ICD-10-CM

## 2023-12-01 DIAGNOSIS — Z720 Tobacco use: Secondary | ICD-10-CM | POA: Diagnosis not present

## 2023-12-01 MED ORDER — CARVEDILOL 6.25 MG PO TABS
6.2500 mg | ORAL_TABLET | Freq: Two times a day (BID) | ORAL | 3 refills | Status: DC
  Filled 2023-12-01 – 2023-12-06 (×2): qty 180, 90d supply, fill #0
  Filled 2024-03-17: qty 180, 90d supply, fill #1
  Filled 2024-06-21 – 2024-06-28 (×2): qty 180, 90d supply, fill #2
  Filled 2024-09-26: qty 180, 90d supply, fill #3

## 2023-12-01 MED ORDER — ROSUVASTATIN CALCIUM 40 MG PO TABS
40.0000 mg | ORAL_TABLET | Freq: Every day | ORAL | 3 refills | Status: DC
  Filled 2023-12-01: qty 90, 90d supply, fill #0

## 2023-12-01 MED ORDER — AMLODIPINE BESYLATE 10 MG PO TABS
10.0000 mg | ORAL_TABLET | Freq: Every day | ORAL | 3 refills | Status: DC
  Filled 2023-12-01 – 2023-12-23 (×3): qty 90, 90d supply, fill #0
  Filled 2024-03-18: qty 90, 90d supply, fill #1
  Filled 2024-06-21 – 2024-06-28 (×2): qty 90, 90d supply, fill #2
  Filled 2024-09-26: qty 90, 90d supply, fill #3

## 2023-12-01 NOTE — Telephone Encounter (Signed)
Pt was on Ozempic, then switched to [facility]. Please advise.

## 2023-12-01 NOTE — Patient Instructions (Signed)
 Medication Instructions:  Increase Rosuvastatin  to 40 mg once a day *If you need a refill on your cardiac medications before your next appointment, please call your pharmacy*  Lab Work: In 2 months we will need to draw fasting lipid panel and LFT If you have labs (blood work) drawn today and your tests are completely normal, you will receive your results only by: MyChart Message (if you have MyChart) OR A paper copy in the mail If you have any lab test that is abnormal or we need to change your treatment, we will call you to review the results.  Testing/Procedures: No testing  Follow-Up: At [facility], you and your health needs are our priority.  As part of our continuing mission to provide you with exceptional heart care, we have created designated Provider Care Teams.  These Care Teams include your primary Cardiologist (physician) and Advanced Practice Providers (APPs -  Physician Assistants and Nurse Practitioners) who all work together to provide you with the care you need, when you need it.  We recommend signing up for the patient portal called MyChart.  Sign up information is provided on this After Visit Summary.  MyChart is used to connect with patients for Virtual Visits (Telemedicine).  Patients are able to view lab/test results, encounter notes, upcoming appointments, etc.  Non-urgent messages can be sent to your provider as well.   To learn more about what you can do with MyChart, go to [email or web address].    Your next appointment:   1 year(s)  Provider:   Vinie JAYSON Maxcy, MD

## 2023-12-02 ENCOUNTER — Other Ambulatory Visit: Payer: Self-pay | Admitting: Family

## 2023-12-02 MED ORDER — TIRZEPATIDE 10 MG/0.5ML ~~LOC~~ SOAJ
10.0000 mg | SUBCUTANEOUS | 1 refills | Status: DC
  Filled 2023-12-02: qty 6, 84d supply, fill #0
  Filled 2023-12-03 (×3): qty 2, 28d supply, fill #0
  Filled 2024-04-19: qty 6, 84d supply, fill #0
  Filled 2024-05-02: qty 2, 28d supply, fill #0

## 2023-12-03 ENCOUNTER — Other Ambulatory Visit (HOSPITAL_COMMUNITY): Payer: Self-pay

## 2023-12-03 MED ORDER — MOUNJARO 10 MG/0.5ML ~~LOC~~ SOAJ
10.0000 mg | SUBCUTANEOUS | 1 refills | Status: DC
  Filled 2023-12-03: qty 2, 28d supply, fill #0
  Filled 2024-01-10: qty 2, 28d supply, fill #1
  Filled 2024-02-04: qty 6, 84d supply, fill #2

## 2023-12-04 ENCOUNTER — Other Ambulatory Visit (HOSPITAL_COMMUNITY): Payer: Self-pay

## 2023-12-07 ENCOUNTER — Other Ambulatory Visit (HOSPITAL_COMMUNITY): Payer: Self-pay

## 2023-12-07 ENCOUNTER — Other Ambulatory Visit: Payer: Self-pay

## 2023-12-11 ENCOUNTER — Other Ambulatory Visit (HOSPITAL_COMMUNITY): Payer: Self-pay

## 2023-12-14 ENCOUNTER — Encounter (HOSPITAL_BASED_OUTPATIENT_CLINIC_OR_DEPARTMENT_OTHER): Payer: Self-pay | Admitting: Internal Medicine

## 2023-12-17 NOTE — Telephone Encounter (Signed)
Have patient hold rosuvastatin for a week then resume at 20 mg daily.  Will add Dr. Rennis Golden to see if he wants to see patient or have pharmD visit for potential PCSK9.  Won't get to LDL goal of < 55 with addition of ezetimbie.

## 2023-12-23 ENCOUNTER — Other Ambulatory Visit (HOSPITAL_COMMUNITY): Payer: Self-pay

## 2023-12-24 NOTE — Telephone Encounter (Signed)
Patient called and cancelled pharmd appt for 1/24

## 2023-12-25 ENCOUNTER — Ambulatory Visit: Payer: BC Managed Care – PPO

## 2024-01-11 ENCOUNTER — Other Ambulatory Visit (HOSPITAL_COMMUNITY): Payer: Self-pay

## 2024-02-01 ENCOUNTER — Telehealth: Payer: Self-pay | Admitting: *Deleted

## 2024-02-01 NOTE — Telephone Encounter (Signed)
 Patient was identified as falling into the [place] Measure - Diabetes.   Patient was: Appointment scheduled with primary care provider in the next 30 days.

## 2024-02-05 ENCOUNTER — Other Ambulatory Visit (HOSPITAL_COMMUNITY): Payer: Self-pay

## 2024-02-05 ENCOUNTER — Other Ambulatory Visit: Payer: Self-pay

## 2024-02-22 ENCOUNTER — Encounter: Payer: BC Managed Care – PPO | Admitting: Family Medicine

## 2024-02-22 ENCOUNTER — Ambulatory Visit (INDEPENDENT_AMBULATORY_CARE_PROVIDER_SITE_OTHER): Admitting: Family Medicine

## 2024-02-22 VITALS — BP 133/80 | HR 79 | Temp 98.0°F | Resp 18 | Ht 67.0 in | Wt 204.8 lb

## 2024-02-22 DIAGNOSIS — E785 Hyperlipidemia, unspecified: Secondary | ICD-10-CM

## 2024-02-22 DIAGNOSIS — I10 Essential (primary) hypertension: Secondary | ICD-10-CM

## 2024-02-22 DIAGNOSIS — Z0000 Encounter for general adult medical examination without abnormal findings: Secondary | ICD-10-CM | POA: Diagnosis not present

## 2024-02-22 DIAGNOSIS — G4700 Insomnia, unspecified: Secondary | ICD-10-CM

## 2024-02-22 DIAGNOSIS — E109 Type 1 diabetes mellitus without complications: Secondary | ICD-10-CM | POA: Diagnosis not present

## 2024-02-22 DIAGNOSIS — Z1231 Encounter for screening mammogram for malignant neoplasm of breast: Secondary | ICD-10-CM

## 2024-02-22 LAB — MICROALBUMIN / CREATININE URINE RATIO
Creatinine,U: 340 mg/dL
Microalb Creat Ratio: 10.2 mg/g (ref 0.0–30.0)
Microalb, Ur: 3.5 mg/dL — ABNORMAL HIGH (ref 0.0–1.9)

## 2024-02-22 NOTE — Patient Instructions (Addendum)
 Shingrix is the new shingles shot, 2 shots over 2-6 months, confirm coverage with insurance and document, then can return here for shots with nurse appt or at pharmacy   Annual flu and covid boosters in the fall  RSV, Respiratory Syncitial Virus Vaccine, Arexvy at pharmacy   Preventive Care 6-52 Years Old, Female Preventive care refers to lifestyle choices and visits with your health care provider that can promote health and wellness. Preventive care visits are also called wellness exams. What can I expect for my preventive care visit? Counseling Your health care provider may ask you questions about your: Medical history, including: Past medical problems. Family medical history. Pregnancy history. Current health, including: Menstrual cycle. Method of birth control. Emotional well-being. Home life and relationship well-being. Sexual activity and sexual health. Lifestyle, including: Alcohol, nicotine or tobacco, and drug use. Access to firearms. Diet, exercise, and sleep habits. Work and work Astronomer. Sunscreen use. Safety issues such as seatbelt and bike helmet use. Physical exam Your health care provider will check your: Height and weight. These may be used to calculate your BMI (body mass index). BMI is a measurement that tells if you are at a healthy weight. Waist circumference. This measures the distance around your waistline. This measurement also tells if you are at a healthy weight and may help predict your risk of certain diseases, such as type 2 diabetes and high blood pressure. Heart rate and blood pressure. Body temperature. Skin for abnormal spots. What immunizations do I need?  Vaccines are usually given at various ages, according to a schedule. Your health care provider will recommend vaccines for you based on your age, medical history, and lifestyle or other factors, such as travel or where you work. What tests do I need? Screening Your health care provider  may recommend screening tests for certain conditions. This may include: Lipid and cholesterol levels. Diabetes screening. This is done by checking your blood sugar (glucose) after you have not eaten for a while (fasting). Pelvic exam and Pap test. Hepatitis B test. Hepatitis C test. HIV (human immunodeficiency virus) test. STI (sexually transmitted infection) testing, if you are at risk. Lung cancer screening. Colorectal cancer screening. Mammogram. Talk with your health care provider about when you should start having regular mammograms. This may depend on whether you have a family history of breast cancer. BRCA-related cancer screening. This may be done if you have a family history of breast, ovarian, tubal, or peritoneal cancers. Bone density scan. This is done to screen for osteoporosis. Talk with your health care provider about your test results, treatment options, and if necessary, the need for more tests. Follow these instructions at home: Eating and drinking  Eat a diet that includes fresh fruits and vegetables, whole grains, lean protein, and low-fat dairy products. Take vitamin and mineral supplements as recommended by your health care provider. Do not drink alcohol if: Your health care provider tells you not to drink. You are pregnant, may be pregnant, or are planning to become pregnant. If you drink alcohol: Limit how much you have to 0-1 drink a day. Know how much alcohol is in your drink. In the [place] one drink equals one 12 oz bottle of beer (355 mL), one 5 oz glass of wine (148 mL), or one 1 oz glass of hard liquor (44 mL). Lifestyle Brush your teeth every morning and night with fluoride toothpaste. Floss one time each day. Exercise for at least 30 minutes 5 or more days each week. Do not use  any products that contain nicotine or tobacco. These products include cigarettes, chewing tobacco, and vaping devices, such as e-cigarettes. If you need help quitting, ask your  health care provider. Do not use drugs. If you are sexually active, practice safe sex. Use a condom or other form of protection to prevent STIs. If you do not wish to become pregnant, use a form of birth control. If you plan to become pregnant, see your health care provider for a prepregnancy visit. Take aspirin only as told by your health care provider. Make sure that you understand how much to take and what form to take. Work with your health care provider to find out whether it is safe and beneficial for you to take aspirin daily. Find healthy ways to manage stress, such as: Meditation, yoga, or listening to music. Journaling. Talking to a trusted person. Spending time with friends and family. Minimize exposure to UV radiation to reduce your risk of skin cancer. Safety Always wear your seat belt while driving or riding in a vehicle. Do not drive: If you have been drinking alcohol. Do not ride with someone who has been drinking. When you are tired or distracted. While texting. If you have been using any mind-altering substances or drugs. Wear a helmet and other protective equipment during sports activities. If you have firearms in your house, make sure you follow all gun safety procedures. Seek help if you have been physically or sexually abused. What's next? Visit your health care provider once a year for an annual wellness visit. Ask your health care provider how often you should have your eyes and teeth checked. Stay up to date on all vaccines. This information is not intended to replace advice given to you by your health care provider. Make sure you discuss any questions you have with your health care provider. Document Revised: 05/15/2021 Document Reviewed: 05/15/2021 Elsevier Patient Education  2024 [employer].

## 2024-02-22 NOTE — Assessment & Plan Note (Signed)
 Patient encouraged to maintain heart healthy diet, regular exercise, adequate sleep. Consider daily probiotics. Take medications as prescribed. Colonoscopy April 2023 repeat in 10 years. Pap 2021, no further paps required Mgm 9/23 repeat in 2024 Labs ordered and reviewed  Colonoscopy 2023 repeat in 10 years.  Given and reviewed copy of ACP documents from [employer] and encouraged to complete and return

## 2024-02-23 ENCOUNTER — Encounter: Payer: Self-pay | Admitting: Family Medicine

## 2024-02-23 ENCOUNTER — Other Ambulatory Visit: Payer: Self-pay | Admitting: Emergency Medicine

## 2024-02-23 ENCOUNTER — Other Ambulatory Visit: Payer: Self-pay

## 2024-02-23 DIAGNOSIS — I10 Essential (primary) hypertension: Secondary | ICD-10-CM

## 2024-02-23 LAB — CBC WITH DIFFERENTIAL/PLATELET
Basophils Absolute: 0.1 10*3/uL (ref 0.0–0.1)
Basophils Relative: 1.3 % (ref 0.0–3.0)
Eosinophils Absolute: 0.2 10*3/uL (ref 0.0–0.7)
Eosinophils Relative: 2.9 % (ref 0.0–5.0)
HCT: 43.3 % (ref 36.0–46.0)
Hemoglobin: 14.4 g/dL (ref 12.0–15.0)
Lymphocytes Relative: 35.9 % (ref 12.0–46.0)
Lymphs Abs: 3 10*3/uL (ref 0.7–4.0)
MCHC: 33.2 g/dL (ref 30.0–36.0)
MCV: 96 fl (ref 78.0–100.0)
Monocytes Absolute: 0.7 10*3/uL (ref 0.1–1.0)
Monocytes Relative: 8.8 % (ref 3.0–12.0)
Neutro Abs: 4.2 10*3/uL (ref 1.4–7.7)
Neutrophils Relative %: 51.1 % (ref 43.0–77.0)
Platelets: 413 10*3/uL — ABNORMAL HIGH (ref 150.0–400.0)
RBC: 4.51 Mil/uL (ref 3.87–5.11)
RDW: 14.4 % (ref 11.5–15.5)
WBC: 8.2 10*3/uL (ref 4.0–10.5)

## 2024-02-23 LAB — LIPID PANEL
Cholesterol: 232 mg/dL — ABNORMAL HIGH (ref 0–200)
HDL: 39.3 mg/dL (ref >39.00)
LDL Cholesterol: 170 mg/dL — ABNORMAL HIGH (ref 0–99)
NonHDL: 193.01
Total CHOL/HDL Ratio: 6
Triglycerides: 114 mg/dL (ref 0.0–149.0)
VLDL: 22.8 mg/dL (ref 0.0–40.0)

## 2024-02-23 LAB — COMPREHENSIVE METABOLIC PANEL
ALT: 14 U/L (ref 0–35)
AST: 13 U/L (ref 0–37)
Albumin: 4.5 g/dL (ref 3.5–5.2)
Alkaline Phosphatase: 89 U/L (ref 39–117)
BUN: 11 mg/dL (ref 6–23)
CO2: 28 meq/L (ref 19–32)
Calcium: 10.1 mg/dL (ref 8.4–10.5)
Chloride: 99 meq/L (ref 96–112)
Creatinine, Ser: 0.86 mg/dL (ref 0.40–1.20)
GFR: 77.99 mL/min (ref >60.00)
Glucose, Bld: 107 mg/dL — ABNORMAL HIGH (ref 70–99)
Potassium: 4.5 meq/L (ref 3.5–5.1)
Sodium: 136 meq/L (ref 135–145)
Total Bilirubin: 0.4 mg/dL (ref 0.2–1.2)
Total Protein: 7.9 g/dL (ref 6.0–8.3)

## 2024-02-23 LAB — TSH: TSH: 1.88 u[IU]/mL (ref 0.35–5.50)

## 2024-02-23 LAB — HEMOGLOBIN A1C: Hgb A1c MFr Bld: 7.3 % — ABNORMAL HIGH (ref 4.6–6.5)

## 2024-02-24 ENCOUNTER — Encounter: Payer: Self-pay | Admitting: Family Medicine

## 2024-02-24 NOTE — Assessment & Plan Note (Signed)
 Well controlled, no changes to meds. Encouraged heart healthy diet such as the DASH diet and exercise as tolerated.

## 2024-02-24 NOTE — Assessment & Plan Note (Signed)
 Tolerating statin, encouraged heart healthy diet, avoid trans fats, minimize simple carbs and saturated fats. Increase exercise as tolerated

## 2024-02-24 NOTE — Progress Notes (Addendum)
 Subjective:    Patient ID: Bonnie Dickson, female    DOB: [birth year], 53 y.o.   MRN: [id number]  Chief Complaint  Patient presents with   Annual Exam    HPI Discussed the use of AI scribe software for clinical note transcription with the patient, who gave verbal consent to proceed.  History of Present Illness Bonnie Dickson is a 53 year old female with diabetes who presents for follow-up on her kidney function and diabetes management.  A discrepancy in her urine microalbumin to creatinine ratio was identified due to a software error, which has since been corrected. Last year's result was initially reported as 2.9 but should have been 29, indicating she is on the edge of microalbuminuria.  Her blood sugar levels have been stable, ranging between 120 to 130 mg/dL, with no episodes of hypoglycemia. She is currently taking an unspecified diabetes medication at 10 mg, increased from 7.5 mg due to previous trends of rising blood sugar levels. She has a 90-day supply of the 10 mg dose.  She is currently taking Atarax for stress and anxiety, which she finds helpful for short-term relief. She manages stress by staying busy and decompressing at home. She is dealing with stress related to her adult children, particularly her son, who has been causing family issues.  She is using Chantix for smoking cessation, which she finds less effective than during her previous use, possibly due to life stressors.  She has a history of an abnormal Pap smear in 2021, attributed to trichomonas, and has been advised by her gynecologist that no further follow-up is needed unless issues arise. She had a hysterectomy, and her last Pap smear was four years ago. No lumps or bumps in the breast, no discharge or other gynecological concerns, no numbness or tingling, no sores on feet, bowel movements are regular.    Past Medical History:  Diagnosis Date   Arthritis    lower back   Blood transfusion without reported  diagnosis    CAD (coronary artery disease)    a. NSTEMI 03/2021 s/p DES to LAD.   Depression    situational   Diabetes mellitus without complication (HCC)    Endometriosis    GERD (gastroesophageal reflux disease)    History of chicken pox    HSV-2 (herpes simplex virus 2) infection    Hyperglycemia 07/24/2019   Hyperlipidemia    Hypertension    Insomnia    Myocardial infarction (HCC) 03/31/2021   Snoring    Tonsillitis and adenoiditis, chronic    childhood, caused adult snoring    Past Surgical History:  Procedure Laterality Date   ABDOMINAL HYSTERECTOMY     ovaries left in place   BREAST CYST EXCISION Right    35 years ago    COMBINED ABDOMINOPLASTY AND LIPOSUCTION     CORONARY STENT INTERVENTION N/A 04/01/2021   Procedure: CORONARY STENT INTERVENTION;  Surgeon: Marykay Lex, MD;  Location: [facility] INVASIVE CV LAB;  Service: Cardiovascular;  Laterality: N/A;   ENDOMETRIAL ABLATION     ENDOMETRIAL BIOPSY     LAPAROSCOPIC ENDOMETRIOSIS FULGURATION     LEFT HEART CATH AND CORONARY ANGIOGRAPHY N/A 04/01/2021   Procedure: LEFT HEART CATH AND CORONARY ANGIOGRAPHY;  Surgeon: Marykay Lex, MD;  Location: [facility] INVASIVE CV LAB;  Service: Cardiovascular;  Laterality: N/A;   PARTIAL HYSTERECTOMY     vaginal   TUBAL LIGATION      Family History  Problem Relation Age of Onset   Hypertension Mother  Diabetes Mother        prediabetes   Hyperlipidemia Mother    Diabetes Father    Cataracts Father    Eczema Brother    Asthma Brother    Diabetes Maternal Grandmother    Hypertension Maternal Grandmother    Diabetes Maternal Grandfather    Hypertension Maternal Grandfather    Hyperlipidemia Maternal Grandfather    Heart failure Maternal Grandfather    COPD Maternal Grandfather    ODD Son    ADD / ADHD Son    Diabetes Daughter    Hypertension Daughter    Colon cancer Neg Hx    Stomach cancer Neg Hx    Pancreatic cancer Neg Hx    Esophageal cancer Neg Hx     Social History    Socioeconomic History   Marital status: Widowed    Spouse name: Not on file   Number of children: Not on file   Years of education: Not on file   Highest education level: Master's degree (e.g., MA, MS, MEng, MEd, MSW, MBA)  Occupational History   Not on file  Tobacco Use   Smoking status: Former    Current packs/day: 0.00    Average packs/day: 0.5 packs/day for 30.0 years (15.0 ttl pk-yrs)    Types: Cigarettes    Start date: 11/01/1991    Quit date: 10/31/2021    Years since quitting: 2.3   Smokeless tobacco: Never  Vaping Use   Vaping status: Never Used  Substance and Sexual Activity   Alcohol use: Never   Drug use: Never   Sexual activity: Not Currently    Birth control/protection: Surgical  Other Topics Concern   Not on file  Social History Narrative   Not on file   Social Drivers of Health   Financial Resource Strain: Low Risk  (02/22/2024)   Overall Financial Resource Strain (CARDIA)    Difficulty of Paying Living Expenses: Not hard at all  Food Insecurity: No Food Insecurity (02/22/2024)   Hunger Vital Sign    Worried About Running Out of Food in the Last Year: Never true    Ran Out of Food in the Last Year: Never true  Transportation Needs: No Transportation Needs (02/22/2024)   PRAPARE - Administrator, Civil Service (Medical): No    Lack of Transportation (Non-Medical): No  Physical Activity: Sufficiently Active (02/22/2024)   Exercise Vital Sign    Days of Exercise per Week: 3 days    Minutes of Exercise per Session: 120 min  Stress: Stress Concern Present (02/22/2024)   [employer] of Occupational Health - Occupational Stress Questionnaire    Feeling of Stress : To some extent  Social Connections: Moderately Isolated (02/22/2024)   Social Connection and Isolation Panel [NHANES]    Frequency of Communication with Friends and Family: Three times a week    Frequency of Social Gatherings with Friends and Family: Three times a week    Attends  Religious Services: Never    Active Member of Clubs or Organizations: Yes    Attends Banker Meetings: More than 4 times per year    Marital Status: Widowed  Intimate Partner Violence: Not on file    Outpatient Medications Prior to Visit  Medication Sig Dispense Refill   amLODipine (NORVASC) 10 MG tablet Take 1 tablet (10 mg total) by mouth daily. 90 tablet 3   aspirin EC (ASPIRIN LOW DOSE) 81 MG tablet Take 1 tablet (81 mg total) by mouth daily. Swallow whole.  90 tablet 1   carvedilol (COREG) 6.25 MG tablet Take 1 tablet (6.25 mg total) by mouth 2 (two) times daily with a meal. 180 tablet 3   hydrOXYzine (ATARAX) 10 MG tablet Take 1-2 tablets (10-20 mg total) by mouth every 8 (eight) hours as needed. 40 tablet 2   insulin lispro (HUMALOG KWIKPEN) 100 UNIT/ML KwikPen Max 30 Units daily per correction scale 6 mL 0   Insulin Pen Needle 32G X 4 MM MISC Use as directed with Humalog up to 4 times a day; in the morning, at noon, in the evening, and at bedtime. 400 each 2   linaclotide (LINZESS) 145 MCG CAPS capsule Take 1 capsule (145 mcg total) by mouth daily before breakfast. 90 capsule 0   losartan (COZAAR) 25 MG tablet Take 1 tablet (25 mg total) by mouth daily. 90 tablet 3   nitroGLYCERIN (NITROSTAT) 0.4 MG SL tablet Place 1 tablet (0.4 mg total) under the tongue every 5 (five) minutes as needed for chest pain (up to 3 doses). 25 tablet 3   rosuvastatin (CRESTOR) 40 MG tablet Take 1 tablet (40 mg total) by mouth daily. 90 tablet 3   tirzepatide (MOUNJARO) 10 MG/0.5ML Pen Inject 10 mg into the skin once a week. 6 mL 1   tiZANidine (ZANAFLEX) 2 MG tablet Take 0.5-2 tablets (1-4 mg total) by mouth 2 (two) times daily as needed for muscle spasms. 40 tablet 1   valACYclovir (VALTREX) 500 MG tablet Take 1 tablet (500 mg total) by mouth 2 (two) times daily. 180 tablet 1   varenicline (CHANTIX CONTINUING MONTH PAK) 1 MG tablet Take 1 tablet (1 mg total) by mouth 2 (two) times daily. 60  tablet 5   tirzepatide (MOUNJARO) 10 MG/0.5ML Pen Inject 10 mg into the skin once a week. 6 mL 1   No facility-administered medications prior to visit.    Allergies  Allergen Reactions   Ambien [Zolpidem]     Sleep Walking   Atorvastatin     Other reaction(s): myalgias (muscle pain)   Ciprofloxacin Hcl Anaphylaxis   Gentamicin Anaphylaxis   Penicillins Anaphylaxis   Ambien [Zolpidem Tartrate] Other (See Comments)    Sleep walking    Asa [Aspirin] Other (See Comments)    Rectal bleeding    Banana Itching, Swelling and Other (See Comments)    Tongue itches and swells- breathing not impaired   Ivp Dye [Iodinated Contrast Media] Nausea And Vomiting and Other (See Comments)    Severe vomiting    Lisinopril Cough   Metformin And Related Nausea Only and Other (See Comments)    Severe GI upset   Nsaids Other (See Comments)    Rectal bleeding    Pineapple Other (See Comments)    Causes a burning sensation in the mouth   Tape Other (See Comments)    Paper tape "burns the skin"   Codeine Rash   Flagyl [Metronidazole] Rash   Morphine And Codeine Rash    Review of Systems  Constitutional:  Positive for malaise/fatigue. Negative for chills and fever.  HENT:  Negative for congestion and hearing loss.   Eyes:  Negative for discharge.  Respiratory:  Negative for cough, sputum production and shortness of breath.   Cardiovascular:  Negative for chest pain, palpitations and leg swelling.  Gastrointestinal:  Negative for abdominal pain, blood in stool, constipation, diarrhea, heartburn, nausea and vomiting.  Genitourinary:  Negative for dysuria, frequency, hematuria and urgency.  Musculoskeletal:  Negative for back pain, falls and myalgias.  Skin:  Negative for rash.  Neurological:  Negative for dizziness, sensory change, loss of consciousness, weakness and headaches.  Endo/Heme/Allergies:  Negative for environmental allergies. Does not bruise/bleed easily.  Psychiatric/Behavioral:   Negative for depression and suicidal ideas. The patient is nervous/anxious. The patient does not have insomnia.        Objective:    Physical Exam Constitutional:      General: She is not in acute distress.    Appearance: Normal appearance. She is not ill-appearing, toxic-appearing or diaphoretic.  HENT:     Head: Normocephalic and atraumatic.     Right Ear: Tympanic membrane, ear canal and external ear normal.     Left Ear: Tympanic membrane, ear canal and external ear normal.     Nose: Nose normal.     Mouth/Throat:     Mouth: Mucous membranes are moist.     Pharynx: Oropharynx is clear. No oropharyngeal exudate.  Eyes:     General: No scleral icterus.       Right eye: No discharge.        Left eye: No discharge.     Conjunctiva/sclera: Conjunctivae normal.     Pupils: Pupils are equal, round, and reactive to light.  Neck:     Thyroid: No thyromegaly.  Cardiovascular:     Rate and Rhythm: Normal rate and regular rhythm.     Heart sounds: Normal heart sounds. No murmur heard. Pulmonary:     Effort: Pulmonary effort is normal. No respiratory distress.     Breath sounds: Normal breath sounds. No wheezing or rales.  Abdominal:     General: Bowel sounds are normal. There is no distension.     Palpations: Abdomen is soft. There is no mass.     Tenderness: There is no abdominal tenderness.  Musculoskeletal:        General: No tenderness. Normal range of motion.     Cervical back: Normal range of motion and neck supple.  Lymphadenopathy:     Cervical: No cervical adenopathy.  Skin:    General: Skin is warm and dry.     Findings: No rash.  Neurological:     General: No focal deficit present.     Mental Status: She is alert and oriented to person, place, and time.     Cranial Nerves: No cranial nerve deficit.     Coordination: Coordination normal.     Deep Tendon Reflexes: Reflexes are normal and symmetric. Reflexes normal.  Psychiatric:        Mood and Affect: Mood normal.         Behavior: Behavior normal.        Thought Content: Thought content normal.        Judgment: Judgment normal.     BP 133/80 (BP Location: Right Arm, Patient Position: Sitting, Cuff Size: Large)   Pulse 79   Temp 98 F (36.7 C) (Oral)   Resp 18   Ht 5\' 7"  (1.702 m)   Wt 204 lb 12.8 oz (92.9 kg)   SpO2 99%   BMI 32.08 kg/m  Wt Readings from Last 3 Encounters:  02/22/24 204 lb 12.8 oz (92.9 kg)  12/01/23 200 lb (90.7 kg)  08/24/23 190 lb 6.4 oz (86.4 kg)    Diabetic Foot Exam - Simple   Simple Foot Form Visual Inspection No deformities, no ulcerations, no other skin breakdown bilaterally: Yes Sensation Testing Intact to touch and monofilament testing bilaterally: Yes Pulse Check Posterior Tibialis and Dorsalis pulse intact bilaterally:  Yes Comments    Lab Results  Component Value Date   WBC 8.2 02/22/2024   HGB 14.4 02/22/2024   HCT 43.3 02/22/2024   PLT 413.0 (H) 02/22/2024   GLUCOSE 107 (H) 02/22/2024   CHOL 232 (H) 02/22/2024   TRIG 114.0 02/22/2024   HDL 39.30 02/22/2024   LDLCALC 170 (H) 02/22/2024   ALT 14 02/22/2024   AST 13 02/22/2024   NA 136 02/22/2024   K 4.5 02/22/2024   CL 99 02/22/2024   CREATININE 0.86 02/22/2024   BUN 11 02/22/2024   CO2 28 02/22/2024   TSH 1.88 02/22/2024   INR 0.9 03/31/2021   HGBA1C 7.3 (H) 02/22/2024   MICROALBUR 3.5 (H) 02/22/2024    Lab Results  Component Value Date   TSH 1.88 02/22/2024   Lab Results  Component Value Date   WBC 8.2 02/22/2024   HGB 14.4 02/22/2024   HCT 43.3 02/22/2024   MCV 96.0 02/22/2024   PLT 413.0 (H) 02/22/2024   Lab Results  Component Value Date   NA 136 02/22/2024   K 4.5 02/22/2024   CO2 28 02/22/2024   GLUCOSE 107 (H) 02/22/2024   BUN 11 02/22/2024   CREATININE 0.86 02/22/2024   BILITOT 0.4 02/22/2024   ALKPHOS 89 02/22/2024   AST 13 02/22/2024   ALT 14 02/22/2024   PROT 7.9 02/22/2024   ALBUMIN 4.5 02/22/2024   CALCIUM 10.1 02/22/2024   ANIONGAP 10 04/02/2021    GFR 77.99 02/22/2024   Lab Results  Component Value Date   CHOL 232 (H) 02/22/2024   Lab Results  Component Value Date   HDL 39.30 02/22/2024   Lab Results  Component Value Date   LDLCALC 170 (H) 02/22/2024   Lab Results  Component Value Date   TRIG 114.0 02/22/2024   Lab Results  Component Value Date   CHOLHDL 6 02/22/2024   Lab Results  Component Value Date   HGBA1C 7.3 (H) 02/22/2024       Assessment & Plan:  Preventative health care Assessment & Plan: Patient encouraged to maintain heart healthy diet, regular exercise, adequate sleep. Consider daily probiotics. Take medications as prescribed. Colonoscopy April 2023 repeat in 10 years. Pap 2021, no further paps required Mgm 9/23 repeat in 2024 Labs ordered and reviewed  Colonoscopy 2023 repeat in 10 years.  Given and reviewed copy of ACP documents from [facility] Secretary of State and encouraged to complete and return    Hypertension, unspecified type Assessment & Plan: Well controlled, no changes to meds. Encouraged heart healthy diet such as the DASH diet and exercise as tolerated.   Orders: -     CBC with Differential/Platelet -     TSH  Hyperlipidemia LDL goal <70 -     Lipid panel  Diabetes mellitus, labile (HCC) Assessment & Plan: hgba1c acceptable, minimize simple carbs. Increase exercise as tolerated. Continue current meds, is doing well on Mounjaro  Orders: -     Microalbumin / creatinine urine ratio -     Comprehensive metabolic panel -     Hemoglobin A1c  Breast cancer screening by mammogram -     3D Screening Mammogram, Left and Right; Future  Insomnia, unspecified type Assessment & Plan: Encouraged good sleep hygiene such as dark, quiet room. No blue/green glowing lights such as computer screens in bedroom. No alcohol or stimulants in evening. Cut down on caffeine as able. Regular exercise is helpful but not just prior to bed time.      Assessment  and Plan Assessment & Plan Diabetes  mellitus Blood glucose levels controlled at 120-130 mg/dL. Urine microalbumin corrected to 29, just below microalbuminuria threshold. On Losartan 25 mg for renal protection. Discussed Jardiance or Marcelline Deist for renal protection, but she prefers increasing Losartan due to side effects and cost concerns. - Repeat urine microalbumin test today. - Consider increasing Losartan to 50 mg if microalbuminuria confirmed. - Monitor A1c and adjust insulin if necessary. - Encourage hydration, regular exercise, and maintaining nutritional status.  Tobacco dependence Using Varenicline for smoking cessation, less effective possibly due to life stressors.  General Health Maintenance Emphasized hydration for renal function and overall health. Recommended age-appropriate vaccinations and screenings. Advised annual influenza and COVID vaccinations. RSV vaccine recommended due to diabetes risk and insurance coverage. - Order mammogram at breast center due to previous mild abnormality. - Recommend RSV vaccine due to diabetes risk. - Encourage annual influenza and COVID vaccinations. - Plan for shingles vaccine administration on a Friday.  Goals of Care Designated healthcare power of attorney and has a template for a living will, not yet completed. Discussed importance of having documents accessible in medical chart.  Follow-up - Schedule three-month virtual follow-up to review lab results. - Schedule six-month in-person follow-up.     Danise Edge, MD

## 2024-02-24 NOTE — Assessment & Plan Note (Addendum)
 hgba1c acceptable, minimize simple carbs. Increase exercise as tolerated. Continue current meds, is doing well on [facility]

## 2024-02-24 NOTE — Assessment & Plan Note (Signed)
 Encouraged good sleep hygiene such as dark, quiet room. No blue/green glowing lights such as computer screens in bedroom. No alcohol or stimulants in evening. Cut down on caffeine as able. Regular exercise is helpful but not just prior to bed time.

## 2024-02-26 ENCOUNTER — Other Ambulatory Visit (HOSPITAL_BASED_OUTPATIENT_CLINIC_OR_DEPARTMENT_OTHER): Payer: Self-pay

## 2024-02-26 MED ORDER — ZOSTER VAC RECOMB ADJUVANTED 50 MCG/0.5ML IM SUSR
0.5000 mL | Freq: Once | INTRAMUSCULAR | 0 refills | Status: AC
  Filled 2024-02-26: qty 0.5, 1d supply, fill #0

## 2024-03-17 DIAGNOSIS — H5213 Myopia, bilateral: Secondary | ICD-10-CM | POA: Diagnosis not present

## 2024-03-18 ENCOUNTER — Ambulatory Visit
Admission: RE | Admit: 2024-03-18 | Discharge: 2024-03-18 | Disposition: A | Discharge status: Home / Self Care | Source: Ambulatory Visit | Attending: Family Medicine | Admitting: Family Medicine

## 2024-03-18 DIAGNOSIS — Z1231 Encounter for screening mammogram for malignant neoplasm of breast: Secondary | ICD-10-CM | POA: Diagnosis not present

## 2024-04-19 ENCOUNTER — Other Ambulatory Visit (HOSPITAL_COMMUNITY): Payer: Self-pay

## 2024-04-26 ENCOUNTER — Other Ambulatory Visit

## 2024-04-29 ENCOUNTER — Other Ambulatory Visit (HOSPITAL_COMMUNITY): Payer: Self-pay

## 2024-05-02 ENCOUNTER — Encounter: Payer: Self-pay | Admitting: Family Medicine

## 2024-05-02 ENCOUNTER — Other Ambulatory Visit (HOSPITAL_COMMUNITY): Payer: Self-pay

## 2024-05-02 ENCOUNTER — Other Ambulatory Visit: Payer: Self-pay | Admitting: Family Medicine

## 2024-05-02 ENCOUNTER — Other Ambulatory Visit: Payer: Self-pay

## 2024-05-02 MED ORDER — VALACYCLOVIR HCL 500 MG PO TABS
500.0000 mg | ORAL_TABLET | Freq: Two times a day (BID) | ORAL | 0 refills | Status: AC
  Filled 2024-05-02: qty 180, 90d supply, fill #0
  Filled 2024-05-03: qty 60, 30d supply, fill #0

## 2024-05-03 ENCOUNTER — Other Ambulatory Visit (HOSPITAL_COMMUNITY): Payer: Self-pay

## 2024-05-05 ENCOUNTER — Ambulatory Visit (INDEPENDENT_AMBULATORY_CARE_PROVIDER_SITE_OTHER): Admitting: Family Medicine

## 2024-05-05 ENCOUNTER — Other Ambulatory Visit (HOSPITAL_BASED_OUTPATIENT_CLINIC_OR_DEPARTMENT_OTHER): Payer: Self-pay

## 2024-05-05 ENCOUNTER — Telehealth: Payer: Self-pay

## 2024-05-05 ENCOUNTER — Other Ambulatory Visit (HOSPITAL_COMMUNITY): Payer: Self-pay

## 2024-05-05 ENCOUNTER — Other Ambulatory Visit: Payer: Self-pay

## 2024-05-05 ENCOUNTER — Encounter: Payer: Self-pay | Admitting: Family Medicine

## 2024-05-05 VITALS — BP 128/84 | HR 70 | Resp 16 | Ht 67.0 in | Wt 204.2 lb

## 2024-05-05 DIAGNOSIS — E109 Type 1 diabetes mellitus without complications: Secondary | ICD-10-CM

## 2024-05-05 DIAGNOSIS — E785 Hyperlipidemia, unspecified: Secondary | ICD-10-CM

## 2024-05-05 DIAGNOSIS — K649 Unspecified hemorrhoids: Secondary | ICD-10-CM

## 2024-05-05 DIAGNOSIS — K5900 Constipation, unspecified: Secondary | ICD-10-CM

## 2024-05-05 DIAGNOSIS — K625 Hemorrhage of anus and rectum: Secondary | ICD-10-CM | POA: Diagnosis not present

## 2024-05-05 MED ORDER — TIRZEPATIDE 12.5 MG/0.5ML ~~LOC~~ SOAJ
12.5000 mg | SUBCUTANEOUS | 2 refills | Status: DC
  Filled 2024-05-05: qty 6, 84d supply, fill #0

## 2024-05-05 MED ORDER — LINACLOTIDE 145 MCG PO CAPS
145.0000 ug | ORAL_CAPSULE | Freq: Every day | ORAL | 1 refills | Status: DC
  Filled 2024-05-05 (×2): qty 90, 90d supply, fill #0

## 2024-05-05 MED ORDER — NITROGLYCERIN 0.4 % RE OINT
1.0000 [in_us] | TOPICAL_OINTMENT | Freq: Four times a day (QID) | RECTAL | 1 refills | Status: DC | PRN
  Filled 2024-05-05: qty 30, 30d supply, fill #0
  Filled 2024-05-09: qty 30, 10d supply, fill #0

## 2024-05-05 NOTE — Assessment & Plan Note (Signed)
 Consider Linzess  daily and fiber supplements

## 2024-05-05 NOTE — Assessment & Plan Note (Signed)
Increase Mounjaro to 12.5 mg weekly.

## 2024-05-05 NOTE — Progress Notes (Signed)
 Subjective:    Patient ID: Bonnie Dickson, female    DOB: 01/29/1971, 53 y.o.   MRN: [id number]  Chief Complaint  Patient presents with   Acute Visit    Patient presents today for a hemorrhoid follow-up.    HPI Discussed the use of AI scribe software for clinical note transcription with the patient, who gave verbal consent to proceed.  History of Present Illness Bonnie Dickson is a 53 year old female with chronic hemorrhoids who presents with severe hemorrhoidal symptoms.  She has experienced hemorrhoids for over thirty years, with recent exacerbations causing significant discomfort. Two episodes in recent months resulted in such severe swelling that bowel movements were nearly impossible. She experiences tenesmus, pain, and bleeding with bowel movements, noting the presence of blood with clots.  During her last colonoscopy, her hemorrhoids were identified as very large. She uses Linzess  for chronic constipation, which helps soften her stool, but she still experiences episodes requiring enemas, which have been ineffective. Suppositories dissolve without producing results. The external hemorrhoid is so large that it is palpable when sitting, even when not inflamed.  Her current medication regimen includes Linzess , taken every other day. She drinks at least six bottles of water daily and uses a fiber supplement, Friendly Fiber, which she tolerates well. Despite these measures, she still experiences straining during bowel movements. No complaints of CP/palp/SOB/HA/congestion/fevers or GU c/o. Taking meds as prescribed     Past Medical History:  Diagnosis Date   Arthritis    lower back   Blood transfusion without reported diagnosis    CAD (coronary artery disease)    a. NSTEMI 03/2021 s/p DES to LAD.   Depression    situational   Diabetes mellitus without complication (HCC)    Endometriosis    GERD (gastroesophageal reflux disease)    History of chicken pox    HSV-2 (herpes  simplex virus 2) infection    Hyperglycemia 07/24/2019   Hyperlipidemia    Hypertension    Insomnia    Myocardial infarction (HCC) 03/31/2021   Snoring    Tonsillitis and adenoiditis, chronic    childhood, caused adult snoring    Past Surgical History:  Procedure Laterality Date   ABDOMINAL HYSTERECTOMY     ovaries left in place   BREAST CYST EXCISION Right    35 years ago    COMBINED ABDOMINOPLASTY AND LIPOSUCTION     CORONARY STENT INTERVENTION N/A 04/01/2021   Procedure: CORONARY STENT INTERVENTION;  Surgeon: Arleen Lacer, MD;  Location: [facility] INVASIVE CV LAB;  Service: Cardiovascular;  Laterality: N/A;   ENDOMETRIAL ABLATION     ENDOMETRIAL BIOPSY     LAPAROSCOPIC ENDOMETRIOSIS FULGURATION     LEFT HEART CATH AND CORONARY ANGIOGRAPHY N/A 04/01/2021   Procedure: LEFT HEART CATH AND CORONARY ANGIOGRAPHY;  Surgeon: Arleen Lacer, MD;  Location: [facility] INVASIVE CV LAB;  Service: Cardiovascular;  Laterality: N/A;   PARTIAL HYSTERECTOMY     vaginal   TUBAL LIGATION      Family History  Problem Relation Age of Onset   Hypertension Mother    Diabetes Mother        prediabetes   Hyperlipidemia Mother    Diabetes Father    Cataracts Father    Eczema Brother    Asthma Brother    Diabetes Maternal Grandmother    Hypertension Maternal Grandmother    Diabetes Maternal Grandfather    Hypertension Maternal Grandfather    Hyperlipidemia Maternal Grandfather    Heart failure Maternal Grandfather  COPD Maternal Grandfather    ODD Son    ADD / ADHD Son    Diabetes Daughter    Hypertension Daughter    Colon cancer Neg Hx    Stomach cancer Neg Hx    Pancreatic cancer Neg Hx    Esophageal cancer Neg Hx     Social History   Socioeconomic History   Marital status: Widowed    Spouse name: Not on file   Number of children: Not on file   Years of education: Not on file   Highest education level: Master's degree (e.g., MA, MS, MEng, MEd, MSW, MBA)  Occupational History   Not on  file  Tobacco Use   Smoking status: Former    Current packs/day: 0.00    Average packs/day: 0.5 packs/day for 30.0 years (15.0 ttl pk-yrs)    Types: Cigarettes    Start date: 11/01/1991    Quit date: 10/31/2021    Years since quitting: 2.5   Smokeless tobacco: Never  Vaping Use   Vaping status: Never Used  Substance and Sexual Activity   Alcohol use: Never   Drug use: Never   Sexual activity: Not Currently    Birth control/protection: Surgical  Other Topics Concern   Not on file  Social History Narrative   Not on file   Social Drivers of Health   Financial Resource Strain: Low Risk  (02/22/2024)   Overall Financial Resource Strain (CARDIA)    Difficulty of Paying Living Expenses: Not hard at all  Food Insecurity: No Food Insecurity (02/22/2024)   Hunger Vital Sign    Worried About Running Out of Food in the Last Year: Never true    Ran Out of Food in the Last Year: Never true  Transportation Needs: No Transportation Needs (02/22/2024)   PRAPARE - Administrator, Civil Service (Medical): No    Lack of Transportation (Non-Medical): No  Physical Activity: Sufficiently Active (02/22/2024)   Exercise Vital Sign    Days of Exercise per Week: 3 days    Minutes of Exercise per Session: 120 min  Stress: Stress Concern Present (02/22/2024)   [employer] of Occupational Health - Occupational Stress Questionnaire    Feeling of Stress : To some extent  Social Connections: Moderately Isolated (02/22/2024)   Social Connection and Isolation Panel [NHANES]    Frequency of Communication with Friends and Family: Three times a week    Frequency of Social Gatherings with Friends and Family: Three times a week    Attends Religious Services: Never    Active Member of Clubs or Organizations: Yes    Attends Banker Meetings: More than 4 times per year    Marital Status: Widowed  Intimate Partner Violence: Not on file    Outpatient Medications Prior to Visit   Medication Sig Dispense Refill   amLODipine  (NORVASC ) 10 MG tablet Take 1 tablet (10 mg total) by mouth daily. 90 tablet 3   aspirin  EC (ASPIRIN  LOW DOSE) 81 MG tablet Take 1 tablet (81 mg total) by mouth daily. Swallow whole. 90 tablet 1   carvedilol  (COREG ) 6.25 MG tablet Take 1 tablet (6.25 mg total) by mouth 2 (two) times daily with a meal. 180 tablet 3   hydrOXYzine  (ATARAX ) 10 MG tablet Take 1-2 tablets (10-20 mg total) by mouth every 8 (eight) hours as needed. 40 tablet 2   insulin  lispro (HUMALOG  KWIKPEN) 100 UNIT/ML KwikPen Max 30 Units daily per correction scale 6 mL 0   Insulin   Pen Needle 32G X 4 MM MISC Use as directed with Humalog  up to 4 times a day; in the morning, at noon, in the evening, and at bedtime. 400 each 2   losartan  (COZAAR ) 25 MG tablet Take 1 tablet (25 mg total) by mouth daily. 90 tablet 3   nitroGLYCERIN  (NITROSTAT ) 0.4 MG SL tablet Place 1 tablet (0.4 mg total) under the tongue every 5 (five) minutes as needed for chest pain (up to 3 doses). 25 tablet 3   rosuvastatin  (CRESTOR ) 40 MG tablet Take 1 tablet (40 mg total) by mouth daily. 90 tablet 3   tiZANidine  (ZANAFLEX ) 2 MG tablet Take 0.5-2 tablets (1-4 mg total) by mouth 2 (two) times daily as needed for muscle spasms. 40 tablet 1   valACYclovir  (VALTREX ) 500 MG tablet Take 1 tablet (500 mg total) by mouth 2 (two) times daily. 180 tablet 0   varenicline  (CHANTIX  CONTINUING MONTH PAK) 1 MG tablet Take 1 tablet (1 mg total) by mouth 2 (two) times daily. 60 tablet 5   linaclotide  (LINZESS ) 145 MCG CAPS capsule Take 1 capsule (145 mcg total) by mouth daily before breakfast. 90 capsule 0   tirzepatide  (MOUNJARO ) 10 MG/0.5ML Pen Inject 10 mg into the skin once a week. 6 mL 1   No facility-administered medications prior to visit.    Allergies  Allergen Reactions   Ambien [Zolpidem]     Sleep Walking   Atorvastatin      Other reaction(s): myalgias (muscle pain)   Ciprofloxacin Hcl Anaphylaxis   Gentamicin  Anaphylaxis   Penicillins Anaphylaxis   Ambien [Zolpidem Tartrate] Other (See Comments)    Sleep walking    Norlene Beavers ] Other (See Comments)    Rectal bleeding    Banana Itching, Swelling and Other (See Comments)    Tongue itches and swells- breathing not impaired   Ivp Dye [Iodinated Contrast Media] Nausea And Vomiting and Other (See Comments)    Severe vomiting    Lisinopril Cough   Metformin And Related Nausea Only and Other (See Comments)    Severe GI upset   Nsaids Other (See Comments)    Rectal bleeding    Pineapple Other (See Comments)    Causes a burning sensation in the mouth   Tape Other (See Comments)    Paper tape "burns the skin"   Codeine Rash   Flagyl [Metronidazole] Rash   Morphine And Codeine Rash    Review of Systems  Constitutional:  Negative for fever and malaise/fatigue.  HENT:  Negative for congestion.   Eyes:  Negative for blurred vision.  Respiratory:  Negative for shortness of breath.   Cardiovascular:  Negative for chest pain, palpitations and leg swelling.  Gastrointestinal:  Positive for abdominal pain, blood in stool, constipation and diarrhea. Negative for melena and nausea.  Genitourinary:  Negative for dysuria and frequency.  Musculoskeletal:  Positive for joint pain. Negative for falls.  Skin:  Negative for rash.  Neurological:  Negative for dizziness, loss of consciousness and headaches.  Endo/Heme/Allergies:  Negative for environmental allergies.  Psychiatric/Behavioral:  Negative for depression. The patient is not nervous/anxious.        Objective:     Physical Exam Constitutional:      General: She is not in acute distress.    Appearance: Normal appearance. She is well-developed. She is not toxic-appearing.  HENT:     Head: Normocephalic and atraumatic.     Right Ear: External ear normal.     Left Ear: External ear normal.  Nose: Nose normal.  Eyes:     General:        Right eye: No discharge.        Left eye: No  discharge.     Conjunctiva/sclera: Conjunctivae normal.  Neck:     Thyroid : No thyromegaly.  Cardiovascular:     Rate and Rhythm: Normal rate and regular rhythm.     Heart sounds: Normal heart sounds. No murmur heard. Pulmonary:     Effort: Pulmonary effort is normal. No respiratory distress.     Breath sounds: Normal breath sounds.  Abdominal:     General: Bowel sounds are normal.     Palpations: Abdomen is soft.     Tenderness: There is no abdominal tenderness. There is no guarding.  Musculoskeletal:        General: Normal range of motion.     Cervical back: Neck supple.  Lymphadenopathy:     Cervical: No cervical adenopathy.  Skin:    General: Skin is warm and dry.  Neurological:     Mental Status: She is alert and oriented to person, place, and time.  Psychiatric:        Mood and Affect: Mood normal.        Behavior: Behavior normal.        Thought Content: Thought content normal.        Judgment: Judgment normal.     BP 128/84   Pulse 70   Resp 16   Ht 5\' 7"  (1.702 m)   Wt 204 lb 3.2 oz (92.6 kg)   SpO2 98%   BMI 31.98 kg/m  Wt Readings from Last 3 Encounters:  05/05/24 204 lb 3.2 oz (92.6 kg)  02/22/24 204 lb 12.8 oz (92.9 kg)  12/01/23 200 lb (90.7 kg)    Diabetic Foot Exam - Simple   No data filed    Lab Results  Component Value Date   WBC 8.2 02/22/2024   HGB 14.4 02/22/2024   HCT 43.3 02/22/2024   PLT 413.0 (H) 02/22/2024   GLUCOSE 107 (H) 02/22/2024   CHOL 232 (H) 02/22/2024   TRIG 114.0 02/22/2024   HDL 39.30 02/22/2024   LDLCALC 170 (H) 02/22/2024   ALT 14 02/22/2024   AST 13 02/22/2024   NA 136 02/22/2024   K 4.5 02/22/2024   CL 99 02/22/2024   CREATININE 0.86 02/22/2024   BUN 11 02/22/2024   CO2 28 02/22/2024   TSH 1.88 02/22/2024   INR 0.9 03/31/2021   HGBA1C 7.3 (H) 02/22/2024   MICROALBUR 3.5 (H) 02/22/2024    Lab Results  Component Value Date   TSH 1.88 02/22/2024   Lab Results  Component Value Date   WBC 8.2  02/22/2024   HGB 14.4 02/22/2024   HCT 43.3 02/22/2024   MCV 96.0 02/22/2024   PLT 413.0 (H) 02/22/2024   Lab Results  Component Value Date   NA 136 02/22/2024   K 4.5 02/22/2024   CO2 28 02/22/2024   GLUCOSE 107 (H) 02/22/2024   BUN 11 02/22/2024   CREATININE 0.86 02/22/2024   BILITOT 0.4 02/22/2024   ALKPHOS 89 02/22/2024   AST 13 02/22/2024   ALT 14 02/22/2024   PROT 7.9 02/22/2024   ALBUMIN 4.5 02/22/2024   CALCIUM  10.1 02/22/2024   ANIONGAP 10 04/02/2021   GFR 77.99 02/22/2024   Lab Results  Component Value Date   CHOL 232 (H) 02/22/2024   Lab Results  Component Value Date   HDL 39.30 02/22/2024  Lab Results  Component Value Date   LDLCALC 170 (H) 02/22/2024   Lab Results  Component Value Date   TRIG 114.0 02/22/2024   Lab Results  Component Value Date   CHOLHDL 6 02/22/2024   Lab Results  Component Value Date   HGBA1C 7.3 (H) 02/22/2024       Assessment & Plan:  Hemorrhoids, unspecified hemorrhoid type -     Ambulatory referral to Gastroenterology  Constipation, unspecified constipation type Assessment & Plan: Consider Linzess  daily and fiber supplements   BRBPR (bright red blood per rectum) -     CBC with Differential/Platelet  Hyperlipidemia LDL goal <70 Assessment & Plan: Tolerating statin, encouraged heart healthy diet, avoid trans fats, minimize simple carbs and saturated fats. Increase exercise as tolerated    Diabetes mellitus, labile (HCC) Assessment & Plan: Increase Mounjaro  to 12.5 mg weekly   Other orders -     Nitroglycerin ; Place 1 inch rectally 4 (four) times daily as needed (hemorrhoids). Apply 1 inch (375 mg) ointment intra-anally every 12 hours for anal fissure  Dispense: 30 g; Refill: 1 -     linaCLOtide ; Take 1 capsule (145 mcg total) by mouth daily before breakfast.  Dispense: 90 capsule; Refill: 1 -     Tirzepatide ; Inject 12.5 mg into the skin once a week.  Dispense: 6 mL; Refill: 2    Assessment and  Plan Assessment & Plan Hemorrhoids Chronic hemorrhoids with exacerbations causing discomfort, tenesmus, and bleeding. Large external hemorrhoids complicate bowel movements. Discussed hemorrhoidectomy options, including office-based procedures with minimal recovery. - Prescribe nitroglycerin  ointment for pain relief and size reduction. Patient did not pick up due to cost - Refer to gastroenterology for evaluation and potential office-based hemorrhoidectomy. Consider alternative specialist if unavailable. - Advise high fiber intake and adequate hydration. - Consider Desitin with zinc for irritation and witch hazel for cleaning. Given NTG rectal ointment to use qid prn and referred back to gastroenterology - Order CBC to check for anemia.  Chronic Constipation Chronic constipation managed with Linzess , but severe episodes persist. Discussed daily Linzess  and fiber supplementation. - Continue Linzess , consider daily use. - Recommend fiber supplementation, such as Friendly Fiber. - Ensure adequate hydration.  Trigger Finger Symptoms consistent with trigger finger affecting daily activities. - Contact orthopedic specialist for evaluation and management.     Randie Bustle, MD

## 2024-05-05 NOTE — Patient Instructions (Addendum)
 Consider Friendly Fiber at [email or web address] which is Oat bran and apple pectin  Hemorrhoids Hemorrhoids are swollen veins in and around the rectum or the opening of the butt (anus). There are two types of hemorrhoids: Internal. These occur in the veins just inside the rectum. They may poke through to the outside and become irritated and painful. External. These occur in the veins outside the anus. They can be felt as a painful swelling or hard lump near the anus. Most hemorrhoids do not cause severe problems. Often, they can be treated at home with diet and lifestyle changes. If home treatments do not help, you may need a procedure to shrink or remove the hemorrhoids. What are the causes? Hemorrhoids are caused by pressure near the anus. This pressure may be caused by: Constipation or diarrhea. Straining to poop. Pregnancy. Obesity. Sitting or riding a bike for a long time. Heavy lifting or other things that cause you to strain. Anal sex. What are the signs or symptoms? Symptoms of this condition include: Pain. Anal itching or irritation. Bleeding from the rectum. Leakage of poop (stool). Swelling of the anus. One or more lumps around the anus. How is this diagnosed? Hemorrhoids can often be diagnosed through a visual exam. Other exams or tests may also be done, such as: A digital rectal exam. This is when your health care provider feels inside your rectum with a gloved finger. Anoscope. This is an exam of the anus using a small tube. A blood test, if you have lost a lot of blood. A sigmoidoscopy or colonoscopy. These are tests to look inside the colon using a tube with a camera on the end. How is this treated? In most cases, hemorrhoids can be treated at home with diet and lifestyle changes. If these changes do not help, you may need to have a procedure done. These procedures can make the hemorrhoids smaller or fully remove them. Common procedures include: Rubber band ligation. Rubber  bands are placed at the base of the hemorrhoids to cut off their blood supply. Sclerotherapy. Medicine is put into the hemorrhoids to shrink them. Infrared coagulation. A type of light energy is used to get rid of the hemorrhoids. Hemorrhoidectomy surgery. The hemorrhoids are removed during surgery. Then, the veins that supply them are tied off. Stapled hemorrhoidopexy surgery. The base of the hemorrhoid is stapled to the wall of the rectum. Follow these instructions at home: Medicines Take over-the-counter and prescription medicines only as told by your provider. Use medicated creams or medicines that are put in the rectum (suppositories) as told by your provider. Eating and drinking  Eat foods that are high in fiber, such as beans, whole grains, and fresh fruits and vegetables. Ask your provider about taking products that have fiber added to them (fiber supplements). Reduce the amount of fat in your diet. You can do this by eating low-fat dairy products, eating less red meat, and avoiding processed foods. Drink enough fluid to keep your pee (urine) pale yellow. Managing pain and swelling  Take warm sitz baths for 20 minutes, 3-4 times a day. This can help ease pain and discomfort. You may do this in a bathtub or you can use a portable sitz bath that fits over the toilet. If told, put ice on the affected area. It may help to use ice packs between sitz baths. Put ice in a plastic bag. Place a towel between your skin and the bag. Leave the ice on for 20 minutes, 2-3 times a day.  If your skin turns bright red, remove the ice right away to prevent skin damage. The risk of damage is higher if you cannot feel pain, heat, or cold. General instructions Exercise. Ask your provider how much and what kind of exercise is best for you. In general, you should do moderate exercise for at least 30 minutes on most days of the week (150 minutes each week). You may want to try walking, biking, or yoga. Go to  the bathroom when you have the urge to poop. Do not wait. Avoid straining to poop. Keep the anus dry and clean. Use wet toilet paper or moist towelettes after you poop. Do not sit on the toilet for a long time. This can increase blood pooling and pain. Where to find more information [employer] of Diabetes and Digestive and Kidney Diseases: [employer] Contact a health care provider if: You have more pain and swelling that do not get better with treatment. You have trouble pooping or you are not able to poop. You have pain or inflammation outside the area of the hemorrhoids. Get help right away if: You are bleeding from your rectum and you cannot get it to stop. This information is not intended to replace advice given to you by your health care provider. Make sure you discuss any questions you have with your health care provider. Document Revised: 07/30/2022 Document Reviewed: 07/30/2022 Elsevier Patient Education  2024 [employer].

## 2024-05-05 NOTE — Assessment & Plan Note (Signed)
 Tolerating statin, encouraged heart healthy diet, avoid trans fats, minimize simple carbs and saturated fats. Increase exercise as tolerated

## 2024-05-05 NOTE — Telephone Encounter (Signed)
 Copied from CRM [id number]. Topic: Clinical - Medication Question >> May 05, 2024  2:16 PM Taleah C wrote: Reason for CRM: Bonnie Dickson pharmacist called and stated that there are two instructions on the ointment medication, Nitroglycerin ,  directions reads 1 inch 4x daily, and then 1inch entered every 12 hours. She stated that their system alerts that it should be every 12 hrs. Please clarify at [phone].

## 2024-05-06 ENCOUNTER — Other Ambulatory Visit (HOSPITAL_COMMUNITY): Payer: Self-pay

## 2024-05-06 LAB — CBC WITH DIFFERENTIAL/PLATELET
Basophils Absolute: 0.1 10*3/uL (ref 0.0–0.1)
Basophils Relative: 1.4 % (ref 0.0–3.0)
Eosinophils Absolute: 0.3 10*3/uL (ref 0.0–0.7)
Eosinophils Relative: 4.1 % (ref 0.0–5.0)
HCT: 40.3 % (ref 36.0–46.0)
Hemoglobin: 13.5 g/dL (ref 12.0–15.0)
Lymphocytes Relative: 36.7 % (ref 12.0–46.0)
Lymphs Abs: 2.7 10*3/uL (ref 0.7–4.0)
MCHC: 33.5 g/dL (ref 30.0–36.0)
MCV: 94.6 fl (ref 78.0–100.0)
Monocytes Absolute: 0.6 10*3/uL (ref 0.1–1.0)
Monocytes Relative: 8.8 % (ref 3.0–12.0)
Neutro Abs: 3.6 10*3/uL (ref 1.4–7.7)
Neutrophils Relative %: 49 % (ref 43.0–77.0)
Platelets: 429 10*3/uL — ABNORMAL HIGH (ref 150.0–400.0)
RBC: 4.26 Mil/uL (ref 3.87–5.11)
RDW: 13.9 % (ref 11.5–15.5)
WBC: 7.4 10*3/uL (ref 4.0–10.5)

## 2024-05-07 ENCOUNTER — Ambulatory Visit: Payer: Self-pay | Admitting: Family Medicine

## 2024-05-09 ENCOUNTER — Other Ambulatory Visit (HOSPITAL_COMMUNITY): Payer: Self-pay

## 2024-05-09 MED ORDER — PRAMOXINE HCL (PERIANAL) 1 % EX FOAM
1.0000 | Freq: Three times a day (TID) | CUTANEOUS | 0 refills | Status: DC | PRN
  Filled 2024-05-09: qty 15, 5d supply, fill #0

## 2024-05-09 NOTE — Telephone Encounter (Signed)
 Cost was to expensive ointment.  Pt notified proctofoam was sent in.

## 2024-05-09 NOTE — Addendum Note (Signed)
 Addended by: Marylou Sobers D on: 05/09/2024 11:46 AM   Modules accepted: Orders

## 2024-05-10 ENCOUNTER — Emergency Department (HOSPITAL_BASED_OUTPATIENT_CLINIC_OR_DEPARTMENT_OTHER)
Admission: EM | Admit: 2024-05-10 | Discharge: 2024-05-11 | Disposition: A | Discharge status: Home / Self Care | Attending: Emergency Medicine | Admitting: Emergency Medicine

## 2024-05-10 ENCOUNTER — Encounter (HOSPITAL_BASED_OUTPATIENT_CLINIC_OR_DEPARTMENT_OTHER): Payer: Self-pay

## 2024-05-10 ENCOUNTER — Other Ambulatory Visit (HOSPITAL_COMMUNITY): Payer: Self-pay

## 2024-05-10 ENCOUNTER — Other Ambulatory Visit (HOSPITAL_BASED_OUTPATIENT_CLINIC_OR_DEPARTMENT_OTHER): Payer: Self-pay

## 2024-05-10 DIAGNOSIS — I10 Essential (primary) hypertension: Secondary | ICD-10-CM | POA: Insufficient documentation

## 2024-05-10 DIAGNOSIS — Z7982 Long term (current) use of aspirin: Secondary | ICD-10-CM | POA: Diagnosis not present

## 2024-05-10 DIAGNOSIS — Z79899 Other long term (current) drug therapy: Secondary | ICD-10-CM | POA: Diagnosis not present

## 2024-05-10 DIAGNOSIS — F17200 Nicotine dependence, unspecified, uncomplicated: Secondary | ICD-10-CM | POA: Insufficient documentation

## 2024-05-10 DIAGNOSIS — I2510 Atherosclerotic heart disease of native coronary artery without angina pectoris: Secondary | ICD-10-CM | POA: Diagnosis not present

## 2024-05-10 DIAGNOSIS — E119 Type 2 diabetes mellitus without complications: Secondary | ICD-10-CM | POA: Diagnosis not present

## 2024-05-10 DIAGNOSIS — Z794 Long term (current) use of insulin: Secondary | ICD-10-CM | POA: Insufficient documentation

## 2024-05-10 DIAGNOSIS — K5909 Other constipation: Secondary | ICD-10-CM | POA: Diagnosis not present

## 2024-05-10 DIAGNOSIS — R109 Unspecified abdominal pain: Secondary | ICD-10-CM | POA: Diagnosis not present

## 2024-05-10 LAB — LIPASE, BLOOD: Lipase: 26 U/L (ref 11–51)

## 2024-05-10 LAB — CBC
HCT: 39.4 % (ref 36.0–46.0)
Hemoglobin: 13.2 g/dL (ref 12.0–15.0)
MCH: 31.9 pg (ref 26.0–34.0)
MCHC: 33.5 g/dL (ref 30.0–36.0)
MCV: 95.2 fL (ref 80.0–100.0)
Platelets: 379 10*3/uL (ref 150–400)
RBC: 4.14 MIL/uL (ref 3.87–5.11)
RDW: 13.8 % (ref 11.5–15.5)
WBC: 8.5 10*3/uL (ref 4.0–10.5)
nRBC: 0 % (ref 0.0–0.2)

## 2024-05-10 LAB — URINALYSIS, ROUTINE W REFLEX MICROSCOPIC
Bilirubin Urine: NEGATIVE
Glucose, UA: NEGATIVE mg/dL
Hgb urine dipstick: NEGATIVE
Ketones, ur: NEGATIVE mg/dL
Leukocytes,Ua: NEGATIVE
Nitrite: NEGATIVE
Protein, ur: NEGATIVE mg/dL
Specific Gravity, Urine: 1.005 (ref 1.005–1.030)
pH: 6 (ref 5.0–8.0)

## 2024-05-10 LAB — COMPREHENSIVE METABOLIC PANEL WITH GFR
ALT: 11 U/L (ref 0–44)
AST: 16 U/L (ref 15–41)
Albumin: 4.2 g/dL (ref 3.5–5.0)
Alkaline Phosphatase: 96 U/L (ref 38–126)
Anion gap: 13 (ref 5–15)
BUN: 7 mg/dL (ref 6–20)
CO2: 24 mmol/L (ref 22–32)
Calcium: 9.8 mg/dL (ref 8.9–10.3)
Chloride: 101 mmol/L (ref 98–111)
Creatinine, Ser: 0.69 mg/dL (ref 0.44–1.00)
GFR, Estimated: >60 mL/min (ref >60)
Glucose, Bld: 120 mg/dL — ABNORMAL HIGH (ref 70–99)
Potassium: 3.8 mmol/L (ref 3.5–5.1)
Sodium: 137 mmol/L (ref 135–145)
Total Bilirubin: 0.3 mg/dL (ref 0.0–1.2)
Total Protein: 7.9 g/dL (ref 6.5–8.1)

## 2024-05-10 MED ORDER — POLYETHYLENE GLYCOL 3350 17 GM/SCOOP PO POWD
ORAL | 0 refills | Status: DC
  Filled 2024-05-10: qty 238, 10d supply, fill #0

## 2024-05-10 NOTE — ED Notes (Signed)
 Pt endorses rectal bleeding, bleeding when passing gas.

## 2024-05-10 NOTE — ED Provider Notes (Signed)
 [place] EMERGENCY DEPARTMENT AT [facility] Provider Note  CSN: [id number] Arrival date & time: 05/10/24 2014  Chief Complaint(s) Constipation and Abdominal Pain  HPI Bonnie Dickson is a 53 y.o. female {Add pertinent medical, surgical, social history, OB history to HPI:1}    Constipation Severity:  Moderate Timing:  Constant Progression:  Waxing and waning Chronicity:  Chronic Associated symptoms: abdominal pain   Abdominal Pain Associated symptoms: constipation     Past Medical History Past Medical History:  Diagnosis Date   Arthritis    lower back   Blood transfusion without reported diagnosis    CAD (coronary artery disease)    a. NSTEMI 03/2021 s/p DES to LAD.   Depression    situational   Diabetes mellitus without complication (HCC)    Endometriosis    GERD (gastroesophageal reflux disease)    History of chicken pox    HSV-2 (herpes simplex virus 2) infection    Hyperglycemia 07/24/2019   Hyperlipidemia    Hypertension    Insomnia    Myocardial infarction (HCC) 03/31/2021   Snoring    Tonsillitis and adenoiditis, chronic    childhood, caused adult snoring   Patient Active Problem List   Diagnosis Date Noted   Constipation 08/24/2023   Pap smear abnormality of cervix with ASCUS favoring benign 01/01/2022   Left hip pain 01/01/2022   Tobacco dependence 12/31/2021   Abnormal TSH 04/02/2021   Cardiomyopathy (HCC)    CAD (coronary artery disease)    NSTEMI (non-ST elevated myocardial infarction) (HCC) 03/31/2021   Chest pain, rule out acute myocardial infarction 03/25/2021   Preventative health care 10/19/2019   Hypertension 07/25/2019   Diabetes mellitus, labile (HCC)    Hyperlipidemia LDL goal <70    HSV-2 (herpes simplex virus 2) infection    Insomnia    Endometriosis    History of IBS 07/18/2019   Snoring    History of chicken pox    Depression with anxiety    Home Medication(s) Prior to Admission medications   Medication Sig  Start Date End Date Taking? Authorizing Provider  polyethylene glycol powder (MIRALAX ) 17 GM/SCOOP powder Please take 6 capfuls of MiraLAX  in a 32 oz bottle of Gatorade over 2-4 hour period. If no bowel movement, the following day take 3 capfuls in a 16 oz drink. Once you have a bowel movement, start taking 1 capful 1-3 times a day and slowly cut back as needed until you have normal bowel movements. You may stop at any point if you are having too much watery stools (more than 3 in a day). 05/10/24  Yes Alekai Pocock, Camila Cecil, MD  amLODipine  (NORVASC ) 10 MG tablet Take 1 tablet (10 mg total) by mouth daily. 12/01/23   West, Katlyn D, NP  aspirin  EC (ASPIRIN  LOW DOSE) 81 MG tablet Take 1 tablet (81 mg total) by mouth daily. Swallow whole. 10/09/23   Neda Balk, MD  carvedilol  (COREG ) 6.25 MG tablet Take 1 tablet (6.25 mg total) by mouth 2 (two) times daily with a meal. 12/01/23   West, Katlyn D, NP  hydrOXYzine  (ATARAX ) 10 MG tablet Take 1-2 tablets (10-20 mg total) by mouth every 8 (eight) hours as needed. 08/24/23   Neda Balk, MD  insulin  lispro (HUMALOG  KWIKPEN) 100 UNIT/ML KwikPen Max 30 Units daily per correction scale 09/18/22   Shamleffer, Ibtehal Jaralla, MD  Insulin  Pen Needle 32G X 4 MM MISC Use as directed with Humalog  up to 4 times a day; in the morning, at  noon, in the evening, and at bedtime. 08/06/21   Shamleffer, Ibtehal Jaralla, MD  linaclotide  (LINZESS ) 145 MCG CAPS capsule Take 1 capsule (145 mcg total) by mouth daily before breakfast. 05/05/24   Neda Balk, MD  losartan  (COZAAR ) 25 MG tablet Take 1 tablet (25 mg total) by mouth daily. 09/03/23   Neda Balk, MD  nitroGLYCERIN  (NITROSTAT ) 0.4 MG SL tablet Place 1 tablet (0.4 mg total) under the tongue every 5 (five) minutes as needed for chest pain (up to 3 doses). 07/09/23   Hilty, Aviva Lemmings, MD  Nitroglycerin  0.4 % OINT Apply 1 inch (375 mg) ointment intra-anally every 12 hours for anal fissure to start, may increase to 4  times daily as needed. 05/05/24   Neda Balk, MD  pramoxine (PROCTOFOAM) 1 % foam Place 1 Application rectally 3 (three) times daily as needed for hemorrhoids. 05/09/24   Neda Balk, MD  rosuvastatin  (CRESTOR ) 40 MG tablet Take 1 tablet (40 mg total) by mouth daily. 12/01/23 03/03/24  West, Katlyn D, NP  tirzepatide  (MOUNJARO ) 12.5 MG/0.5ML Pen Inject 12.5 mg into the skin once a week. 05/05/24   Neda Balk, MD  tiZANidine  (ZANAFLEX ) 2 MG tablet Take 0.5-2 tablets (1-4 mg total) by mouth 2 (two) times daily as needed for muscle spasms. 09/14/23   Neda Balk, MD  valACYclovir  (VALTREX ) 500 MG tablet Take 1 tablet (500 mg total) by mouth 2 (two) times daily. 05/02/24   Neda Balk, MD  varenicline  (CHANTIX  CONTINUING MONTH PAK) 1 MG tablet Take 1 tablet (1 mg total) by mouth 2 (two) times daily. 08/24/23   Neda Balk, MD                                                                                                                                    Allergies Ambien [zolpidem], Atorvastatin , Ciprofloxacin hcl, Gentamicin, Penicillins, Ambien [zolpidem tartrate], Asa [aspirin ], Banana, Ivp dye [iodinated contrast media], Lisinopril, Metformin and related, Nsaids, Pineapple, Tape, Codeine, Flagyl [metronidazole], and Morphine and codeine  Review of Systems Review of Systems  Gastrointestinal:  Positive for abdominal pain and constipation.   As noted in HPI  Physical Exam Vital Signs  I have reviewed the triage vital signs BP (!) 157/86 (BP Location: Right Arm)   Pulse 71   Temp 98.1 F (36.7 C) (Oral)   Resp 18   Ht 5\' 7"  (1.702 m)   Wt 92.5 kg   SpO2 100%   BMI 31.95 kg/m  *** Physical Exam Vitals reviewed.  Constitutional:      General: She is not in acute distress.    Appearance: She is well-developed. She is not diaphoretic.  HENT:     Head: Normocephalic and atraumatic.     Right Ear: External ear normal.     Left Ear: External ear normal.     Nose: Nose  normal.  Eyes:  General: No scleral icterus.    Conjunctiva/sclera: Conjunctivae normal.  Neck:     Trachea: Phonation normal.  Cardiovascular:     Rate and Rhythm: Normal rate and regular rhythm.  Pulmonary:     Effort: Pulmonary effort is normal. No respiratory distress.     Breath sounds: No stridor.  Abdominal:     General: There is no distension.     Tenderness: There is no abdominal tenderness.  Musculoskeletal:        General: Normal range of motion.     Cervical back: Normal range of motion.  Neurological:     Mental Status: She is alert and oriented to person, place, and time.  Psychiatric:        Behavior: Behavior normal.     ED Results and Treatments Labs (all labs ordered are listed, but only abnormal results are displayed) Labs Reviewed  COMPREHENSIVE METABOLIC PANEL WITH GFR - Abnormal; Notable for the following components:      Result Value   Glucose, Bld 120 (*)    All other components within normal limits  URINALYSIS, ROUTINE W REFLEX MICROSCOPIC - Abnormal; Notable for the following components:   Color, Urine COLORLESS (*)    All other components within normal limits  LIPASE, BLOOD  CBC                                                                                                                         EKG  EKG Interpretation Date/Time:    Ventricular Rate:    PR Interval:    QRS Duration:    QT Interval:    QTC Calculation:   R Axis:      Text Interpretation:         Radiology No results found.  Medications Ordered in ED Medications - No data to display Procedures Procedures  (including critical care time) Medical Decision Making / ED Course   Medical Decision Making Amount and/or Complexity of Data Reviewed Labs: ordered. Decision-making details documented in ED Course.    Abd pain consistent with constipation. ***    Final Clinical Impression(s) / ED Diagnoses Final diagnoses:  Other constipation    This chart  was dictated using voice recognition software.  Despite best efforts to proofread,  errors can occur which can change the documentation meaning.

## 2024-05-10 NOTE — ED Triage Notes (Signed)
 Pt reports large internal/external hemorrhoids, worsening since January. Concerned hemorrhoids are obstructing BM, last BM yesterday, reports 'just liquid, felt like it was seeping around something'. Attempted otc stool softeners and enemas w/ no relief. Has referral for GI for surgical removal but pain is severe. Pt reports 'I can feel the peristalsis taking place but I'm not having a BM'. Endorses nausea, -V

## 2024-05-11 ENCOUNTER — Other Ambulatory Visit (HOSPITAL_BASED_OUTPATIENT_CLINIC_OR_DEPARTMENT_OTHER): Payer: Self-pay

## 2024-05-11 NOTE — ED Notes (Signed)
 RN reviewed discharge instructions with pt. Pt verbalized understanding and had no further questions. VSS upon discharge.

## 2024-05-12 ENCOUNTER — Ambulatory Visit: Admitting: Family Medicine

## 2024-05-13 ENCOUNTER — Encounter: Payer: Self-pay | Admitting: Physician Assistant

## 2024-05-22 NOTE — Assessment & Plan Note (Signed)
 Tolerating statin, encouraged heart healthy diet, avoid trans fats, minimize simple carbs and saturated fats. Increase exercise as tolerated

## 2024-05-22 NOTE — Assessment & Plan Note (Signed)
 Encouraged good sleep hygiene such as dark, quiet room. No blue/green glowing lights such as computer screens in bedroom. No alcohol or stimulants in evening. Cut down on caffeine as able. Regular exercise is helpful but not just prior to bed time.

## 2024-05-22 NOTE — Assessment & Plan Note (Signed)
 Well controlled, no changes to meds. Encouraged heart healthy diet such as the DASH diet and exercise as tolerated.

## 2024-05-22 NOTE — Assessment & Plan Note (Signed)
 Mounjaro  to 12.5 mg weekly

## 2024-05-24 ENCOUNTER — Other Ambulatory Visit (HOSPITAL_BASED_OUTPATIENT_CLINIC_OR_DEPARTMENT_OTHER): Payer: Self-pay

## 2024-05-24 ENCOUNTER — Encounter: Payer: Self-pay | Admitting: Family Medicine

## 2024-05-24 ENCOUNTER — Other Ambulatory Visit: Payer: Self-pay

## 2024-05-24 ENCOUNTER — Telehealth (INDEPENDENT_AMBULATORY_CARE_PROVIDER_SITE_OTHER): Admitting: Family Medicine

## 2024-05-24 VITALS — Ht 67.0 in | Wt 197.2 lb

## 2024-05-24 DIAGNOSIS — G4700 Insomnia, unspecified: Secondary | ICD-10-CM

## 2024-05-24 DIAGNOSIS — E785 Hyperlipidemia, unspecified: Secondary | ICD-10-CM

## 2024-05-24 DIAGNOSIS — I10 Essential (primary) hypertension: Secondary | ICD-10-CM | POA: Diagnosis not present

## 2024-05-24 DIAGNOSIS — E109 Type 1 diabetes mellitus without complications: Secondary | ICD-10-CM | POA: Diagnosis not present

## 2024-05-24 MED ORDER — HYOSCYAMINE SULFATE 0.125 MG SL SUBL
0.1250 mg | SUBLINGUAL_TABLET | SUBLINGUAL | 0 refills | Status: DC | PRN
  Filled 2024-05-24: qty 30, 5d supply, fill #0

## 2024-05-24 MED ORDER — LINACLOTIDE 290 MCG PO CAPS
290.0000 ug | ORAL_CAPSULE | Freq: Every day | ORAL | 3 refills | Status: DC
  Filled 2024-05-24 – 2024-10-25 (×3): qty 30, 30d supply, fill #0

## 2024-05-24 NOTE — Progress Notes (Signed)
 MyChart Video Visit    Virtual Visit via Video Note   This patient is at least at moderate risk for complications without adequate follow up. This format is felt to be most appropriate for this patient at this time. Physical exam was limited by quality of the video and audio technology used for the visit. Bonnie Dickson, CMA was able to get the patient set up on a video visit.  Patient location: Home Patient and provider in visit Provider location: Office  I discussed the limitations of evaluation and management by telemedicine and the availability of in person appointments. The patient expressed understanding and agreed to proceed.  Visit Date: 05/24/2024  Today's healthcare provider: Harlene Horton, MD     Subjective:    Patient ID: Bonnie Dickson, female    DOB: 1971/02/03, 53 y.o.   MRN: [id number]  Chief Complaint  Patient presents with   Medical Management of Chronic Issues    Patient presents today for a 3 week follow-up.   Quality Metric Gaps    Eye exam, foot exam,Hep B vaccine, zoster    HPI Discussed the use of AI scribe software for clinical note transcription with the patient, who gave verbal consent to proceed.  History of Present Illness Bonnie Dickson is a 53 year old female who presents with severe constipation and rectal bleeding.  She has experienced a significant exacerbation of her hemorrhoids, leading to severe constipation. The hemorrhoids are so enlarged that she can feel stool digitally but is unable to pass it. She visited the ER last week due to these symptoms and was advised to take Miralax  three times a day. Despite taking Miralax , her bowel movements are liquid, and she can still feel stool that is not rock hard but difficult to pass due to the small opening.  She has been using hemorrhoid suppositories, but the hemorrhoids have become so enlarged that suppositories are the only thing she can insert. She has tried Dulcolax suppositories and enemas  without relief, describing the stool as 'almost like putting your finger in Play-Doh' where everything moves but nothing comes out. She experiences bleeding with bowel movements, and the commode is often full of blood, making it difficult to discern if it is stool or hemorrhoid tissue. She also reports increased clotting and irritation with more frequent bowel movements.  She experiences abdominal pain and nausea, which she associates with the pain. No fevers or chills. Her current medications include Linzess , which she takes daily, but she notes it has become very expensive. She has a Linzess  card to potentially reduce the cost. She also mentions starting a new dose of Mounjaro  next week for her diabetes management.  Her blood sugar levels have been slightly elevated due to the stress and pain, with recent readings around 150 mg/dL, up from her usual 889-879 mg/dL.    Past Medical History:  Diagnosis Date   Arthritis    lower back   Blood transfusion without reported diagnosis    CAD (coronary artery disease)    a. NSTEMI 03/2021 s/p DES to LAD.   Depression    situational   Diabetes mellitus without complication (HCC)    Endometriosis    GERD (gastroesophageal reflux disease)    History of chicken pox    HSV-2 (herpes simplex virus 2) infection    Hyperglycemia 07/24/2019   Hyperlipidemia    Hypertension    Insomnia    Myocardial infarction (HCC) 03/31/2021   Snoring    Tonsillitis and adenoiditis, chronic  childhood, caused adult snoring    Past Surgical History:  Procedure Laterality Date   ABDOMINAL HYSTERECTOMY     ovaries left in place   BREAST CYST EXCISION Right    35 years ago    COMBINED ABDOMINOPLASTY AND LIPOSUCTION     CORONARY STENT INTERVENTION N/A 04/01/2021   Procedure: CORONARY STENT INTERVENTION;  Surgeon: Anner Alm ORN, MD;  Location: [facility] INVASIVE CV LAB;  Service: Cardiovascular;  Laterality: N/A;   ENDOMETRIAL ABLATION     ENDOMETRIAL BIOPSY      LAPAROSCOPIC ENDOMETRIOSIS FULGURATION     LEFT HEART CATH AND CORONARY ANGIOGRAPHY N/A 04/01/2021   Procedure: LEFT HEART CATH AND CORONARY ANGIOGRAPHY;  Surgeon: Anner Alm ORN, MD;  Location: [facility] INVASIVE CV LAB;  Service: Cardiovascular;  Laterality: N/A;   PARTIAL HYSTERECTOMY     vaginal   TUBAL LIGATION      Family History  Problem Relation Age of Onset   Hypertension Mother    Diabetes Mother        prediabetes   Hyperlipidemia Mother    Diabetes Father    Cataracts Father    Eczema Brother    Asthma Brother    Diabetes Maternal Grandmother    Hypertension Maternal Grandmother    Diabetes Maternal Grandfather    Hypertension Maternal Grandfather    Hyperlipidemia Maternal Grandfather    Heart failure Maternal Grandfather    COPD Maternal Grandfather    ODD Son    ADD / ADHD Son    Diabetes Daughter    Hypertension Daughter    Colon cancer Neg Hx    Stomach cancer Neg Hx    Pancreatic cancer Neg Hx    Esophageal cancer Neg Hx     Social History   Socioeconomic History   Marital status: Widowed    Spouse name: Not on file   Number of children: Not on file   Years of education: Not on file   Highest education level: Master's degree (e.g., MA, MS, MEng, MEd, MSW, MBA)  Occupational History   Not on file  Tobacco Use   Smoking status: Former    Current packs/day: 0.00    Average packs/day: 0.5 packs/day for 30.0 years (15.0 ttl pk-yrs)    Types: Cigarettes    Start date: 11/01/1991    Quit date: 10/31/2021    Years since quitting: 2.5   Smokeless tobacco: Never  Vaping Use   Vaping status: Never Used  Substance and Sexual Activity   Alcohol use: Never   Drug use: Never   Sexual activity: Not Currently    Birth control/protection: Surgical  Other Topics Concern   Not on file  Social History Narrative   Not on file   Social Drivers of Health   Financial Resource Strain: Low Risk  (02/22/2024)   Overall Financial Resource Strain (CARDIA)    Difficulty  of Paying Living Expenses: Not hard at all  Food Insecurity: No Food Insecurity (02/22/2024)   Hunger Vital Sign    Worried About Running Out of Food in the Last Year: Never true    Ran Out of Food in the Last Year: Never true  Transportation Needs: No Transportation Needs (02/22/2024)   PRAPARE - Administrator, Civil Service (Medical): No    Lack of Transportation (Non-Medical): No  Physical Activity: Sufficiently Active (02/22/2024)   Exercise Vital Sign    Days of Exercise per Week: 3 days    Minutes of Exercise per Session: 120 min  Stress: Stress Concern Present (02/22/2024)   [employer] of Occupational Health - Occupational Stress Questionnaire    Feeling of Stress : To some extent  Social Connections: Moderately Isolated (02/22/2024)   Social Connection and Isolation Panel    Frequency of Communication with Friends and Family: Three times a week    Frequency of Social Gatherings with Friends and Family: Three times a week    Attends Religious Services: Never    Active Member of Clubs or Organizations: Yes    Attends Banker Meetings: More than 4 times per year    Marital Status: Widowed  Intimate Partner Violence: Not on file    Outpatient Medications Prior to Visit  Medication Sig Dispense Refill   amLODipine  (NORVASC ) 10 MG tablet Take 1 tablet (10 mg total) by mouth daily. 90 tablet 3   aspirin  EC (ASPIRIN  LOW DOSE) 81 MG tablet Take 1 tablet (81 mg total) by mouth daily. Swallow whole. 90 tablet 1   carvedilol  (COREG ) 6.25 MG tablet Take 1 tablet (6.25 mg total) by mouth 2 (two) times daily with a meal. 180 tablet 3   hydrOXYzine  (ATARAX ) 10 MG tablet Take 1-2 tablets (10-20 mg total) by mouth every 8 (eight) hours as needed. 40 tablet 2   insulin  lispro (HUMALOG  KWIKPEN) 100 UNIT/ML KwikPen Max 30 Units daily per correction scale 6 mL 0   Insulin  Pen Needle 32G X 4 MM MISC Use as directed with Humalog  up to 4 times a day; in the morning, at  noon, in the evening, and at bedtime. 400 each 2   losartan  (COZAAR ) 25 MG tablet Take 1 tablet (25 mg total) by mouth daily. 90 tablet 3   nitroGLYCERIN  (NITROSTAT ) 0.4 MG SL tablet Place 1 tablet (0.4 mg total) under the tongue every 5 (five) minutes as needed for chest pain (up to 3 doses). 25 tablet 3   Nitroglycerin  0.4 % OINT Apply 1 inch (375 mg) ointment intra-anally every 12 hours for anal fissure to start, may increase to 4 times daily as needed. 30 g 1   polyethylene glycol powder (MIRALAX ) 17 GM/SCOOP powder Take 6 capfuls of MiraLAX  in a 32 oz bottle of Gatorade by mouth over 2-4 hour period. If no bowel movement, the following day take 3 capfuls in a 16 oz drink. Once you have a bowel movement, start taking 1 capful 1-3 times a day and slowly cut back as needed until you have normal bowel movements. You may stop at any point if you are having too much watery stools (more than 3 in a day). 238 g 0   pramoxine (PROCTOFOAM) 1 % foam Place 1 Application rectally 3 (three) times daily as needed for hemorrhoids. 15 g 0   rosuvastatin  (CRESTOR ) 40 MG tablet Take 1 tablet (40 mg total) by mouth daily. 90 tablet 3   tirzepatide  (MOUNJARO ) 12.5 MG/0.5ML Pen Inject 12.5 mg into the skin once a week. 6 mL 2   tiZANidine  (ZANAFLEX ) 2 MG tablet Take 0.5-2 tablets (1-4 mg total) by mouth 2 (two) times daily as needed for muscle spasms. 40 tablet 1   valACYclovir  (VALTREX ) 500 MG tablet Take 1 tablet (500 mg total) by mouth 2 (two) times daily. 180 tablet 0   varenicline  (CHANTIX  CONTINUING MONTH PAK) 1 MG tablet Take 1 tablet (1 mg total) by mouth 2 (two) times daily. 60 tablet 5   linaclotide  (LINZESS ) 145 MCG CAPS capsule Take 1 capsule (145 mcg total) by mouth daily before breakfast.  90 capsule 1   No facility-administered medications prior to visit.    Allergies  Allergen Reactions   Ambien [Zolpidem]     Sleep Walking   Atorvastatin      Other reaction(s): myalgias (muscle pain)    Ciprofloxacin Hcl Anaphylaxis   Gentamicin Anaphylaxis   Penicillins Anaphylaxis   Ambien [Zolpidem Tartrate] Other (See Comments)    Sleep walking    Asa [Aspirin ] Other (See Comments)    Rectal bleeding    Banana Itching, Swelling and Other (See Comments)    Tongue itches and swells- breathing not impaired   Ivp Dye [Iodinated Contrast Media] Nausea And Vomiting and Other (See Comments)    Severe vomiting    Lisinopril Cough   Metformin And Related Nausea Only and Other (See Comments)    Severe GI upset   Nsaids Other (See Comments)    Rectal bleeding    Pineapple Other (See Comments)    Causes a burning sensation in the mouth   Tape Other (See Comments)    Paper tape burns the skin   Codeine Rash   Flagyl [Metronidazole] Rash   Morphine And Codeine Rash    Review of Systems  Constitutional:  Negative for chills, fever and malaise/fatigue.  HENT:  Negative for congestion.   Eyes:  Negative for blurred vision.  Respiratory:  Negative for shortness of breath.   Cardiovascular:  Negative for chest pain, palpitations and leg swelling.  Gastrointestinal:  Positive for abdominal pain, blood in stool, constipation, diarrhea and nausea. Negative for heartburn, melena and vomiting.  Genitourinary:  Negative for dysuria and frequency.  Musculoskeletal:  Negative for falls.  Skin:  Negative for rash.  Neurological:  Negative for dizziness, loss of consciousness and headaches.  Endo/Heme/Allergies:  Negative for environmental allergies.  Psychiatric/Behavioral:  Negative for depression. The patient is not nervous/anxious.        Objective:    Physical Exam Constitutional:      General: She is not in acute distress.    Appearance: Normal appearance. She is well-developed. She is not toxic-appearing.  HENT:     Head: Normocephalic and atraumatic.     Right Ear: External ear normal.     Left Ear: External ear normal.     Nose: Nose normal.   Eyes:     General:         Right eye: No discharge.        Left eye: No discharge.     Conjunctiva/sclera: Conjunctivae normal.   Neck:     Thyroid : No thyromegaly.   Cardiovascular:     Rate and Rhythm: Normal rate and regular rhythm.     Heart sounds: Normal heart sounds. No murmur heard. Pulmonary:     Effort: Pulmonary effort is normal. No respiratory distress.     Breath sounds: Normal breath sounds.  Abdominal:     General: Bowel sounds are normal.     Palpations: Abdomen is soft.     Tenderness: There is no abdominal tenderness. There is no guarding.   Musculoskeletal:        General: Normal range of motion.     Cervical back: Neck supple.  Lymphadenopathy:     Cervical: No cervical adenopathy.   Skin:    General: Skin is warm and dry.   Neurological:     Mental Status: She is alert and oriented to person, place, and time.   Psychiatric:        Mood and Affect: Mood normal.  Behavior: Behavior normal.        Thought Content: Thought content normal.        Judgment: Judgment normal.     Ht 5' 7 (1.702 m)   Wt 197 lb 3.2 oz (89.4 kg) Comment: per pt  BMI 30.89 kg/m  Wt Readings from Last 3 Encounters:  05/24/24 197 lb 3.2 oz (89.4 kg)  05/10/24 204 lb (92.5 kg)  05/05/24 204 lb 3.2 oz (92.6 kg)       Assessment & Plan:  Hypertension, unspecified type Assessment & Plan: Well controlled, no changes to meds. Encouraged heart healthy diet such as the DASH diet and exercise as tolerated.    Hyperlipidemia LDL goal <70 Assessment & Plan: Tolerating statin, encouraged heart healthy diet, avoid trans fats, minimize simple carbs and saturated fats. Increase exercise as tolerated    Diabetes mellitus, labile (HCC) Assessment & Plan: Mounjaro  to 12.5 mg weekly   Insomnia, unspecified type Assessment & Plan: Encouraged good sleep hygiene such as dark, quiet room. No blue/green glowing lights such as computer screens in bedroom. No alcohol or stimulants in evening. Cut down on  caffeine as able. Regular exercise is helpful but not just prior to bed time.    Other orders -     Hyoscyamine Sulfate; Place 1 tablet (0.125 mg total) under the tongue every 4 (four) hours as needed.  Dispense: 30 tablet; Refill: 0 -     linaCLOtide ; Take 1 capsule (290 mcg total) by mouth daily before breakfast.  Dispense: 30 capsule; Refill: 3     Assessment and Plan Assessment & Plan Chronic Hemorrhoids and Constipation Severe hemorrhoids causing obstruction and bleeding. Ineffective previous treatments. Abdominal pain and nausea likely from bowel spasms. Discussed hyoscyamine for cramping. Considered Linzess  dosage increase for bowel movement consistency. - Expedite GI appointment  was able to get appt moved to this week - Prescribe hyoscyamine, 1 tablet sublingually every 4 hours as needed. - Increase Linzess  to 290 mcg daily with refills and Linzess  card for insurance coverage. - Monitor for anemia signs: lethargy, shortness of breath, dizziness.  Diabetes Mellitus Slight increase in blood glucose due to stress and pain. New Mounjaro  dose expected to help manage levels. - Start Mounjaro  12.5 mg next week.     I discussed the assessment and treatment plan with the patient. The patient was provided an opportunity to ask questions and all were answered. The patient agreed with the plan and demonstrated an understanding of the instructions.   The patient was advised to call back or seek an in-person evaluation if the symptoms worsen or if the condition fails to improve as anticipated.  Harlene Horton, MD [facility] at [facility] [phone] (phone) [phone] (fax)  [facility]

## 2024-05-27 ENCOUNTER — Ambulatory Visit: Admitting: Internal Medicine

## 2024-05-27 ENCOUNTER — Ambulatory Visit (INDEPENDENT_AMBULATORY_CARE_PROVIDER_SITE_OTHER)

## 2024-05-27 ENCOUNTER — Encounter: Payer: Self-pay | Admitting: Internal Medicine

## 2024-05-27 VITALS — BP 126/80 | HR 86 | Ht 67.0 in | Wt 199.0 lb

## 2024-05-27 DIAGNOSIS — K581 Irritable bowel syndrome with constipation: Secondary | ICD-10-CM

## 2024-05-27 DIAGNOSIS — K5641 Fecal impaction: Secondary | ICD-10-CM | POA: Diagnosis not present

## 2024-05-27 DIAGNOSIS — K644 Residual hemorrhoidal skin tags: Secondary | ICD-10-CM

## 2024-05-27 NOTE — Patient Instructions (Addendum)
 Please go over to [facility] and have an x-ray done.  We are providing you with Linzess  samples.   We have given you a suflave  prep kit. Do 1/2 the prep and if you don't feel cleaned out do the other 1/2 the next day.    If you continue to have issues stop the mounjaro  and let us  and Dr Domenica know.   I appreciate the opportunity to care for you. Lupita Commander, MD, [facility]

## 2024-05-27 NOTE — Progress Notes (Signed)
 Bonnie Dickson 53 y.o. December 14, 1970 [id number]  Assessment & Plan:   Encounter Diagnoses  Name Primary?   Fecal impaction (HCC) Yes   Irritable bowel syndrome with constipation    Bleeding external hemorrhoids     She has an exacerbation of constipation in the setting of IBS with a fecal impaction. Not a candidate for hemorrhoidal ligation as she has external hemorrhoids and due to innervation and pain issues external hemorrhoids are not ligated.  Because of worsening problems could be Mounjaro .  Dose has been increasing since earlier this year and is about to go to 12.5 mg.  My plan is for her to do a purge we have given her Suflave  prep samples and she will do at least one half of the prep kit and repeat if not adequate.  Checking a two-view abdominal film as well.  Preliminary reading by me large amount of stool specially in the left colon consistent with her fecal impaction problems, including stool in the rectum.  Plan to observe to see if treating the impaction allows her to return to her baseline.  If it does not Mounjaro  should be held to see if that is the issue.  I do have some suspicion that it is since she has sat for exacerbations since January and Mounjaro  was increased to 10 mg at that time.  She is about to go to 12.5 mg.  She is using Linzess  every other day and said that was working, she may need to move to every day, samples provided due to high cost of medication.  This may continue to be a barrier to success.  Regarding hemorrhoids if she desires definitive therapy colorectal surgery consultation would be the next step.  Would not pursue that currently given impaction and constipation exacerbation.  CC: Domenica Harlene LABOR, MD Dr. Glendia Holt   Subjective:  Patient consented to the use of artificial intelligence scribe  Chief Complaint: Hemorrhoids  HPI 53 year old woman with a history of IBS-C, followed by Dr. Holt and worked into my schedule at  the request of Dr. Domenica due to worsening constipation and bleeding hemorrhoids.  To be considered for hemorrhoidal ligation.  She has experienced four episodes of severe constipation since January, with the current episode being the most severe. She describes difficulty in passing bowel movements, with stools being mostly liquid, accompanied by blood and clots. Despite using Linzess  every other day, which usually works well, it has recently been ineffective. She has also tried suppositories and enemas with limited success.  She experiences sharp pain during bowel movements and reports abdominal pain associated with the urge to defecate, chills, and nausea. No vomiting. She has a history of IBS-C and notes that her constipation issues predate her use of Ozempic  and Mounjaro . She started Mounjaro  last year after switching from Ozempic , with a dose increase to 10 mg in December. Patient saw Domenica Harlene LABOR, MD on 05/05/2024 with complaints of constipation and hemorrhoids.  She was referred to GI after that.  She is here to be considered for hemorrhoidal ligation.  She has constipation problems managed with Linzess  but severe episodes persist she was in the emergency department on June 10 with constipation issues indicating she cannot afford the Linzess . She had a virtual visit with her primary care provider on June 24, and she reported that she had liquid stools after the emergency department recommended MiraLAX  3 times daily but she was having rectal bleeding still and she was having difficulty defecating due to perception  of a small anorectal opening.  Previously on Ozempic , had abdominal bloating and foul-smelling belching.  Changed to Mounjaro  at some point, 2024 I think.  Dose escalation to 10 mg at the beginning of this year and about to go to 12.5 mg.  Colonoscopy 03/05/2022 Dr. Stacia hemorrhoids found on perianal exam otherwise normal exam.  TSH 1.88 02/22/2024 CMET normal other than glucose 107  that day.  A1c 7.3 CBC was normal June 5, except for platelets 429  Allergies  Allergen Reactions   Ambien [Zolpidem]     Sleep Walking   Atorvastatin      Other reaction(s): myalgias (muscle pain)   Ciprofloxacin Hcl Anaphylaxis   Gentamicin Anaphylaxis   Penicillins Anaphylaxis   Ambien [Zolpidem Tartrate] Other (See Comments)    Sleep walking    Asa [Aspirin ] Other (See Comments)    Rectal bleeding    Banana Itching, Swelling and Other (See Comments)    Tongue itches and swells- breathing not impaired   Ivp Dye [Iodinated Contrast Media] Nausea And Vomiting and Other (See Comments)    Severe vomiting    Lisinopril Cough   Metformin And Related Nausea Only and Other (See Comments)    Severe GI upset   Nsaids Other (See Comments)    Rectal bleeding    Pineapple Other (See Comments)    Causes a burning sensation in the mouth   Tape Other (See Comments)    Paper tape burns the skin   Codeine Rash   Flagyl [Metronidazole] Rash   Morphine And Codeine Rash   Current Meds  Medication Sig   amLODipine  (NORVASC ) 10 MG tablet Take 1 tablet (10 mg total) by mouth daily.   aspirin  EC (ASPIRIN  LOW DOSE) 81 MG tablet Take 1 tablet (81 mg total) by mouth daily. Swallow whole.   carvedilol  (COREG ) 6.25 MG tablet Take 1 tablet (6.25 mg total) by mouth 2 (two) times daily with a meal.   hydrOXYzine  (ATARAX ) 10 MG tablet Take 1-2 tablets (10-20 mg total) by mouth every 8 (eight) hours as needed.   hyoscyamine  (LEVSIN  SL) 0.125 MG SL tablet Place 1 tablet (0.125 mg total) under the tongue every 4 (four) hours as needed.   insulin  lispro (HUMALOG  KWIKPEN) 100 UNIT/ML KwikPen Max 30 Units daily per correction scale   Insulin  Pen Needle 32G X 4 MM MISC Use as directed with Humalog  up to 4 times a day; in the morning, at noon, in the evening, and at bedtime.   linaclotide  (LINZESS ) 290 MCG CAPS capsule Take 1 capsule (290 mcg total) by mouth daily before breakfast.   losartan  (COZAAR ) 25 MG  tablet Take 1 tablet (25 mg total) by mouth daily.   nitroGLYCERIN  (NITROSTAT ) 0.4 MG SL tablet Place 1 tablet (0.4 mg total) under the tongue every 5 (five) minutes as needed for chest pain (up to 3 doses).   polyethylene glycol powder (MIRALAX ) 17 GM/SCOOP powder Take 6 capfuls of MiraLAX  in a 32 oz bottle of Gatorade by mouth over 2-4 hour period. If no bowel movement, the following day take 3 capfuls in a 16 oz drink. Once you have a bowel movement, start taking 1 capful 1-3 times a day and slowly cut back as needed until you have normal bowel movements. You may stop at any point if you are having too much watery stools (more than 3 in a day).   rosuvastatin  (CRESTOR ) 40 MG tablet Take 1 tablet (40 mg total) by mouth daily.   tirzepatide  (MOUNJARO ) 12.5  MG/0.5ML Pen Inject 12.5 mg into the skin once a week.   tiZANidine  (ZANAFLEX ) 2 MG tablet Take 0.5-2 tablets (1-4 mg total) by mouth 2 (two) times daily as needed for muscle spasms.   valACYclovir  (VALTREX ) 500 MG tablet Take 1 tablet (500 mg total) by mouth 2 (two) times daily.   varenicline  (CHANTIX  CONTINUING MONTH PAK) 1 MG tablet Take 1 tablet (1 mg total) by mouth 2 (two) times daily.   Past Medical History:  Diagnosis Date   Arthritis    lower back   Blood transfusion without reported diagnosis    CAD (coronary artery disease)    a. NSTEMI 03/2021 s/p DES to LAD.   Depression    situational   Diabetes mellitus without complication (HCC)    Endometriosis    GERD (gastroesophageal reflux disease)    History of chicken pox    HSV-2 (herpes simplex virus 2) infection    Hyperglycemia 07/24/2019   Hyperlipidemia    Hypertension    Insomnia    Myocardial infarction (HCC) 03/31/2021   Snoring    Tonsillitis and adenoiditis, chronic    childhood, caused adult snoring   Past Surgical History:  Procedure Laterality Date   ABDOMINAL HYSTERECTOMY     ovaries left in place   BREAST CYST EXCISION Right    35 years ago    COMBINED  ABDOMINOPLASTY AND LIPOSUCTION     CORONARY STENT INTERVENTION N/A 04/01/2021   Procedure: CORONARY STENT INTERVENTION;  Surgeon: Anner Alm ORN, MD;  Location: [facility] INVASIVE CV LAB;  Service: Cardiovascular;  Laterality: N/A;   ENDOMETRIAL ABLATION     ENDOMETRIAL BIOPSY     LAPAROSCOPIC ENDOMETRIOSIS FULGURATION     LEFT HEART CATH AND CORONARY ANGIOGRAPHY N/A 04/01/2021   Procedure: LEFT HEART CATH AND CORONARY ANGIOGRAPHY;  Surgeon: Anner Alm ORN, MD;  Location: [facility] INVASIVE CV LAB;  Service: Cardiovascular;  Laterality: N/A;   PARTIAL HYSTERECTOMY     vaginal   TUBAL LIGATION     Social History   Socioeconomic History   Marital status: Widowed    Spouse name: Not on file   Number of children: Not on file   Years of education: Not on file   Highest education level: Master's degree (e.g., MA, MS, MEng, MEd, MSW, MBA)  Occupational History   Not on file  Tobacco Use   Smoking status: Former    Current packs/day: 0.00    Average packs/day: 0.5 packs/day for 30.0 years (15.0 ttl pk-yrs)    Types: Cigarettes    Start date: 11/01/1991    Quit date: 10/31/2021    Years since quitting: 2.5   Smokeless tobacco: Never  Vaping Use   Vaping status: Never Used  Substance and Sexual Activity   Alcohol use: Never   Drug use: Never   Sexual activity: Not Currently    Birth control/protection: Surgical  Other Topics Concern   Not on file  Social History Narrative   Not on file   Social Drivers of Health   Financial Resource Strain: Low Risk  (02/22/2024)   Overall Financial Resource Strain (CARDIA)    Difficulty of Paying Living Expenses: Not hard at all  Food Insecurity: No Food Insecurity (02/22/2024)   Hunger Vital Sign    Worried About Running Out of Food in the Last Year: Never true    Ran Out of Food in the Last Year: Never true  Transportation Needs: No Transportation Needs (02/22/2024)   PRAPARE - Transportation  Lack of Transportation (Medical): No    Lack of  Transportation (Non-Medical): No  Physical Activity: Sufficiently Active (02/22/2024)   Exercise Vital Sign    Days of Exercise per Week: 3 days    Minutes of Exercise per Session: 120 min  Stress: Stress Concern Present (02/22/2024)   [employer] of Occupational Health - Occupational Stress Questionnaire    Feeling of Stress : To some extent  Social Connections: Moderately Isolated (02/22/2024)   Social Connection and Isolation Panel    Frequency of Communication with Friends and Family: Three times a week    Frequency of Social Gatherings with Friends and Family: Three times a week    Attends Religious Services: Never    Active Member of Clubs or Organizations: Yes    Attends Banker Meetings: More than 4 times per year    Marital Status: Widowed  Intimate Partner Violence: Not on file     family history includes ADD / ADHD in her son; Asthma in her brother; COPD in her maternal grandfather; Cataracts in her father; Diabetes in her daughter, father, maternal grandfather, maternal grandmother, and mother; Eczema in her brother; Heart failure in her maternal grandfather; Hyperlipidemia in her maternal grandfather and mother; Hypertension in her daughter, maternal grandfather, maternal grandmother, and mother; ODD in her son.   Review of Systems As per HPI  Objective:   Physical Exam BP 126/80   Pulse 86   Ht 5' 7 (1.702 m)   Wt 199 lb (90.3 kg)   BMI 31.17 kg/m  Middle-aged woman no acute distress The abdomen is obese, it is soft with bowel sounds present.  It is mildly diffusely tender without organomegaly or mass there is a low transverse scar.  Rectal exam performed with female staff present as chaperone  Anoderm shows large prolapsed external hemorrhoids Digital exam shows some mild stenosis but nontender and no induration or suggestion of fissure There is a fecal impaction with a firm large ball of stool in the rectum.  Stool is brown no blood on the  exam finger.  Anoscopy is performed and shows some inflammatory changes of the more proximal external hemorrhoids that are in the anal canal and no significant internal hemorrhoids are appreciated.

## 2024-05-27 NOTE — Progress Notes (Deleted)
 Bonnie Dickson 53 y.o. 10/19/1971 [id number]  Assessment & Plan:      Subjective:   Chief Complaint:  HPI  Allergies  Allergen Reactions   Ambien [Zolpidem]     Sleep Walking   Atorvastatin      Other reaction(s): myalgias (muscle pain)   Ciprofloxacin Hcl Anaphylaxis   Gentamicin Anaphylaxis   Penicillins Anaphylaxis   Ambien [Zolpidem Tartrate] Other (See Comments)    Sleep walking    Asa [Aspirin ] Other (See Comments)    Rectal bleeding    Banana Itching, Swelling and Other (See Comments)    Tongue itches and swells- breathing not impaired   Ivp Dye [Iodinated Contrast Media] Nausea And Vomiting and Other (See Comments)    Severe vomiting    Lisinopril Cough   Metformin And Related Nausea Only and Other (See Comments)    Severe GI upset   Nsaids Other (See Comments)    Rectal bleeding    Pineapple Other (See Comments)    Causes a burning sensation in the mouth   Tape Other (See Comments)    Paper tape burns the skin   Codeine Rash   Flagyl [Metronidazole] Rash   Morphine And Codeine Rash   Current Meds  Medication Sig   amLODipine  (NORVASC ) 10 MG tablet Take 1 tablet (10 mg total) by mouth daily.   aspirin  EC (ASPIRIN  LOW DOSE) 81 MG tablet Take 1 tablet (81 mg total) by mouth daily. Swallow whole.   carvedilol  (COREG ) 6.25 MG tablet Take 1 tablet (6.25 mg total) by mouth 2 (two) times daily with a meal.   hydrOXYzine  (ATARAX ) 10 MG tablet Take 1-2 tablets (10-20 mg total) by mouth every 8 (eight) hours as needed.   hyoscyamine  (LEVSIN  SL) 0.125 MG SL tablet Place 1 tablet (0.125 mg total) under the tongue every 4 (four) hours as needed.   insulin  lispro (HUMALOG  KWIKPEN) 100 UNIT/ML KwikPen Max 30 Units daily per correction scale   Insulin  Pen Needle 32G X 4 MM MISC Use as directed with Humalog  up to 4 times a day; in the morning, at noon, in the evening, and at bedtime.   linaclotide  (LINZESS ) 290 MCG CAPS capsule Take 1 capsule (290 mcg total)  by mouth daily before breakfast.   losartan  (COZAAR ) 25 MG tablet Take 1 tablet (25 mg total) by mouth daily.   nitroGLYCERIN  (NITROSTAT ) 0.4 MG SL tablet Place 1 tablet (0.4 mg total) under the tongue every 5 (five) minutes as needed for chest pain (up to 3 doses).   polyethylene glycol powder (MIRALAX ) 17 GM/SCOOP powder Take 6 capfuls of MiraLAX  in a 32 oz bottle of Gatorade by mouth over 2-4 hour period. If no bowel movement, the following day take 3 capfuls in a 16 oz drink. Once you have a bowel movement, start taking 1 capful 1-3 times a day and slowly cut back as needed until you have normal bowel movements. You may stop at any point if you are having too much watery stools (more than 3 in a day).   rosuvastatin  (CRESTOR ) 40 MG tablet Take 1 tablet (40 mg total) by mouth daily.   tirzepatide  (MOUNJARO ) 12.5 MG/0.5ML Pen Inject 12.5 mg into the skin once a week.   tiZANidine  (ZANAFLEX ) 2 MG tablet Take 0.5-2 tablets (1-4 mg total) by mouth 2 (two) times daily as needed for muscle spasms.   valACYclovir  (VALTREX ) 500 MG tablet Take 1 tablet (500 mg total) by mouth 2 (two) times daily.   varenicline  (CHANTIX  CONTINUING  MONTH PAK) 1 MG tablet Take 1 tablet (1 mg total) by mouth 2 (two) times daily.   Past Medical History:  Diagnosis Date   Arthritis    lower back   Blood transfusion without reported diagnosis    CAD (coronary artery disease)    a. NSTEMI 03/2021 s/p DES to LAD.   Depression    situational   Diabetes mellitus without complication (HCC)    Endometriosis    GERD (gastroesophageal reflux disease)    History of chicken pox    HSV-2 (herpes simplex virus 2) infection    Hyperglycemia 07/24/2019   Hyperlipidemia    Hypertension    Insomnia    Myocardial infarction (HCC) 03/31/2021   Snoring    Tonsillitis and adenoiditis, chronic    childhood, caused adult snoring   Past Surgical History:  Procedure Laterality Date   ABDOMINAL HYSTERECTOMY     ovaries left in place    BREAST CYST EXCISION Right    35 years ago    COMBINED ABDOMINOPLASTY AND LIPOSUCTION     CORONARY STENT INTERVENTION N/A 04/01/2021   Procedure: CORONARY STENT INTERVENTION;  Surgeon: Anner Alm ORN, MD;  Location: [facility] INVASIVE CV LAB;  Service: Cardiovascular;  Laterality: N/A;   ENDOMETRIAL ABLATION     ENDOMETRIAL BIOPSY     LAPAROSCOPIC ENDOMETRIOSIS FULGURATION     LEFT HEART CATH AND CORONARY ANGIOGRAPHY N/A 04/01/2021   Procedure: LEFT HEART CATH AND CORONARY ANGIOGRAPHY;  Surgeon: Anner Alm ORN, MD;  Location: [facility] INVASIVE CV LAB;  Service: Cardiovascular;  Laterality: N/A;   PARTIAL HYSTERECTOMY     vaginal   TUBAL LIGATION     Social History   Social History Narrative   Not on file   family history includes ADD / ADHD in her son; Asthma in her brother; COPD in her maternal grandfather; Cataracts in her father; Diabetes in her daughter, father, maternal grandfather, maternal grandmother, and mother; Eczema in her brother; Heart failure in her maternal grandfather; Hyperlipidemia in her maternal grandfather and mother; Hypertension in her daughter, maternal grandfather, maternal grandmother, and mother; ODD in her son.   Review of Systems   Objective:   Physical Exam

## 2024-06-06 ENCOUNTER — Other Ambulatory Visit (HOSPITAL_BASED_OUTPATIENT_CLINIC_OR_DEPARTMENT_OTHER): Payer: Self-pay

## 2024-06-20 ENCOUNTER — Encounter: Payer: Self-pay | Admitting: Family Medicine

## 2024-06-21 ENCOUNTER — Other Ambulatory Visit: Payer: Self-pay | Admitting: Family Medicine

## 2024-06-21 DIAGNOSIS — K649 Unspecified hemorrhoids: Secondary | ICD-10-CM

## 2024-06-21 MED ORDER — TIRZEPATIDE 2.5 MG/0.5ML ~~LOC~~ SOAJ
2.5000 mg | SUBCUTANEOUS | 1 refills | Status: DC
  Filled 2024-06-21: qty 2, 28d supply, fill #0

## 2024-06-22 ENCOUNTER — Other Ambulatory Visit (HOSPITAL_COMMUNITY): Payer: Self-pay

## 2024-06-22 ENCOUNTER — Other Ambulatory Visit: Payer: Self-pay

## 2024-06-22 ENCOUNTER — Other Ambulatory Visit (HOSPITAL_BASED_OUTPATIENT_CLINIC_OR_DEPARTMENT_OTHER): Payer: Self-pay

## 2024-06-22 MED ORDER — ASPIRIN 81 MG PO TBEC
81.0000 mg | DELAYED_RELEASE_TABLET | Freq: Every day | ORAL | 1 refills | Status: AC
  Filled 2024-06-22 – 2024-06-28 (×2): qty 90, 90d supply, fill #0
  Filled 2024-09-22: qty 90, 90d supply, fill #1

## 2024-06-28 ENCOUNTER — Other Ambulatory Visit (HOSPITAL_BASED_OUTPATIENT_CLINIC_OR_DEPARTMENT_OTHER): Payer: Self-pay

## 2024-06-28 ENCOUNTER — Other Ambulatory Visit (HOSPITAL_COMMUNITY): Payer: Self-pay

## 2024-06-28 ENCOUNTER — Ambulatory Visit: Admitting: Physician Assistant

## 2024-07-01 ENCOUNTER — Other Ambulatory Visit (HOSPITAL_BASED_OUTPATIENT_CLINIC_OR_DEPARTMENT_OTHER): Payer: Self-pay

## 2024-07-01 DIAGNOSIS — K644 Residual hemorrhoidal skin tags: Secondary | ICD-10-CM | POA: Diagnosis not present

## 2024-07-01 MED ORDER — DIBUCAINE (PERIANAL) 1 % EX OINT
1.0000 | TOPICAL_OINTMENT | Freq: Two times a day (BID) | CUTANEOUS | 0 refills | Status: AC | PRN
  Filled 2024-07-01: qty 56, 28d supply, fill #0

## 2024-07-01 MED ORDER — HYDROCORTISONE (PERIANAL) 2.5 % EX CREA
1.0000 | TOPICAL_CREAM | Freq: Three times a day (TID) | CUTANEOUS | 0 refills | Status: AC
  Filled 2024-07-01: qty 30, 10d supply, fill #0

## 2024-07-04 ENCOUNTER — Other Ambulatory Visit (HOSPITAL_BASED_OUTPATIENT_CLINIC_OR_DEPARTMENT_OTHER): Payer: Self-pay

## 2024-07-11 ENCOUNTER — Other Ambulatory Visit: Payer: Self-pay | Admitting: Internal Medicine

## 2024-07-12 ENCOUNTER — Encounter (HOSPITAL_BASED_OUTPATIENT_CLINIC_OR_DEPARTMENT_OTHER): Payer: Self-pay

## 2024-07-12 ENCOUNTER — Other Ambulatory Visit (HOSPITAL_BASED_OUTPATIENT_CLINIC_OR_DEPARTMENT_OTHER): Payer: Self-pay

## 2024-07-22 ENCOUNTER — Other Ambulatory Visit (HOSPITAL_BASED_OUTPATIENT_CLINIC_OR_DEPARTMENT_OTHER): Payer: Self-pay

## 2024-07-25 ENCOUNTER — Other Ambulatory Visit (HOSPITAL_BASED_OUTPATIENT_CLINIC_OR_DEPARTMENT_OTHER): Payer: Self-pay

## 2024-07-25 ENCOUNTER — Encounter: Payer: Self-pay | Admitting: Family Medicine

## 2024-07-25 ENCOUNTER — Other Ambulatory Visit: Payer: Self-pay | Admitting: Family Medicine

## 2024-07-25 MED ORDER — TIRZEPATIDE 5 MG/0.5ML ~~LOC~~ SOAJ
5.0000 mg | SUBCUTANEOUS | 1 refills | Status: DC
  Filled 2024-07-25: qty 6, 84d supply, fill #0
  Filled 2024-10-23: qty 6, 84d supply, fill #1

## 2024-08-08 ENCOUNTER — Other Ambulatory Visit (HOSPITAL_COMMUNITY): Payer: Self-pay

## 2024-08-08 ENCOUNTER — Other Ambulatory Visit (HOSPITAL_BASED_OUTPATIENT_CLINIC_OR_DEPARTMENT_OTHER): Payer: Self-pay

## 2024-08-25 ENCOUNTER — Emergency Department (HOSPITAL_COMMUNITY)

## 2024-08-25 ENCOUNTER — Emergency Department (HOSPITAL_COMMUNITY)
Admission: EM | Admit: 2024-08-25 | Discharge: 2024-08-26 | Discharge status: Against Medical Advice | Attending: Emergency Medicine | Admitting: Emergency Medicine

## 2024-08-25 DIAGNOSIS — R079 Chest pain, unspecified: Secondary | ICD-10-CM | POA: Insufficient documentation

## 2024-08-25 DIAGNOSIS — R1013 Epigastric pain: Secondary | ICD-10-CM | POA: Diagnosis not present

## 2024-08-25 DIAGNOSIS — M25511 Pain in right shoulder: Secondary | ICD-10-CM | POA: Diagnosis not present

## 2024-08-25 DIAGNOSIS — J9811 Atelectasis: Secondary | ICD-10-CM | POA: Diagnosis not present

## 2024-08-25 DIAGNOSIS — Z5321 Procedure and treatment not carried out due to patient leaving prior to being seen by health care provider: Secondary | ICD-10-CM | POA: Insufficient documentation

## 2024-08-25 DIAGNOSIS — I517 Cardiomegaly: Secondary | ICD-10-CM | POA: Diagnosis not present

## 2024-08-25 DIAGNOSIS — R0789 Other chest pain: Secondary | ICD-10-CM | POA: Diagnosis not present

## 2024-08-25 LAB — CBC
HCT: 44.5 % (ref 36.0–46.0)
Hemoglobin: 14.4 g/dL (ref 12.0–15.0)
MCH: 30.4 pg (ref 26.0–34.0)
MCHC: 32.4 g/dL (ref 30.0–36.0)
MCV: 94.1 fL (ref 80.0–100.0)
Platelets: 360 K/uL (ref 150–400)
RBC: 4.73 MIL/uL (ref 3.87–5.11)
RDW: 13.2 % (ref 11.5–15.5)
WBC: 9.4 K/uL (ref 4.0–10.5)
nRBC: 0 % (ref 0.0–0.2)

## 2024-08-25 LAB — BASIC METABOLIC PANEL WITH GFR
Anion gap: 10 (ref 5–15)
BUN: 13 mg/dL (ref 6–20)
CO2: 24 mmol/L (ref 22–32)
Calcium: 9.4 mg/dL (ref 8.9–10.3)
Chloride: 105 mmol/L (ref 98–111)
Creatinine, Ser: 0.72 mg/dL (ref 0.44–1.00)
GFR, Estimated: >60 mL/min (ref >60)
Glucose, Bld: 126 mg/dL — ABNORMAL HIGH (ref 70–99)
Potassium: 3.8 mmol/L (ref 3.5–5.1)
Sodium: 139 mmol/L (ref 135–145)

## 2024-08-25 LAB — TROPONIN I (HIGH SENSITIVITY): Troponin I (High Sensitivity): 3 ng/L (ref <18)

## 2024-08-25 NOTE — ED Provider Triage Note (Signed)
 Emergency Medicine Provider Triage Evaluation Note  Afrah Burlison , a 53 y.o. female  was evaluated in triage.  Pt complains of chest pain.  Onset at 3 PM today.  Patient states in the past this has been related to GI but the last time she had it it was a heart attack so she came in for further evaluation.  She denies shortness of breath nausea vomiting or diaphoresis.  She is complaining of epigastric cramping pain that radiates into her chest.  Review of Systems  Positive: chest pain Negative: Fever or nausea  Physical Exam  BP (!) 153/85 (BP Location: Right Arm)   Pulse 77   Temp 98.4 F (36.9 C)   Resp 17   SpO2 98%  Gen:   Awake, no distress   Resp:  Normal effort  MSK:   Moves extremities without difficulty  Other:    Medical Decision Making  Medically screening exam initiated at 9:55 PM.  Appropriate orders placed.  Tynetta Bachmann was informed that the remainder of the evaluation will be completed by another provider, this initial triage assessment does not replace that evaluation, and the importance of remaining in the ED until their evaluation is complete.     Arloa Chroman, PA-C 08/25/24 2200

## 2024-08-25 NOTE — ED Triage Notes (Signed)
 Pt reports she has been having worsening chest pain x 1 week. Pt reports that it feels the same as her last heart attack.pt reports pain in her right shoulder and epigastric pain.

## 2024-08-25 NOTE — ED Notes (Signed)
Patient evaluated by PA at triage .  

## 2024-08-26 NOTE — ED Notes (Signed)
 Called Patient 6x for vitals and lab, have not received a response.

## 2024-08-28 NOTE — Assessment & Plan Note (Deleted)
Needs complete cessation

## 2024-08-28 NOTE — Assessment & Plan Note (Deleted)
 Tolerating statin, encouraged heart healthy diet, avoid trans fats, minimize simple carbs and saturated fats. Increase exercise as tolerated

## 2024-08-28 NOTE — Assessment & Plan Note (Deleted)
 Well controlled, no changes to meds. Encouraged heart healthy diet such as the DASH diet and exercise as tolerated.

## 2024-08-28 NOTE — Assessment & Plan Note (Deleted)
 Mounjaro  weekly. hgba1c acceptable, minimize simple carbs. Increase exercise as tolerated. Continue current meds

## 2024-08-31 ENCOUNTER — Telehealth: Payer: Self-pay

## 2024-08-31 DIAGNOSIS — K644 Residual hemorrhoidal skin tags: Secondary | ICD-10-CM | POA: Diagnosis not present

## 2024-08-31 NOTE — Telephone Encounter (Signed)
   Pre-operative Risk Assessment    Patient Name: Bonnie Dickson  DOB: 04/06/1971 MRN: [id number]   Date of last office visit: 12/01/23 ROSABEL MOSE, NP Date of next office visit: NONE   Request for Surgical Clearance    Procedure:  EXTERNAL HEMORRHOID SURGERY  Date of Surgery:  Clearance TBD                                Surgeon:  DANN HUMMER, MD Surgeon's Group or Practice Name:  [facility] Phone number:  [phone] Fax number:  [phone]   ATTN: NAT MOOSE, BSN, RN   Type of Clearance Requested:   - Medical  - Pharmacy:  Hold Aspirin      Type of Anesthesia:  General    Additional requests/questions:    Signed, Lucie DELENA Ku   08/31/2024, 11:14 AM

## 2024-08-31 NOTE — Telephone Encounter (Signed)
 Patient has been scheduled for televisit med rec and consent done     Patient Consent for Virtual Visit         Bonnie Dickson has provided verbal consent on 08/31/2024 for a virtual visit (video or telephone).   CONSENT FOR VIRTUAL VISIT FOR:  Bonnie Dickson  By participating in this virtual visit I agree to the following:  I hereby voluntarily request, consent and authorize [facility] and its employed or contracted physicians, physician assistants, nurse practitioners or other licensed health care professionals (the Practitioner), to provide me with telemedicine health care services (the "Services) as deemed necessary by the treating Practitioner. I acknowledge and consent to receive the Services by the Practitioner via telemedicine. I understand that the telemedicine visit will involve communicating with the Practitioner through live audiovisual communication technology and the disclosure of certain medical information by electronic transmission. I acknowledge that I have been given the opportunity to request an in-person assessment or other available alternative prior to the telemedicine visit and am voluntarily participating in the telemedicine visit.  I understand that I have the right to withhold or withdraw my consent to the use of telemedicine in the course of my care at any time, without affecting my right to future care or treatment, and that the Practitioner or I may terminate the telemedicine visit at any time. I understand that I have the right to inspect all information obtained and/or recorded in the course of the telemedicine visit and may receive copies of available information for a reasonable fee.  I understand that some of the potential risks of receiving the Services via telemedicine include:  Delay or interruption in medical evaluation due to technological equipment failure or disruption; Information transmitted may not be sufficient (e.g. poor resolution of  images) to allow for appropriate medical decision making by the Practitioner; and/or  In rare instances, security protocols could fail, causing a breach of personal health information.  Furthermore, I acknowledge that it is my responsibility to provide information about my medical history, conditions and care that is complete and accurate to the best of my ability. I acknowledge that Practitioner's advice, recommendations, and/or decision may be based on factors not within their control, such as incomplete or inaccurate data provided by me or distortions of diagnostic images or specimens that may result from electronic transmissions. I understand that the practice of medicine is not an exact science and that Practitioner makes no warranties or guarantees regarding treatment outcomes. I acknowledge that a copy of this consent can be made available to me via my patient portal [facility] MyChart), or I can request a printed copy by calling the office of [facility].    I understand that my insurance will be billed for this visit.   I have read or had this consent read to me. I understand the contents of this consent, which adequately explains the benefits and risks of the Services being provided via telemedicine.  I have been provided ample opportunity to ask questions regarding this consent and the Services and have had my questions answered to my satisfaction. I give my informed consent for the services to be provided through the use of telemedicine in my medical care

## 2024-08-31 NOTE — Telephone Encounter (Signed)
   Name: Bonnie Dickson  DOB: June 09, 1971  MRN: [id number]  Primary Cardiologist: Vinie JAYSON Maxcy, MD   Preoperative team, please contact this patient and set up a phone call appointment for further preoperative risk assessment. Please obtain consent and complete medication review. Thank you for your help.  I confirm that guidance regarding antiplatelet and oral anticoagulation therapy has been completed and, if necessary, noted below.  Would continue ASA, not on plavix , throughout given PCI in ostial LAD.   I also confirmed the patient resides in the state of [place] . As per [facility] Medical Board telemedicine laws, the patient must reside in the state in which the provider is licensed.   Jon Nat Hails, PA 08/31/2024, 11:44 AM [facility]

## 2024-08-31 NOTE — Telephone Encounter (Signed)
Patient has been scheduled for televisit.

## 2024-09-01 ENCOUNTER — Ambulatory Visit: Admitting: Family Medicine

## 2024-09-01 DIAGNOSIS — E785 Hyperlipidemia, unspecified: Secondary | ICD-10-CM

## 2024-09-01 DIAGNOSIS — E109 Type 1 diabetes mellitus without complications: Secondary | ICD-10-CM

## 2024-09-01 DIAGNOSIS — I10 Essential (primary) hypertension: Secondary | ICD-10-CM

## 2024-09-01 DIAGNOSIS — I252 Old myocardial infarction: Secondary | ICD-10-CM

## 2024-09-01 DIAGNOSIS — F17200 Nicotine dependence, unspecified, uncomplicated: Secondary | ICD-10-CM

## 2024-09-01 NOTE — Progress Notes (Unsigned)
 Virtual Visit via Telephone Note   Because of Bonnie Dickson co-morbid illnesses, she is at least at moderate risk for complications without adequate follow up.  This format is felt to be most appropriate for this patient at this time.  Due to technical limitations with video connection (technology), today's appointment will be conducted as an audio only telehealth visit, and Bonnie Dickson verbally agreed to proceed in this manner.   All issues noted in this document were discussed and addressed.  No physical exam could be performed with this format.  Evaluation Performed:  Preoperative cardiovascular risk assessment _____________   Date:  09/01/2024   Patient ID:  Bonnie Dickson, DOB [birth year], MRN [id number] Patient Location:  Home Provider location:   Office  Primary Care Provider:  Domenica Harlene LABOR, MD Primary Cardiologist:  Vinie JAYSON Maxcy, MD  Chief Complaint / Patient Profile   53 y.o. y/o female with a h/o HTN, HLD, DM type II, IBS, CAD s/p NSTEMI with PCI/DES to proximal LAD who is pending external hemorrhoidectomy and presents today for telephonic preoperative cardiovascular risk assessment.  History of Present Illness    Bonnie Dickson is a 53 y.o. female who presents via audio/video conferencing for a telehealth visit today.  Pt was last seen in cardiology clinic on 12/01/2023 by Katlyn West, NP.  At that time Bonnie Dickson was doing well with no new cardiac complaints. She was seen in the ED on 08/25/2024 with complaint of chest pain similar to previous MI but appears patient left prior to completing workup. The patient is now pending procedure as outlined above. Since her last visit, she reports that she has been doing well and has not had any recurrence of her chest discomfort noted 2 weeks prior.  She reports that her discomfort resolved spontaneously and sometimes is reminiscent of her GI symptoms.  She was advised that if discomfort returns that she should use  nitroglycerin  and follow-up in the ED or with one of our heart care providers.  She denies chest pain, shortness of breath, lower extremity edema, fatigue, palpitations, melena, hematuria, hemoptysis, diaphoresis, weakness, presyncope, syncope, orthopnea, and PND.    Past Medical History    Past Medical History:  Diagnosis Date   Arthritis    lower back   Blood transfusion without reported diagnosis    CAD (coronary artery disease)    a. NSTEMI 03/2021 s/p DES to LAD.   Depression    situational   Diabetes mellitus without complication (HCC)    Endometriosis    GERD (gastroesophageal reflux disease)    History of chicken pox    HSV-2 (herpes simplex virus 2) infection    Hyperglycemia 07/24/2019   Hyperlipidemia    Hypertension    Insomnia    Myocardial infarction (HCC) 03/31/2021   Snoring    Tonsillitis and adenoiditis, chronic    childhood, caused adult snoring   Past Surgical History:  Procedure Laterality Date   ABDOMINAL HYSTERECTOMY     ovaries left in place   BREAST CYST EXCISION Right    35 years ago    COMBINED ABDOMINOPLASTY AND LIPOSUCTION     CORONARY STENT INTERVENTION N/A 04/01/2021   Procedure: CORONARY STENT INTERVENTION;  Surgeon: Anner Alm ORN, MD;  Location: [facility] INVASIVE CV LAB;  Service: Cardiovascular;  Laterality: N/A;   ENDOMETRIAL ABLATION     ENDOMETRIAL BIOPSY     LAPAROSCOPIC ENDOMETRIOSIS FULGURATION     LEFT HEART CATH AND CORONARY ANGIOGRAPHY N/A 04/01/2021   Procedure: LEFT  HEART CATH AND CORONARY ANGIOGRAPHY;  Surgeon: Anner Alm ORN, MD;  Location: [facility] INVASIVE CV LAB;  Service: Cardiovascular;  Laterality: N/A;   PARTIAL HYSTERECTOMY     vaginal   TUBAL LIGATION      Allergies  Allergies  Allergen Reactions   Ambien [Zolpidem]     Sleep Walking   Atorvastatin      Other reaction(s): myalgias (muscle pain)   Ciprofloxacin Hcl Anaphylaxis   Gentamicin Anaphylaxis   Penicillins Anaphylaxis   Ambien [Zolpidem Tartrate] Other (See  Comments)    Sleep walking    Bonnie Dickson ] Other (See Comments)    Rectal bleeding    Banana Itching, Swelling and Other (See Comments)    Tongue itches and swells- breathing not impaired   Ivp Dye [Iodinated Contrast Media] Nausea And Vomiting and Other (See Comments)    Severe vomiting    Lisinopril Cough   Metformin And Related Nausea Only and Other (See Comments)    Severe GI upset   Nsaids Other (See Comments)    Rectal bleeding    Pineapple Other (See Comments)    Causes a burning sensation in the mouth   Tape Other (See Comments)    Paper tape burns the skin   Codeine Rash   Flagyl [Metronidazole] Rash   Morphine And Codeine Rash    Home Medications    Prior to Admission medications   Medication Sig Start Date End Date Taking? Authorizing Provider  amLODipine  (NORVASC ) 10 MG tablet Take 1 tablet (10 mg total) by mouth daily. 12/01/23   West, Katlyn D, NP  aspirin  EC (ASPIRIN  LOW DOSE) 81 MG tablet Take 1 tablet (81 mg total) by mouth daily. Swallow whole. 06/22/24   Webb, Padonda B, FNP  carvedilol  (COREG ) 6.25 MG tablet Take 1 tablet (6.25 mg total) by mouth 2 (two) times daily with a meal. 12/01/23   Devora, Katlyn D, NP  dibucaine (NUPERCAINAL) 1 % OINT Place 1 Application rectally 2 (two) times daily as needed for up to 10 days 07/01/24   Sebastian Moles, MD  hydrOXYzine  (ATARAX ) 10 MG tablet Take 1-2 tablets (10-20 mg total) by mouth every 8 (eight) hours as needed. 08/24/23   Domenica Harlene LABOR, MD  hyoscyamine  (LEVSIN  SL) 0.125 MG SL tablet Place 1 tablet (0.125 mg total) under the tongue every 4 (four) hours as needed. 05/24/24   Domenica Harlene LABOR, MD  insulin  lispro (HUMALOG  KWIKPEN) 100 UNIT/ML KwikPen Max 30 Units daily per correction scale 09/18/22   Shamleffer, Ibtehal Jaralla, MD  Insulin  Pen Needle 32G X 4 MM MISC Use as directed with Humalog  up to 4 times a day; in the morning, at noon, in the evening, and at bedtime. 08/06/21   Shamleffer, Ibtehal Jaralla, MD   linaclotide  (LINZESS ) 290 MCG CAPS capsule Take 1 capsule (290 mcg total) by mouth daily before breakfast. 05/24/24   Domenica Harlene LABOR, MD  losartan  (COZAAR ) 25 MG tablet Take 1 tablet (25 mg total) by mouth daily. 09/03/23   Domenica Harlene LABOR, MD  nitroGLYCERIN  (NITROSTAT ) 0.4 MG SL tablet Place 1 tablet (0.4 mg total) under the tongue every 5 (five) minutes as needed for chest pain (up to 3 doses). 07/09/23   Hilty, Vinie BROCKS, MD  polyethylene glycol powder (MIRALAX ) 17 GM/SCOOP powder Take 6 capfuls of MiraLAX  in a 32 oz bottle of Gatorade by mouth over 2-4 hour period. If no bowel movement, the following day take 3 capfuls in a 16 oz drink. Once you have a  bowel movement, start taking 1 capful 1-3 times a day and slowly cut back as needed until you have normal bowel movements. You may stop at any point if you are having too much watery stools (more than 3 in a day). 05/10/24   Trine Raynell Moder, MD  rosuvastatin  (CRESTOR ) 40 MG tablet Take 1 tablet (40 mg total) by mouth daily. 12/01/23 05/27/24  West, Katlyn D, NP  tirzepatide  (MOUNJARO ) 5 MG/0.5ML Pen Inject 5 mg into the skin once a week. 07/25/24   Domenica Harlene LABOR, MD  tiZANidine  (ZANAFLEX ) 2 MG tablet Take 0.5-2 tablets (1-4 mg total) by mouth 2 (two) times daily as needed for muscle spasms. 09/14/23   Domenica Harlene LABOR, MD  valACYclovir  (VALTREX ) 500 MG tablet Take 1 tablet (500 mg total) by mouth 2 (two) times daily. 05/02/24   Domenica Harlene LABOR, MD  varenicline  (CHANTIX  CONTINUING MONTH PAK) 1 MG tablet Take 1 tablet (1 mg total) by mouth 2 (two) times daily. 08/24/23   Domenica Harlene LABOR, MD    Physical Exam    Vital Signs:  Bonnie Dickson does not have vital signs available for review today.  Given telephonic nature of communication, physical exam is limited. AAOx3. NAD. Normal affect.  Speech and respirations are unlabored.  Accessory Clinical Findings    None  Assessment & Plan    1.  Preoperative Cardiovascular Risk Assessment: -  Patient's RCRI score is 11%  The patient affirms she has been doing well without any new cardiac symptoms. They are able to achieve 7 METS without cardiac limitations. Therefore, based on ACC/AHA guidelines, the patient would be at acceptable risk for the planned procedure without further cardiovascular testing. The patient was advised that if she develops new symptoms prior to surgery to contact our office to arrange for a follow-up visit, and she verbalized understanding.   Regarding ASA therapy, we recommend continuation of ASA throughout the perioperative period.  However, if the surgeon feels that cessation of ASA is required in the perioperative period, it may be stopped 5-7 days prior to surgery with a plan to resume it as soon as felt to be feasible from a surgical standpoint in the post-operative period.   The patient was advised that if she develops new symptoms prior to surgery to contact our office to arrange for a follow-up visit, and she verbalized understanding.   A copy of this note will be routed to requesting surgeon.  Time:   Today, I have spent 6 minutes with the patient with telehealth technology discussing medical history, symptoms, and management plan.     Wyn Raddle, Jackee Shove, NP  09/01/2024, 5:14 PM

## 2024-09-02 ENCOUNTER — Ambulatory Visit: Attending: Cardiology

## 2024-09-02 DIAGNOSIS — Z01810 Encounter for preprocedural cardiovascular examination: Secondary | ICD-10-CM

## 2024-09-05 ENCOUNTER — Telehealth: Payer: Self-pay | Admitting: Pharmacy Technician

## 2024-09-05 NOTE — Telephone Encounter (Signed)
 Pharmacy Patient Advocate Encounter   Received notification from CoverMyMeds that prior authorization for Mounjaro  7.5MG /0.5ML auto-injectors is due for renewal.   Insurance verification completed.   The patient is insured through [employer].  Action: Medication has been discontinued. Archived Key: [id number]  **Patient on a different dose.**

## 2024-09-22 ENCOUNTER — Other Ambulatory Visit: Payer: Self-pay | Admitting: Family Medicine

## 2024-09-23 ENCOUNTER — Other Ambulatory Visit (HOSPITAL_BASED_OUTPATIENT_CLINIC_OR_DEPARTMENT_OTHER): Payer: Self-pay

## 2024-09-23 ENCOUNTER — Other Ambulatory Visit: Payer: Self-pay

## 2024-09-23 MED ORDER — LOSARTAN POTASSIUM 25 MG PO TABS
25.0000 mg | ORAL_TABLET | Freq: Every day | ORAL | 3 refills | Status: AC
  Filled 2024-09-23: qty 90, 90d supply, fill #0
  Filled 2025-01-05: qty 90, 90d supply, fill #1

## 2024-09-30 ENCOUNTER — Other Ambulatory Visit (HOSPITAL_BASED_OUTPATIENT_CLINIC_OR_DEPARTMENT_OTHER): Payer: Self-pay

## 2024-10-02 NOTE — Progress Notes (Deleted)
 Subjective:    Patient ID: Bonnie Dickson, female    DOB: 23-Aug-1971, 53 y.o.   MRN: [id number]  No chief complaint on file.   HPI Discussed the use of AI scribe software for clinical note transcription with the patient, who gave verbal consent to proceed.  History of Present Illness     Past Medical History:  Diagnosis Date  . Arthritis    lower back  . Blood transfusion without reported diagnosis   . CAD (coronary artery disease)    a. NSTEMI 03/2021 s/p DES to LAD.  SABRA Depression    situational  . Diabetes mellitus without complication (HCC)   . Endometriosis   . GERD (gastroesophageal reflux disease)   . History of chicken pox   . HSV-2 (herpes simplex virus 2) infection   . Hyperglycemia 07/24/2019  . Hyperlipidemia   . Hypertension   . Insomnia   . Myocardial infarction (HCC) 03/31/2021  . Snoring   . Tonsillitis and adenoiditis, chronic    childhood, caused adult snoring    Past Surgical History:  Procedure Laterality Date  . ABDOMINAL HYSTERECTOMY     ovaries left in place  . BREAST CYST EXCISION Right    35 years ago   . COMBINED ABDOMINOPLASTY AND LIPOSUCTION    . CORONARY STENT INTERVENTION N/A 04/01/2021   Procedure: CORONARY STENT INTERVENTION;  Surgeon: Anner Alm ORN, MD;  Location: [facility] INVASIVE CV LAB;  Service: Cardiovascular;  Laterality: N/A;  . ENDOMETRIAL ABLATION    . ENDOMETRIAL BIOPSY    . LAPAROSCOPIC ENDOMETRIOSIS FULGURATION    . LEFT HEART CATH AND CORONARY ANGIOGRAPHY N/A 04/01/2021   Procedure: LEFT HEART CATH AND CORONARY ANGIOGRAPHY;  Surgeon: Anner Alm ORN, MD;  Location: [facility] INVASIVE CV LAB;  Service: Cardiovascular;  Laterality: N/A;  . PARTIAL HYSTERECTOMY     vaginal  . TUBAL LIGATION      Family History  Problem Relation Age of Onset  . Hypertension Mother   . Diabetes Mother        prediabetes  . Hyperlipidemia Mother   . Diabetes Father   . Cataracts Father   . Eczema Brother   . Asthma Brother   . Diabetes  Maternal Grandmother   . Hypertension Maternal Grandmother   . Diabetes Maternal Grandfather   . Hypertension Maternal Grandfather   . Hyperlipidemia Maternal Grandfather   . Heart failure Maternal Grandfather   . COPD Maternal Grandfather   . ODD Son   . ADD / ADHD Son   . Diabetes Daughter   . Hypertension Daughter   . Colon cancer Neg Hx   . Stomach cancer Neg Hx   . Pancreatic cancer Neg Hx   . Esophageal cancer Neg Hx     Social History   Socioeconomic History  . Marital status: Widowed    Spouse name: Not on file  . Number of children: Not on file  . Years of education: Not on file  . Highest education level: Master's degree (e.g., MA, MS, MEng, MEd, MSW, MBA)  Occupational History  . Not on file  Tobacco Use  . Smoking status: Former    Current packs/day: 0.00    Average packs/day: 0.5 packs/day for 30.0 years (15.0 ttl pk-yrs)    Types: Cigarettes    Start date: 11/01/1991    Quit date: 10/31/2021    Years since quitting: 2.9  . Smokeless tobacco: Never  Vaping Use  . Vaping status: Never Used  Substance and Sexual Activity  .  Alcohol use: Never  . Drug use: Never  . Sexual activity: Not Currently    Birth control/protection: Surgical  Other Topics Concern  . Not on file  Social History Narrative  . Not on file   Social Drivers of Health   Financial Resource Strain: Low Risk  (09/01/2024)   Overall Financial Resource Strain (CARDIA)   . Difficulty of Paying Living Expenses: Not hard at all  Food Insecurity: No Food Insecurity (09/01/2024)   Hunger Vital Sign   . Worried About Programme Researcher, Broadcasting/film/video in the Last Year: Never true   . Ran Out of Food in the Last Year: Never true  Transportation Needs: No Transportation Needs (09/01/2024)   PRAPARE - Transportation   . Lack of Transportation (Medical): No   . Lack of Transportation (Non-Medical): No  Physical Activity: Sufficiently Active (09/01/2024)   Exercise Vital Sign   . Days of Exercise per Week: 3 days    . Minutes of Exercise per Session: 120 min  Stress: Stress Concern Present (09/01/2024)   [employer] of Occupational Health - Occupational Stress Questionnaire   . Feeling of Stress: To some extent  Social Connections: Moderately Isolated (09/01/2024)   Social Connection and Isolation Panel   . Frequency of Communication with Friends and Family: Three times a week   . Frequency of Social Gatherings with Friends and Family: Three times a week   . Attends Religious Services: Never   . Active Member of Clubs or Organizations: Yes   . Attends Banker Meetings: 1 to 4 times per year   . Marital Status: Widowed  Intimate Partner Violence: Not on file    Outpatient Medications Prior to Visit  Medication Sig Dispense Refill  . amLODipine  (NORVASC ) 10 MG tablet Take 1 tablet (10 mg total) by mouth daily. 90 tablet 3  . aspirin  EC (ASPIRIN  LOW DOSE) 81 MG tablet Take 1 tablet (81 mg total) by mouth daily. Swallow whole. 90 tablet 1  . carvedilol  (COREG ) 6.25 MG tablet Take 1 tablet (6.25 mg total) by mouth 2 (two) times daily with a meal. 180 tablet 3  . dibucaine (NUPERCAINAL) 1 % OINT Place 1 Application rectally 2 (two) times daily as needed for up to 10 days 56 g 0  . hydrOXYzine  (ATARAX ) 10 MG tablet Take 1-2 tablets (10-20 mg total) by mouth every 8 (eight) hours as needed. 40 tablet 2  . hyoscyamine  (LEVSIN  SL) 0.125 MG SL tablet Place 1 tablet (0.125 mg total) under the tongue every 4 (four) hours as needed. 30 tablet 0  . insulin  lispro (HUMALOG  KWIKPEN) 100 UNIT/ML KwikPen Max 30 Units daily per correction scale 6 mL 0  . Insulin  Pen Needle 32G X 4 MM MISC Use as directed with Humalog  up to 4 times a day; in the morning, at noon, in the evening, and at bedtime. 400 each 2  . linaclotide  (LINZESS ) 290 MCG CAPS capsule Take 1 capsule (290 mcg total) by mouth daily before breakfast. 30 capsule 3  . losartan  (COZAAR ) 25 MG tablet Take 1 tablet (25 mg total) by mouth daily.  90 tablet 3  . nitroGLYCERIN  (NITROSTAT ) 0.4 MG SL tablet Place 1 tablet (0.4 mg total) under the tongue every 5 (five) minutes as needed for chest pain (up to 3 doses). 25 tablet 3  . polyethylene glycol powder (MIRALAX ) 17 GM/SCOOP powder Take 6 capfuls of MiraLAX  in a 32 oz bottle of Gatorade by mouth over 2-4 hour period. If no bowel  movement, the following day take 3 capfuls in a 16 oz drink. Once you have a bowel movement, start taking 1 capful 1-3 times a day and slowly cut back as needed until you have normal bowel movements. You may stop at any point if you are having too much watery stools (more than 3 in a day). 238 g 0  . rosuvastatin  (CRESTOR ) 40 MG tablet Take 1 tablet (40 mg total) by mouth daily. 90 tablet 3  . tirzepatide  (MOUNJARO ) 5 MG/0.5ML Pen Inject 5 mg into the skin once a week. 6 mL 1  . tiZANidine  (ZANAFLEX ) 2 MG tablet Take 0.5-2 tablets (1-4 mg total) by mouth 2 (two) times daily as needed for muscle spasms. 40 tablet 1  . valACYclovir  (VALTREX ) 500 MG tablet Take 1 tablet (500 mg total) by mouth 2 (two) times daily. 180 tablet 0  . varenicline  (CHANTIX  CONTINUING MONTH PAK) 1 MG tablet Take 1 tablet (1 mg total) by mouth 2 (two) times daily. 60 tablet 5   No facility-administered medications prior to visit.    Allergies  Allergen Reactions  . Ambien [Zolpidem]     Sleep Walking  . Atorvastatin      Other reaction(s): myalgias (muscle pain)  . Ciprofloxacin Hcl Anaphylaxis  . Gentamicin Anaphylaxis  . Penicillins Anaphylaxis  . Ambien [Zolpidem Tartrate] Other (See Comments)    Sleep walking   . Asa [Aspirin ] Other (See Comments)    Rectal bleeding   . Banana Itching, Swelling and Other (See Comments)    Tongue itches and swells- breathing not impaired  . Ivp Dye [Iodinated Contrast Media] Nausea And Vomiting and Other (See Comments)    Severe vomiting   . Lisinopril Cough  . Metformin And Related Nausea Only and Other (See Comments)    Severe GI upset  .  Nsaids Other (See Comments)    Rectal bleeding   . Pineapple Other (See Comments)    Causes a burning sensation in the mouth  . Tape Other (See Comments)    Paper tape burns the skin  . Codeine Rash  . Flagyl [Metronidazole] Rash  . Morphine And Codeine Rash    Review of Systems  Constitutional:  Negative for fever and malaise/fatigue.  HENT:  Negative for congestion.   Eyes:  Negative for blurred vision.  Respiratory:  Negative for shortness of breath.   Cardiovascular:  Negative for chest pain, palpitations and leg swelling.  Gastrointestinal:  Negative for abdominal pain, blood in stool and nausea.  Genitourinary:  Negative for dysuria and frequency.  Musculoskeletal:  Negative for falls.  Skin:  Negative for rash.  Neurological:  Negative for dizziness, loss of consciousness and headaches.  Endo/Heme/Allergies:  Negative for environmental allergies.  Psychiatric/Behavioral:  Negative for depression. The patient is not nervous/anxious.        Objective:    Physical Exam Constitutional:      General: She is not in acute distress.    Appearance: Normal appearance. She is well-developed. She is not toxic-appearing.  HENT:     Head: Normocephalic and atraumatic.     Right Ear: External ear normal.     Left Ear: External ear normal.     Nose: Nose normal.  Eyes:     General:        Right eye: No discharge.        Left eye: No discharge.     Conjunctiva/sclera: Conjunctivae normal.  Neck:     Thyroid : No thyromegaly.  Cardiovascular:  Rate and Rhythm: Normal rate and regular rhythm.     Heart sounds: Normal heart sounds. No murmur heard. Pulmonary:     Effort: Pulmonary effort is normal. No respiratory distress.     Breath sounds: Normal breath sounds.  Abdominal:     General: Bowel sounds are normal.     Palpations: Abdomen is soft.     Tenderness: There is no abdominal tenderness. There is no guarding.  Musculoskeletal:        General: Normal range of  motion.     Cervical back: Neck supple.  Lymphadenopathy:     Cervical: No cervical adenopathy.  Skin:    General: Skin is warm and dry.  Neurological:     Mental Status: She is alert and oriented to person, place, and time.  Psychiatric:        Mood and Affect: Mood normal.        Behavior: Behavior normal.        Thought Content: Thought content normal.        Judgment: Judgment normal.    There were no vitals taken for this visit. Wt Readings from Last 3 Encounters:  05/27/24 199 lb (90.3 kg)  05/24/24 197 lb 3.2 oz (89.4 kg)  05/10/24 204 lb (92.5 kg)    Diabetic Foot Exam - Simple   No data filed    Lab Results  Component Value Date   WBC 9.4 08/25/2024   HGB 14.4 08/25/2024   HCT 44.5 08/25/2024   PLT 360 08/25/2024   GLUCOSE 126 (H) 08/25/2024   CHOL 232 (H) 02/22/2024   TRIG 114.0 02/22/2024   HDL 39.30 02/22/2024   LDLCALC 170 (H) 02/22/2024   ALT 11 05/10/2024   AST 16 05/10/2024   NA 139 08/25/2024   K 3.8 08/25/2024   CL 105 08/25/2024   CREATININE 0.72 08/25/2024   BUN 13 08/25/2024   CO2 24 08/25/2024   TSH 1.88 02/22/2024   INR 0.9 03/31/2021   HGBA1C 7.3 (H) 02/22/2024   MICROALBUR 3.5 (H) 02/22/2024    Lab Results  Component Value Date   TSH 1.88 02/22/2024   Lab Results  Component Value Date   WBC 9.4 08/25/2024   HGB 14.4 08/25/2024   HCT 44.5 08/25/2024   MCV 94.1 08/25/2024   PLT 360 08/25/2024   Lab Results  Component Value Date   NA 139 08/25/2024   K 3.8 08/25/2024   CO2 24 08/25/2024   GLUCOSE 126 (H) 08/25/2024   BUN 13 08/25/2024   CREATININE 0.72 08/25/2024   BILITOT 0.3 05/10/2024   ALKPHOS 96 05/10/2024   AST 16 05/10/2024   ALT 11 05/10/2024   PROT 7.9 05/10/2024   ALBUMIN 4.2 05/10/2024   CALCIUM  9.4 08/25/2024   ANIONGAP 10 08/25/2024   GFR 77.99 02/22/2024   Lab Results  Component Value Date   CHOL 232 (H) 02/22/2024   Lab Results  Component Value Date   HDL 39.30 02/22/2024   Lab Results   Component Value Date   LDLCALC 170 (H) 02/22/2024   Lab Results  Component Value Date   TRIG 114.0 02/22/2024   Lab Results  Component Value Date   CHOLHDL 6 02/22/2024   Lab Results  Component Value Date   HGBA1C 7.3 (H) 02/22/2024       Assessment & Plan:  Diabetes mellitus, labile (HCC) Assessment & Plan: Mounjaro , minimize simple carbs. Increase exercise as tolerated. Continue current meds    Hypertension, unspecified type Assessment &  Plan: Well controlled, no changes to meds. Encouraged heart healthy diet such as the DASH diet and exercise as tolerated.    Hyperlipidemia LDL goal <70 Assessment & Plan: Tolerating statin, encouraged heart healthy diet, avoid trans fats, minimize simple carbs and saturated fats. Increase exercise as tolerated    Tobacco dependence Assessment & Plan: Needs complete cessation but has had significant life stressors involving all of her adult children. Was stopped but has started now, she is willing to attempt cessation again in future     Assessment and Plan Assessment & Plan      Harlene Horton, MD

## 2024-10-02 NOTE — Assessment & Plan Note (Deleted)
 Tolerating statin, encouraged heart healthy diet, avoid trans fats, minimize simple carbs and saturated fats. Increase exercise as tolerated

## 2024-10-02 NOTE — Assessment & Plan Note (Deleted)
 Well controlled, no changes to meds. Encouraged heart healthy diet such as the DASH diet and exercise as tolerated.

## 2024-10-02 NOTE — Assessment & Plan Note (Deleted)
Needs complete cessation

## 2024-10-02 NOTE — Assessment & Plan Note (Deleted)
 Mounjaro , minimize simple carbs. Increase exercise as tolerated. Continue current meds

## 2024-10-03 ENCOUNTER — Ambulatory Visit: Admitting: Family Medicine

## 2024-10-03 DIAGNOSIS — F17200 Nicotine dependence, unspecified, uncomplicated: Secondary | ICD-10-CM

## 2024-10-03 DIAGNOSIS — I10 Essential (primary) hypertension: Secondary | ICD-10-CM

## 2024-10-03 DIAGNOSIS — E785 Hyperlipidemia, unspecified: Secondary | ICD-10-CM

## 2024-10-03 DIAGNOSIS — E109 Type 1 diabetes mellitus without complications: Secondary | ICD-10-CM

## 2024-10-25 ENCOUNTER — Other Ambulatory Visit (HOSPITAL_BASED_OUTPATIENT_CLINIC_OR_DEPARTMENT_OTHER): Payer: Self-pay

## 2024-10-26 ENCOUNTER — Other Ambulatory Visit (HOSPITAL_BASED_OUTPATIENT_CLINIC_OR_DEPARTMENT_OTHER): Payer: Self-pay

## 2024-10-26 ENCOUNTER — Other Ambulatory Visit: Payer: Self-pay

## 2024-10-26 ENCOUNTER — Other Ambulatory Visit: Payer: Self-pay | Admitting: Family Medicine

## 2024-10-26 MED ORDER — LINACLOTIDE 290 MCG PO CAPS
290.0000 ug | ORAL_CAPSULE | Freq: Every day | ORAL | 3 refills | Status: AC
  Filled 2024-10-26: qty 90, 90d supply, fill #0

## 2024-11-01 ENCOUNTER — Ambulatory Visit: Payer: Self-pay | Admitting: General Surgery

## 2024-11-01 NOTE — Progress Notes (Signed)
 Surgical Instructions   Your procedure is scheduled on Monday, December 8th, 2025. Report to [facility] Main Entrance A at 5:30 A.M., then check in with the Admitting office. Any questions or running late day of surgery: call [phone]  Questions prior to your surgery date: call [phone], Monday-Friday, 8am-4pm. If you experience any cold or flu symptoms such as cough, fever, chills, shortness of breath, etc. between now and your scheduled surgery, please notify us  at the above number.     Remember:  Do not eat after midnight the night before your surgery   You may drink clear liquids until 4:30 the morning of your surgery.   Clear liquids allowed are: Water, Non-Citrus Juices (without pulp), Carbonated Beverages, Clear Tea (no milk, honey, etc.), Black Coffee Only (NO MILK, CREAM OR POWDERED CREAMER of any kind), and Gatorade.    Take these medicines the morning of surgery with A SIP OF WATER: Amlodipine  (Norvasc ) Carvedilol  (Coreg )  Linaclotide  (Linzess ) Rosuvastatin  (Crestor ) Valacyclovir  (Valtrex )   May take these medicines IF NEEDED: Hydroxyzine  (Atarax ) Nitroglycerin  (Nitrostat ) - please call [phone] if you need to take this medication Tizanidine  (Zanaflex )   Follow your surgeon's instructions on when to stop your Aspirin .  If no instructions received, contact your surgeon's office.     One week prior to surgery, STOP taking any Aleve, Naproxen, Ibuprofen, Motrin, Advil, Goody's, BC's, all herbal medications, fish oil, and non-prescription vitamins.   WHAT DO I DO ABOUT MY DIABETES MEDICATION?   Tirzepatide  (Mounjaro ) should be held for at least 7 days prior to your surgery.  Your last dose of Tirzepatide  (Mounjaro ) should be on or before Sunday, November 30th.   THE NIGHT BEFORE SURGERY, do not take a bedtime dose of Insulin  Lispro (Humalog ).  On THE MORNING OF SURGERY, if your blood sugar is greater than 220, take 1/2 of your normal dose of Insulin   Lispro (Humalog ).    HOW TO MANAGE YOUR DIABETES BEFORE AND AFTER SURGERY  Why is it important to control my blood sugar before and after surgery? Improving blood sugar levels before and after surgery helps healing and can limit problems. A way of improving blood sugar control is eating a healthy diet by:  Eating less sugar and carbohydrates  Increasing activity/exercise  Talking with your doctor about reaching your blood sugar goals High blood sugars (greater than 180 mg/dL) can raise your risk of infections and slow your recovery, so you will need to focus on controlling your diabetes during the weeks before surgery. Make sure that the doctor who takes care of your diabetes knows about your planned surgery including the date and location.  How do I manage my blood sugar before surgery? Check your blood sugar at least 4 times a day, starting 2 days before surgery, to make sure that the level is not too high or low.  Check your blood sugar the morning of your surgery when you wake up and every 2 hours until you get to the Short Stay unit.  If your blood sugar is less than 70 mg/dL, you will need to treat for low blood sugar: Do not take insulin . Treat a low blood sugar (less than 70 mg/dL) with  cup of clear juice (cranberry or apple), 4 glucose tablets, OR glucose gel. Recheck blood sugar in 15 minutes after treatment (to make sure it is greater than 70 mg/dL). If your blood sugar is not greater than 70 mg/dL on recheck, call [phone] for further instructions. Report your blood sugar to  the short stay nurse when you get to Short Stay.  If you are admitted to the hospital after surgery: Your blood sugar will be checked by the staff and you will probably be given insulin  after surgery (instead of oral diabetes medicines) to make sure you have good blood sugar levels. The goal for blood sugar control after surgery is 80-180 mg/dL.                      Do NOT Smoke (Tobacco/Vaping)  for 24 hours prior to your procedure.  If you use a CPAP at night, you may bring your mask/headgear for your overnight stay.   You will be asked to remove any contacts, glasses, piercing's, hearing aid's, dentures/partials prior to surgery. Please bring cases for these items if needed.    Patients discharged the day of surgery will not be allowed to drive home, and someone needs to stay with them for 24 hours.  SURGICAL WAITING ROOM VISITATION Patients may have no more than 2 support people in the waiting area - these visitors may rotate.   Pre-op nurse will coordinate an appropriate time for 1 ADULT support person, who may not rotate, to accompany patient in pre-op.  Children under the age of 11 must have an adult with them who is not the patient and must remain in the main waiting area with an adult.  If the patient needs to stay at the hospital during part of their recovery, the visitor guidelines for inpatient rooms apply.  Please refer to the [facility] website for the visitor guidelines for any additional information.   If you received a COVID test during your pre-op visit  it is requested that you wear a mask when out in public, stay away from anyone that may not be feeling well and notify your surgeon if you develop symptoms. If you have been in contact with anyone that has tested positive in the last 10 days please notify you surgeon.      Pre-operative CHG Bathing Instructions   You can play a key role in reducing the risk of infection after surgery. Your skin needs to be as free of germs as possible. You can reduce the number of germs on your skin by washing with CHG (chlorhexidine gluconate) soap before surgery. CHG is an antiseptic soap that kills germs and continues to kill germs even after washing.   DO NOT use if you have an allergy to chlorhexidine/CHG or antibacterial soaps. If your skin becomes reddened or irritated, stop using the CHG and notify one of our RNs at  [phone].              TAKE A SHOWER THE NIGHT BEFORE SURGERY   Please keep in mind the following:  DO NOT shave, including legs and underarms, 48 hours prior to surgery.   You may shave your face before/day of surgery.  Place clean sheets on your bed the night before surgery Use a clean washcloth (not used since being washed) for shower. DO NOT sleep with pet's night before surgery.  CHG Shower Instructions:  Wash your face and private area with normal soap. If you choose to wash your hair, wash first with your normal shampoo.  After you use shampoo/soap, rinse your hair and body thoroughly to remove shampoo/soap residue.  Turn the water OFF and apply half the bottle of CHG soap to a CLEAN washcloth.  Apply CHG soap ONLY FROM YOUR NECK DOWN TO YOUR TOES (  washing for 3-5 minutes)  DO NOT use CHG soap on face, private areas, open wounds, or sores.  Pay special attention to the area where your surgery is being performed.  If you are having back surgery, having someone wash your back for you may be helpful. Wait 2 minutes after CHG soap is applied, then you may rinse off the CHG soap.  Pat dry with a clean towel  Put on clean pajamas    Additional instructions for the day of surgery: If you choose, you may shower the morning of surgery with an antibacterial soap.  DO NOT APPLY any lotions, deodorants, cologne, or perfumes.   Do not wear jewelry or makeup Do not wear nail polish, gel polish, artificial nails, or any other type of covering on natural nails (fingers and toes) Do not bring valuables to the hospital. [facility] is not responsible for valuables/personal belongings. Put on clean/comfortable clothes.  Please brush your teeth.  Ask your nurse before applying any prescription medications to the skin.

## 2024-11-02 ENCOUNTER — Other Ambulatory Visit: Payer: Self-pay

## 2024-11-02 ENCOUNTER — Encounter (HOSPITAL_COMMUNITY): Payer: Self-pay

## 2024-11-02 ENCOUNTER — Inpatient Hospital Stay (HOSPITAL_COMMUNITY): Admission: RE | Admit: 2024-11-02 | Discharge: 2024-11-02 | Attending: General Surgery

## 2024-11-02 VITALS — BP 138/84 | HR 85 | Temp 98.6°F | Resp 18 | Ht 66.5 in | Wt 196.4 lb

## 2024-11-02 DIAGNOSIS — K219 Gastro-esophageal reflux disease without esophagitis: Secondary | ICD-10-CM | POA: Diagnosis not present

## 2024-11-02 DIAGNOSIS — Z01818 Encounter for other preprocedural examination: Secondary | ICD-10-CM

## 2024-11-02 DIAGNOSIS — K644 Residual hemorrhoidal skin tags: Secondary | ICD-10-CM | POA: Diagnosis not present

## 2024-11-02 DIAGNOSIS — E119 Type 2 diabetes mellitus without complications: Secondary | ICD-10-CM | POA: Diagnosis not present

## 2024-11-02 DIAGNOSIS — Z90710 Acquired absence of both cervix and uterus: Secondary | ICD-10-CM | POA: Diagnosis not present

## 2024-11-02 DIAGNOSIS — Z87891 Personal history of nicotine dependence: Secondary | ICD-10-CM | POA: Diagnosis not present

## 2024-11-02 DIAGNOSIS — E785 Hyperlipidemia, unspecified: Secondary | ICD-10-CM | POA: Diagnosis not present

## 2024-11-02 DIAGNOSIS — Z01812 Encounter for preprocedural laboratory examination: Secondary | ICD-10-CM | POA: Diagnosis not present

## 2024-11-02 DIAGNOSIS — Z794 Long term (current) use of insulin: Secondary | ICD-10-CM | POA: Diagnosis not present

## 2024-11-02 DIAGNOSIS — I252 Old myocardial infarction: Secondary | ICD-10-CM | POA: Diagnosis not present

## 2024-11-02 DIAGNOSIS — I10 Essential (primary) hypertension: Secondary | ICD-10-CM | POA: Diagnosis not present

## 2024-11-02 DIAGNOSIS — I2510 Atherosclerotic heart disease of native coronary artery without angina pectoris: Secondary | ICD-10-CM | POA: Diagnosis not present

## 2024-11-02 DIAGNOSIS — Z955 Presence of coronary angioplasty implant and graft: Secondary | ICD-10-CM | POA: Diagnosis not present

## 2024-11-02 DIAGNOSIS — Z7985 Long-term (current) use of injectable non-insulin antidiabetic drugs: Secondary | ICD-10-CM | POA: Diagnosis not present

## 2024-11-02 DIAGNOSIS — Z7982 Long term (current) use of aspirin: Secondary | ICD-10-CM | POA: Diagnosis not present

## 2024-11-02 DIAGNOSIS — R0683 Snoring: Secondary | ICD-10-CM | POA: Diagnosis not present

## 2024-11-02 LAB — BASIC METABOLIC PANEL WITH GFR
Anion gap: 9 (ref 5–15)
BUN: 11 mg/dL (ref 6–20)
CO2: 27 mmol/L (ref 22–32)
Calcium: 9.6 mg/dL (ref 8.9–10.3)
Chloride: 104 mmol/L (ref 98–111)
Creatinine, Ser: 0.76 mg/dL (ref 0.44–1.00)
GFR, Estimated: >60 mL/min (ref >60)
Glucose, Bld: 171 mg/dL — ABNORMAL HIGH (ref 70–99)
Potassium: 4.2 mmol/L (ref 3.5–5.1)
Sodium: 140 mmol/L (ref 135–145)

## 2024-11-02 LAB — CBC
HCT: 43.6 % (ref 36.0–46.0)
Hemoglobin: 14.3 g/dL (ref 12.0–15.0)
MCH: 31.1 pg (ref 26.0–34.0)
MCHC: 32.8 g/dL (ref 30.0–36.0)
MCV: 94.8 fL (ref 80.0–100.0)
Platelets: 335 K/uL (ref 150–400)
RBC: 4.6 MIL/uL (ref 3.87–5.11)
RDW: 14.4 % (ref 11.5–15.5)
WBC: 9.5 K/uL (ref 4.0–10.5)
nRBC: 0 % (ref 0.0–0.2)

## 2024-11-02 LAB — HEMOGLOBIN A1C
Hgb A1c MFr Bld: 7.8 % — ABNORMAL HIGH (ref 4.8–5.6)
Mean Plasma Glucose: 177 mg/dL

## 2024-11-02 LAB — GLUCOSE, CAPILLARY: Glucose-Capillary: 206 mg/dL — ABNORMAL HIGH (ref 70–99)

## 2024-11-02 NOTE — Progress Notes (Addendum)
 PCP - Domenica Harlene LABOR, MD  Cardiologist - Vinie JAYSON Maxcy, MD   PPM/ICD - denies Device Orders - n/a Rep Notified - n/a  Chest x-ray - 08-25-24 EKG - 08-25-24 Stress Test -  ECHO - 06-11-22 Cardiac Cath - 04-01-21  Sleep Study - per patient 6 years ago CPAP -  per patient not needed  Fasting Blood Sugar - Per patient blood sugar ranges between 140-150. Blood sugar at PAT appointment 202. Patient reports she had just ate. Checks Blood Sugar every other day  Last dose of GLP1 agonist-  tirzepatide  (MOUNJARO )  GLP1 instructions:  Last dose 10-30-24. Patient aware to hold dose prior to procedure day of surgery  Blood Thinner Instructions:denies Aspirin  Instructions:last dose 11-02-24  ERAS Protcol - clear liquids PRE-SURGERY  G2-   COVID TEST- n/a   Anesthesia review: Yes Hx. Htn, MI, (stent x 1)  DM. Patient also instructed to follow up with surgeon regarding enema instructions.  Patient denies shortness of breath, fever, cough and chest pain at PAT appointment   All instructions explained to the patient, with a verbal understanding of the material. Patient agrees to go over the instructions while at home for a better understanding. Patient also instructed to self quarantine after being tested for COVID-19. The opportunity to ask questions was provided.

## 2024-11-02 NOTE — Progress Notes (Addendum)
 Surgical Instructions     Your procedure is scheduled on Monday, December 8th, 2025. Report to [facility] Main Entrance A at 5:30 A.M., then check in with the Admitting office. Any questions or running late day of surgery: call [phone]   Questions prior to your surgery date: call [phone], Monday-Friday, 8am-4pm. If you experience any cold or flu symptoms such as cough, fever, chills, shortness of breath, etc. between now and your scheduled surgery, please notify us  at the above number.            Remember:       Do not eat after midnight the night before your surgery     You may drink clear liquids until 4:30 the morning of your surgery.   Clear liquids allowed are: Water, Non-Citrus Juices (without pulp), Carbonated Beverages, Clear Tea (no milk, honey, etc.), Black Coffee Only (NO MILK, CREAM OR POWDERED CREAMER of any kind), and Gatorade. Patient Instructions  The night before surgery:  No food after midnight. ONLY clear liquids after midnight   Nothing else to drink after completing the  Pre-Surgery Clear Ensure.  The day of surgery (if you have diabetes): Drink ONE (1) 12 oz G2 given to you in your pre admission testing appointment by 4:30 the morning of surgery. Drink in one sitting. Do not sip.  This drink was given to you during your hospital  pre-op appointment visit.  Nothing else to drink after completing the  12 oz bottle of G2.         If you have questions, please contact your surgeon's office.          Take these medicines the morning of surgery with A SIP OF WATER: Amlodipine  (Norvasc ) Carvedilol  (Coreg )  Linaclotide  (Linzess ) Rosuvastatin  (Crestor ) Valacyclovir  (Valtrex )     May take these medicines IF NEEDED: Hydroxyzine  (Atarax ) Nitroglycerin  (Nitrostat ) - please call [phone] if you need to take this medication Tizanidine  (Zanaflex )     Follow your surgeon's instructions on when to stop your Aspirin .  If no instructions received,  contact your surgeon's office.        One week prior to surgery, STOP taking any Aleve, Naproxen, Ibuprofen, Motrin, Advil, Goody's, BC's, all herbal medications, fish oil, and non-prescription vitamins.     WHAT DO I DO ABOUT MY DIABETES MEDICATION?     Tirzepatide  (Mounjaro ) should be held for at least 7 days prior to your surgery.  Your last dose of Tirzepatide  (Mounjaro ) should be on or before Sunday, November 30th.    THE NIGHT BEFORE SURGERY, do not take a bedtime dose of Insulin  Lispro (Humalog ).   On THE MORNING OF SURGERY, if your blood sugar is greater than 220, take 1/2 of your normal dose of Insulin  Lispro (Humalog ).       HOW TO MANAGE YOUR DIABETES BEFORE AND AFTER SURGERY   Why is it important to control my blood sugar before and after surgery? Improving blood sugar levels before and after surgery helps healing and can limit problems. A way of improving blood sugar control is eating a healthy diet by:  Eating less sugar and carbohydrates  Increasing activity/exercise  Talking with your doctor about reaching your blood sugar goals High blood sugars (greater than 180 mg/dL) can raise your risk of infections and slow your recovery, so you will need to focus on controlling your diabetes during the weeks before surgery. Make sure that the doctor who takes care of your diabetes knows about your planned surgery including the  date and location.   How do I manage my blood sugar before surgery? Check your blood sugar at least 4 times a day, starting 2 days before surgery, to make sure that the level is not too high or low.   Check your blood sugar the morning of your surgery when you wake up and every 2 hours until you get to the Short Stay unit.   If your blood sugar is less than 70 mg/dL, you will need to treat for low blood sugar: Do not take insulin . Treat a low blood sugar (less than 70 mg/dL) with  cup of clear juice (cranberry or apple), 4 glucose tablets, OR glucose  gel. Recheck blood sugar in 15 minutes after treatment (to make sure it is greater than 70 mg/dL). If your blood sugar is not greater than 70 mg/dL on recheck, call [phone] for further instructions. Report your blood sugar to the short stay nurse when you get to Short Stay.   If you are admitted to the hospital after surgery: Your blood sugar will be checked by the staff and you will probably be given insulin  after surgery (instead of oral diabetes medicines) to make sure you have good blood sugar levels. The goal for blood sugar control after surgery is 80-180 mg/dL.                       Do NOT Smoke (Tobacco/Vaping) for 24 hours prior to your procedure.   If you use a CPAP at night, you may bring your mask/headgear for your overnight stay.   You will be asked to remove any contacts, glasses, piercing's, hearing aid's, dentures/partials prior to surgery. Please bring cases for these items if needed.    Patients discharged the day of surgery will not be allowed to drive home, and someone needs to stay with them for 24 hours.   SURGICAL WAITING ROOM VISITATION Patients may have no more than 2 support people in the waiting area - these visitors may rotate.   Pre-op nurse will coordinate an appropriate time for 1 ADULT support person, who may not rotate, to accompany patient in pre-op.  Children under the age of 22 must have an adult with them who is not the patient and must remain in the main waiting area with an adult.   If the patient needs to stay at the hospital during part of their recovery, the visitor guidelines for inpatient rooms apply.   Please refer to the [facility] website for the visitor guidelines for any additional information.     If you received a COVID test during your pre-op visit  it is requested that you wear a mask when out in public, stay away from anyone that may not be feeling well and notify your surgeon if you develop symptoms. If you have been in contact  with anyone that has tested positive in the last 10 days please notify you surgeon.         Pre-operative CHG Bathing Instructions    You can play a key role in reducing the risk of infection after surgery. Your skin needs to be as free of germs as possible. You can reduce the number of germs on your skin by washing with CHG (chlorhexidine gluconate) soap before surgery. CHG is an antiseptic soap that kills germs and continues to kill germs even after washing.    DO NOT use if you have an allergy to chlorhexidine/CHG or antibacterial soaps. If your skin  becomes reddened or irritated, stop using the CHG and notify one of our RNs at [phone].               TAKE A SHOWER THE NIGHT BEFORE SURGERY    Please keep in mind the following:  DO NOT shave, including legs and underarms, 48 hours prior to surgery.   You may shave your face before/day of surgery.  Place clean sheets on your bed the night before surgery Use a clean washcloth (not used since being washed) for shower. DO NOT sleep with pet's night before surgery.   CHG Shower Instructions:  Wash your face and private area with normal soap. If you choose to wash your hair, wash first with your normal shampoo.  After you use shampoo/soap, rinse your hair and body thoroughly to remove shampoo/soap residue.  Turn the water OFF and apply half the bottle of CHG soap to a CLEAN washcloth.  Apply CHG soap ONLY FROM YOUR NECK DOWN TO YOUR TOES (washing for 3-5 minutes)  DO NOT use CHG soap on face, private areas, open wounds, or sores.  Pay special attention to the area where your surgery is being performed.  If you are having back surgery, having someone wash your back for you may be helpful. Wait 2 minutes after CHG soap is applied, then you may rinse off the CHG soap.  Pat dry with a clean towel  Put on clean pajamas     Additional instructions for the day of surgery: If you choose, you may shower the morning of surgery with an  antibacterial soap.  DO NOT APPLY any lotions, deodorants, cologne, or perfumes.   Do not wear jewelry or makeup Do not wear nail polish, gel polish, artificial nails, or any other type of covering on natural nails (fingers and toes) Do not bring valuables to the hospital. [facility] is not responsible for valuables/personal belongings. Put on clean/comfortable clothes.  Please brush your teeth.  Ask your nurse before applying any prescription medications to the skin.

## 2024-11-03 NOTE — Progress Notes (Signed)
 Anesthesia Chart Review:  Case: [id number] Date/Time: 11/07/24 0715   Procedure: HEMORRHOIDECTOMY - EXTERNAL HEMORRHOIDECTOMY   Anesthesia type: General   Pre-op diagnosis: EXTERNAL HEMORRHOIDS   [facility]: [facility]OM [facility] / [facility]   Surgeons: Sebastian Moles, MD       DISCUSSION: Dickson is a 53 year old female scheduled for Bonnie above procedure.  History includes smoking, HTN, HLD, CAD (NSTEMI s/p DES ostial-proximal LAD 04/01/2021), DM2, GERD, snoring (reported a prior sleep study > 5 years ago with CPAP not recommended), endometriosis (s/p ablation, hysterectomy 03/05/2009).   Bonnie Dickson had preoperative cardiology telephonic evaluation on 09/02/2024 by Wyn Jackee Raddle, NP. He noted ED visit for chest pain on 08/25/2024. Troponin was negative, but Bonnie Dickson left without completely evaluation. Symptoms resolved and had not recurred. He wrote, Dickson's RCRI score is 11%   Bonnie Dickson affirms Bonnie Dickson has been doing well without any new cardiac symptoms. They are able to achieve 7 METS without cardiac limitations. Therefore, based on ACC/AHA guidelines, Bonnie Dickson would be at acceptable risk for Bonnie planned procedure without further cardiovascular testing. Bonnie Dickson was advised that if Bonnie Dickson develops new symptoms prior to surgery to contact our office to arrange for a follow-up visit, and Bonnie Dickson verbalized understanding.    Regarding ASA therapy, we recommend continuation of ASA throughout Bonnie perioperative period.  However, if Bonnie surgeon feels that cessation of ASA is required in Bonnie perioperative period, it may be stopped 5-7 days prior to surgery with a plan to resume it as soon as felt to be feasible from a surgical standpoint in Bonnie post-operative period.    A1c 7.8%. Bonnie Dickson is on Mounjaro  5 mg weekly, Humalog  1(00 unit/mL) up to QID and as needed for hyperglycemia. Bonnie Dickson reported home CBGs ~ 140-150's. Last Mounjaro  10/30/2024.   Anesthesia team to evaluate on Bonnie day of surgery. Last ASA reported as 11/02/2024.     VS: BP 138/84   Pulse 85   Temp 37 C   Resp 18   Ht 5' 6.5 (1.689 m)   Wt 89.1 kg   SpO2 97%   BMI 31.22 kg/m   PROVIDERS: Domenica Harlene LABOR, MD is PCP  Mona Vinie BROCKS, MD is cardiologist   LABS: Labs reviewed: Acceptable for surgery. A1c 7.8%, up from 7.3% on 02/22/2024.  (all labs ordered are listed, but only abnormal results are displayed)  Labs Reviewed  GLUCOSE, CAPILLARY - Abnormal; Notable for Bonnie following components:      Result Value   Glucose-Capillary 206 (*)    All other components within normal limits  HEMOGLOBIN A1C - Abnormal; Notable for Bonnie following components:   Hgb A1c MFr Bld 7.8 (*)    All other components within normal limits  BASIC METABOLIC PANEL WITH GFR - Abnormal; Notable for Bonnie following components:   Glucose, Bld 171 (*)    All other components within normal limits  CBC    IMAGES: CXR 08/25/2024: FINDINGS: Slightly shallow inspiration. Linear atelectasis in Bonnie lung bases. Mild cardiac enlargement. No vascular congestion or edema. No pleural effusion or pneumothorax. Mediastinal contours appear intact. Degenerative changes in Bonnie spine. IMPRESSION: Linear atelectasis in Bonnie lung bases with mild cardiac enlargement. No focal consolidation.   EKG:08/25/2024: Normal sinus rhythm  Low voltage QRS  Cannot rule out Anterior infarct , age undetermined  ST & T wave abnormality, consider inferior ischemia  Abnormal ECG  When compared with ECG of 01-Dec-2023 08:05, PREVIOUS ECG IS PRESENT Confirmed by Ruthe Cornet [phone]) on 08/26/2024 10:52:44 AM  Confirmed By: A - I think baseline wander in leads I, II, III limits interpretation of inferior leads. Suspect changes are more non-specific. No significant ST/T wave abnormality in aVF.     CV: Echo 06/11/2022: IMPRESSIONS   1. Left ventricular ejection fraction, by estimation, is 60 to 65%. Bonnie  left ventricle has normal function. Bonnie left ventricle has no regional  wall motion  abnormalities. There is mild left ventricular hypertrophy.  Left ventricular diastolic parameters  are consistent with Grade II diastolic dysfunction (pseudonormalization).   2. Right ventricular systolic function is normal. Bonnie right ventricular  size is normal.   3. No evidence of mitral valve regurgitation.   4. Bonnie aortic valve is tricuspid. Aortic valve regurgitation is not  visualized.   5. No significant aortic root or ascending aneurysm.   6. Bonnie inferior vena cava is normal in size with greater than 50%  respiratory variability, suggesting right atrial pressure of 3 mmHg.  - Comparison(s): No significant change from prior study.  - Comparison LVEF 45-50% by visual estimate with mid to apical anterior hypokinesis 04/01/2021 cath;  LVEF 60-65%, no RWMA, grade II DD, normal PASP, estimated RVSP 10.1 mmHg, trivial MR 03/26/2021 TTE.   Cardiac cath 04/01/2021: Culprit Lesion: Ost LAD to Prox LAD lesion is 95% stenosed. A drug-eluting stent was successfully placed using a SYNERGY XD [id number]. -> Postdilated in tapered fashion from 4.1 down to 3.0 mm Post intervention, there is a 0% residual stenosis. Dist LAD lesion is 30% stenosed. ------------------------------ Prox Cx to Mid Cx lesion is 30% stenosed. Prox RCA lesion is 50% stenosed. RPDA lesion is 60% stenosed. --------------------------- There is mild left ventricular systolic dysfunction. Bonnie left ventricular ejection fraction is 45-50% by visual estimate -> mid to apical anterior hypokinesis LV end diastolic pressure is mildly elevated.   SUMMARY Two-vessel CAD: Culprit lesion is severe 95% ostial-proximal LAD-eccentric stenosis; also noted moderate 50% proximal RCA and 60% ostial PDA. Successful DES PCI of ostial proximal LAD using a synergy DES 2.75 mm x 16 mm postdilated and taper fashion from 4.1 to 3.0 mm. Mildly reduced EF with mid apical anterior hypokinesis. Systemic Hypertension with mild to moderately elevated LVEDP.    RECOMMENDATION Return to nursing unit for ongoing care and TR band removal. Aggressive blood pressure management-wean off nitroglycerin . High-dose statin [facility] 1 consult  Past Medical History:  Diagnosis Date   Arthritis    lower back   Blood transfusion without reported diagnosis    CAD (coronary artery disease)    a. NSTEMI 03/2021 s/p DES to LAD.   Depression    situational   Diabetes mellitus without complication (HCC)    Endometriosis    GERD (gastroesophageal reflux disease)    History of chicken pox    HSV-2 (herpes simplex virus 2) infection    Hyperglycemia 07/24/2019   Hyperlipidemia    Hypertension    Insomnia    Myocardial infarction (HCC) 03/31/2021   Snoring    Tonsillitis and adenoiditis, chronic    childhood, caused adult snoring    Past Surgical History:  Procedure Laterality Date   ABDOMINAL HYSTERECTOMY     ovaries left in place   BREAST CYST EXCISION Right    35 years ago    COMBINED ABDOMINOPLASTY AND LIPOSUCTION     CORONARY STENT INTERVENTION N/A 04/01/2021   Procedure: CORONARY STENT INTERVENTION;  Surgeon: Anner Alm ORN, MD;  Location: [facility] INVASIVE CV LAB;  Service: Cardiovascular;  Laterality: N/A;   ENDOMETRIAL ABLATION  ENDOMETRIAL BIOPSY     LAPAROSCOPIC ENDOMETRIOSIS FULGURATION     LEFT HEART CATH AND CORONARY ANGIOGRAPHY N/A 04/01/2021   Procedure: LEFT HEART CATH AND CORONARY ANGIOGRAPHY;  Surgeon: Anner Alm ORN, MD;  Location: [facility] INVASIVE CV LAB;  Service: Cardiovascular;  Laterality: N/A;   PARTIAL HYSTERECTOMY     vaginal   TUBAL LIGATION      MEDICATIONS:  amLODipine  (NORVASC ) 10 MG tablet   aspirin  EC (ASPIRIN  LOW DOSE) 81 MG tablet   carvedilol  (COREG ) 6.25 MG tablet   dibucaine (NUPERCAINAL) 1 % OINT   hydrOXYzine  (ATARAX ) 10 MG tablet   insulin  lispro (HUMALOG  KWIKPEN) 100 UNIT/ML KwikPen   Insulin  Pen Needle 32G X 4 MM MISC   linaclotide  (LINZESS ) 290 MCG CAPS capsule   losartan  (COZAAR ) 25 MG tablet    nitroGLYCERIN  (NITROSTAT ) 0.4 MG SL tablet   rosuvastatin  (CRESTOR ) 40 MG tablet   tirzepatide  (MOUNJARO ) 5 MG/0.5ML Pen   tiZANidine  (ZANAFLEX ) 2 MG tablet   valACYclovir  (VALTREX ) 500 MG tablet   No current facility-administered medications for this encounter.    Isaiah Ruder, PA-C Surgical Short Stay/Anesthesiology [facility] Phone [phone] [facility] Phone [phone] 11/03/2024 11:22 AM

## 2024-11-03 NOTE — Anesthesia Preprocedure Evaluation (Addendum)
 Anesthesia Evaluation  Patient identified by MRN, date of birth, ID band Patient awake    Reviewed: Allergy & Precautions, NPO status , Patient's Chart, lab work & pertinent test results  History of Anesthesia Complications Negative for: history of anesthetic complications  Airway Mallampati: II  TM Distance: >3 FB Neck ROM: Full    Dental no notable dental hx. (+) Teeth Intact   Pulmonary neg sleep apnea, neg COPD, Current Smoker and Patient abstained from smoking.   Pulmonary exam normal breath sounds clear to auscultation       Cardiovascular Exercise Tolerance: Good METShypertension, + CAD, + Past MI and + Cardiac Stents  (-) dysrhythmias  Rhythm:Regular Rate:Normal - Systolic murmurs    Neuro/Psych  PSYCHIATRIC DISORDERS Anxiety Depression    negative neurological ROS     GI/Hepatic ,neg GERD  ,,(+)     (-) substance abuse    Endo/Other  diabetes, Insulin  Dependent  Last GLP1ra taken 7 days ago. Denies GI symptoms today  Renal/GU negative Renal ROS     Musculoskeletal   Abdominal   Peds  Hematology   Anesthesia Other Findings Per PAT note: History includes smoking, HTN, HLD, CAD (NSTEMI s/p DES ostial-proximal LAD 04/01/2021), DM2, GERD, snoring (reported a prior sleep study > 5 years ago with CPAP not recommended), endometriosis (s/p ablation, hysterectomy 03/05/2009).    She had preoperative cardiology telephonic evaluation on 09/02/2024 by Wyn Jackee Raddle, NP. He noted ED visit for chest pain on 08/25/2024. Troponin was negative, but she left without completely evaluation. Symptoms resolved and had not recurred. He wrote, Patient's RCRI score is 11%   The patient affirms she has been doing well without any new cardiac symptoms. They are able to achieve 7 METS without cardiac limitations. Therefore, based on ACC/AHA guidelines, the patient would be at acceptable risk for the planned procedure without further  cardiovascular testing. The patient was advised that if she develops new symptoms prior to surgery to contact our office to arrange for a follow-up visit, and she verbalized understanding.    Regarding ASA therapy, we recommend continuation of ASA throughout the perioperative period.  However, if the surgeon feels that cessation of ASA is required in the perioperative period, it may be stopped 5-7 days prior to surgery with a plan to resume it as soon as felt to be feasible from a surgical standpoint in the post-operative period.     A1c 7.8%. She is on Mounjaro  5 mg weekly, Humalog  1(00 unit/mL) up to QID and as needed for hyperglycemia. She reported home CBGs ~ 140-150's. Last Mounjaro  10/30/2024.    Anesthesia team to evaluate on the day of surgery. Last ASA reported as 11/02/2024.      VS: BP 138/84   Pulse 85   Temp 37 C   Resp 18   Ht 5' 6.5 (1.689 m)   Wt 89.1 kg   SpO2 97%   BMI 31.22 kg/m    PROVIDERS: Domenica Harlene LABOR, MD is PCP  Mona Vinie BROCKS, MD is cardiologist     LABS: Labs reviewed: Acceptable for surgery. A1c 7.8%, up from 7.3% on 02/22/2024.  (all labs ordered are listed, but only abnormal results are displayed)   Labs Reviewed GLUCOSE, CAPILLARY - Abnormal; Notable for the following components:     Result Value    Glucose-Capillary 206 (*)     All other components within normal limits HEMOGLOBIN A1C - Abnormal; Notable for the following components:   Hgb A1c MFr Bld 7.8 (*)  All other components within normal limits BASIC METABOLIC PANEL WITH GFR - Abnormal; Notable for the following components:   Glucose, Bld 171 (*)     All other components within normal limits CBC     IMAGES: CXR 08/25/2024: FINDINGS: Slightly shallow inspiration. Linear atelectasis in the lung bases. Mild cardiac enlargement. No vascular congestion or edema. No pleural effusion or pneumothorax. Mediastinal contours appear intact. Degenerative changes in the  spine. IMPRESSION: Linear atelectasis in the lung bases with mild cardiac enlargement. No focal consolidation.     EKG:08/25/2024: Normal sinus rhythm  Low voltage QRS  Cannot rule out Anterior infarct , age undetermined  ST & T wave abnormality, consider inferior ischemia  Abnormal ECG  When compared with ECG of 01-Dec-2023 08:05, PREVIOUS ECG IS PRESENT Confirmed by Ruthe Cornet [phone]) on 08/26/2024 10:52:44 AM Confirmed By: A - I think baseline wander in leads I, II, III limits interpretation of inferior leads. Suspect changes are more non-specific. No significant ST/T wave abnormality in aVF.      CV: Echo 06/11/2022: IMPRESSIONS   1. Left ventricular ejection fraction, by estimation, is 60 to 65%. The  left ventricle has normal function. The left ventricle has no regional  wall motion abnormalities. There is mild left ventricular hypertrophy.  Left ventricular diastolic parameters  are consistent with Grade II diastolic dysfunction (pseudonormalization).   2. Right ventricular systolic function is normal. The right ventricular  size is normal.   3. No evidence of mitral valve regurgitation.   4. The aortic valve is tricuspid. Aortic valve regurgitation is not  visualized.   5. No significant aortic root or ascending aneurysm.   6. The inferior vena cava is normal in size with greater than 50%  respiratory variability, suggesting right atrial pressure of 3 mmHg.  - Comparison(s): No significant change from prior study.  - Comparison LVEF 45-50% by visual estimate with mid to apical anterior hypokinesis 04/01/2021 cath;  LVEF 60-65%, no RWMA, grade II DD, normal PASP, estimated RVSP 10.1 mmHg, trivial MR 03/26/2021 TTE.   Reproductive/Obstetrics                              Anesthesia Physical Anesthesia Plan  ASA: 3  Anesthesia Plan: General   Post-op Pain Management: Tylenol  PO (pre-op)* and Gabapentin  PO (pre-op)*   Induction:  Intravenous  PONV Risk Score and Plan: 2 and Ondansetron , Dexamethasone  and Midazolam   Airway Management Planned: LMA  Additional Equipment: None  Intra-op Plan:   Post-operative Plan: Extubation in OR  Informed Consent: I have reviewed the patients History and Physical, chart, labs and discussed the procedure including the risks, benefits and alternatives for the proposed anesthesia with the patient or authorized representative who has indicated his/her understanding and acceptance.     Dental advisory given  Plan Discussed with: CRNA and Surgeon  Anesthesia Plan Comments: (Discussed risks of anesthesia with patient, including PONV, sore throat, lip/dental/eye damage. Rare risks discussed as well, such as cardiorespiratory and neurological sequelae, and allergic reactions. Discussed the role of CRNA in patient's perioperative care. Patient understands.)         Anesthesia Quick Evaluation

## 2024-11-07 ENCOUNTER — Ambulatory Visit (HOSPITAL_COMMUNITY): Payer: Self-pay | Admitting: Vascular Surgery

## 2024-11-07 ENCOUNTER — Encounter (HOSPITAL_COMMUNITY): Admission: RE | Disposition: A | Payer: Self-pay | Source: Home / Self Care | Attending: General Surgery

## 2024-11-07 ENCOUNTER — Other Ambulatory Visit (HOSPITAL_COMMUNITY): Payer: Self-pay

## 2024-11-07 ENCOUNTER — Ambulatory Visit (HOSPITAL_COMMUNITY): Admitting: Anesthesiology

## 2024-11-07 ENCOUNTER — Other Ambulatory Visit: Payer: Self-pay

## 2024-11-07 ENCOUNTER — Ambulatory Visit (HOSPITAL_COMMUNITY)
Admission: RE | Admit: 2024-11-07 | Discharge: 2024-11-07 | Disposition: A | Discharge status: Home / Self Care | Attending: General Surgery | Admitting: General Surgery

## 2024-11-07 ENCOUNTER — Encounter (HOSPITAL_COMMUNITY): Payer: Self-pay | Admitting: General Surgery

## 2024-11-07 DIAGNOSIS — J449 Chronic obstructive pulmonary disease, unspecified: Secondary | ICD-10-CM | POA: Diagnosis not present

## 2024-11-07 DIAGNOSIS — K644 Residual hemorrhoidal skin tags: Secondary | ICD-10-CM | POA: Diagnosis not present

## 2024-11-07 DIAGNOSIS — I252 Old myocardial infarction: Secondary | ICD-10-CM | POA: Diagnosis not present

## 2024-11-07 DIAGNOSIS — E119 Type 2 diabetes mellitus without complications: Secondary | ICD-10-CM | POA: Diagnosis not present

## 2024-11-07 DIAGNOSIS — Z955 Presence of coronary angioplasty implant and graft: Secondary | ICD-10-CM | POA: Diagnosis not present

## 2024-11-07 DIAGNOSIS — F17200 Nicotine dependence, unspecified, uncomplicated: Secondary | ICD-10-CM | POA: Diagnosis not present

## 2024-11-07 DIAGNOSIS — Z7985 Long-term (current) use of injectable non-insulin antidiabetic drugs: Secondary | ICD-10-CM | POA: Diagnosis not present

## 2024-11-07 DIAGNOSIS — Z794 Long term (current) use of insulin: Secondary | ICD-10-CM | POA: Diagnosis not present

## 2024-11-07 DIAGNOSIS — I10 Essential (primary) hypertension: Secondary | ICD-10-CM | POA: Diagnosis not present

## 2024-11-07 DIAGNOSIS — I2510 Atherosclerotic heart disease of native coronary artery without angina pectoris: Secondary | ICD-10-CM | POA: Diagnosis not present

## 2024-11-07 HISTORY — PX: HEMORRHOID SURGERY: SHX153

## 2024-11-07 LAB — GLUCOSE, CAPILLARY
Glucose-Capillary: 179 mg/dL — ABNORMAL HIGH (ref 70–99)
Glucose-Capillary: 171 mg/dL — ABNORMAL HIGH (ref 70–99)

## 2024-11-07 SURGERY — HEMORRHOIDECTOMY
Anesthesia: General | Site: Rectum

## 2024-11-07 MED ORDER — CHLORHEXIDINE GLUCONATE CLOTH 2 % EX PADS: 6.0000 | MEDICATED_PAD | Freq: Once | CUTANEOUS | Status: DC

## 2024-11-07 MED ORDER — CLINDAMYCIN PHOSPHATE 900 MG/50ML IV SOLN
900.0000 mg | INTRAVENOUS | Status: DC
  Filled 2024-11-07: qty 50

## 2024-11-07 MED ORDER — ACETAMINOPHEN 500 MG PO TABS
1000.0000 mg | ORAL_TABLET | ORAL | Status: AC
  Administered 2024-11-07: 1000 mg via ORAL
  Filled 2024-11-07: qty 2

## 2024-11-07 MED ORDER — ONDANSETRON HCL 4 MG/2ML IJ SOLN
INTRAMUSCULAR | Status: DC | PRN
  Administered 2024-11-07: 4 mg via INTRAVENOUS

## 2024-11-07 MED ORDER — FLEET ENEMA RE ENEM
1.0000 | ENEMA | Freq: Once | RECTAL | Status: DC
  Filled 2024-11-07: qty 1

## 2024-11-07 MED ORDER — OXYCODONE HCL 5 MG/5ML PO SOLN: 5.0000 mg | Freq: Once | ORAL | Status: AC | PRN

## 2024-11-07 MED ORDER — BUPIVACAINE-EPINEPHRINE (PF) 0.25% -1:200000 IJ SOLN
INTRAMUSCULAR | Status: DC | PRN
  Administered 2024-11-07: 40 mL

## 2024-11-07 MED ORDER — LIDOCAINE 2% (20 MG/ML) 5 ML SYRINGE
INTRAMUSCULAR | Status: AC
  Filled 2024-11-07: qty 5

## 2024-11-07 MED ORDER — OXYCODONE HCL 5 MG PO TABS
5.0000 mg | ORAL_TABLET | Freq: Once | ORAL | Status: AC | PRN
  Administered 2024-11-07: 5 mg via ORAL

## 2024-11-07 MED ORDER — ONDANSETRON HCL 4 MG/2ML IJ SOLN
INTRAMUSCULAR | Status: AC
  Filled 2024-11-07: qty 2

## 2024-11-07 MED ORDER — PROPOFOL 10 MG/ML IV BOLUS
INTRAVENOUS | Status: DC | PRN
  Administered 2024-11-07: 120 mg via INTRAVENOUS

## 2024-11-07 MED ORDER — CHLORHEXIDINE GLUCONATE 0.12 % MT SOLN
15.0000 mL | Freq: Once | OROMUCOSAL | Status: AC
  Administered 2024-11-07: 15 mL via OROMUCOSAL
  Filled 2024-11-07: qty 15

## 2024-11-07 MED ORDER — ACETAMINOPHEN 10 MG/ML IV SOLN: 1000.0000 mg | Freq: Once | INTRAVENOUS | Status: DC | PRN

## 2024-11-07 MED ORDER — LACTATED RINGERS IV SOLN: INTRAVENOUS | Status: DC

## 2024-11-07 MED ORDER — FENTANYL CITRATE (PF) 100 MCG/2ML IJ SOLN: 25.0000 ug | INTRAMUSCULAR | Status: DC | PRN

## 2024-11-07 MED ORDER — OXYCODONE HCL 5 MG PO TABS
5.0000 mg | ORAL_TABLET | Freq: Four times a day (QID) | ORAL | 0 refills | Status: AC | PRN
  Filled 2024-11-07: qty 25, 7d supply, fill #0

## 2024-11-07 MED ORDER — BUPIVACAINE LIPOSOME 1.3 % IJ SUSP
INTRAMUSCULAR | Status: AC
  Filled 2024-11-07: qty 20

## 2024-11-07 MED ORDER — METRONIDAZOLE 500 MG/100ML IV SOLN
500.0000 mg | INTRAVENOUS | Status: DC
  Filled 2024-11-07: qty 100

## 2024-11-07 MED ORDER — THROMBIN 20000 UNITS EX KIT
PACK | CUTANEOUS | Status: DC | PRN
  Administered 2024-11-07: 20 mL via TOPICAL

## 2024-11-07 MED ORDER — ORAL CARE MOUTH RINSE: 15.0000 mL | Freq: Once | OROMUCOSAL | Status: AC

## 2024-11-07 MED ORDER — ENSURE PRE-SURGERY PO LIQD: 296.0000 mL | Freq: Once | ORAL | Status: DC

## 2024-11-07 MED ORDER — LACTATED RINGERS IV SOLN: INTRAVENOUS | Status: DC | PRN

## 2024-11-07 MED ORDER — LIDOCAINE 2% (20 MG/ML) 5 ML SYRINGE
INTRAMUSCULAR | Status: DC | PRN
  Administered 2024-11-07: 100 mg via INTRAVENOUS

## 2024-11-07 MED ORDER — GABAPENTIN 300 MG PO CAPS
300.0000 mg | ORAL_CAPSULE | ORAL | Status: AC
  Administered 2024-11-07: 300 mg via ORAL
  Filled 2024-11-07: qty 1

## 2024-11-07 MED ORDER — PROPOFOL 10 MG/ML IV BOLUS
INTRAVENOUS | Status: AC
  Filled 2024-11-07: qty 20

## 2024-11-07 MED ORDER — CEFAZOLIN SODIUM-DEXTROSE 2-3 GM-%(50ML) IV SOLR
INTRAVENOUS | Status: DC | PRN
  Administered 2024-11-07: 2 g via INTRAVENOUS

## 2024-11-07 MED ORDER — INSULIN ASPART 100 UNIT/ML IJ SOLN: 0.0000 [IU] | INTRAMUSCULAR | Status: DC | PRN

## 2024-11-07 MED ORDER — THROMBIN 20000 UNITS EX SOLR
CUTANEOUS | Status: AC
  Filled 2024-11-07: qty 20000

## 2024-11-07 MED ORDER — OXYCODONE HCL 5 MG PO TABS
ORAL_TABLET | ORAL | Status: AC
  Filled 2024-11-07: qty 1

## 2024-11-07 MED ORDER — FENTANYL CITRATE (PF) 100 MCG/2ML IJ SOLN
INTRAMUSCULAR | Status: AC
  Filled 2024-11-07: qty 2

## 2024-11-07 MED ORDER — BUPIVACAINE-EPINEPHRINE (PF) 0.25% -1:200000 IJ SOLN
INTRAMUSCULAR | Status: AC
  Filled 2024-11-07: qty 30

## 2024-11-07 MED ORDER — EPHEDRINE SULFATE (PRESSORS) 25 MG/5ML IV SOSY
PREFILLED_SYRINGE | INTRAVENOUS | Status: DC | PRN
  Administered 2024-11-07 (×2): 10 mg via INTRAVENOUS

## 2024-11-07 MED ORDER — MIDAZOLAM HCL 5 MG/5ML IJ SOLN
INTRAMUSCULAR | Status: DC | PRN
  Administered 2024-11-07: 2 mg via INTRAVENOUS

## 2024-11-07 MED ORDER — DROPERIDOL 2.5 MG/ML IJ SOLN: 0.6250 mg | Freq: Once | INTRAMUSCULAR | Status: DC | PRN

## 2024-11-07 MED ORDER — MIDAZOLAM HCL 2 MG/2ML IJ SOLN
INTRAMUSCULAR | Status: AC
  Filled 2024-11-07: qty 2

## 2024-11-07 MED ORDER — HEMOSTATIC AGENTS (NO CHARGE) OPTIME
TOPICAL | Status: DC | PRN
  Administered 2024-11-07: 1 via TOPICAL

## 2024-11-07 MED ORDER — FENTANYL CITRATE (PF) 100 MCG/2ML IJ SOLN
INTRAMUSCULAR | Status: DC | PRN
  Administered 2024-11-07 (×2): 50 ug via INTRAVENOUS

## 2024-11-07 MED ORDER — PHENYLEPHRINE 80 MCG/ML (10ML) SYRINGE FOR IV PUSH (FOR BLOOD PRESSURE SUPPORT)
PREFILLED_SYRINGE | INTRAVENOUS | Status: DC | PRN
  Administered 2024-11-07 (×2): 80 ug via INTRAVENOUS

## 2024-11-07 SURGICAL SUPPLY — 34 items
BAG COUNTER SPONGE SURGICOUNT (BAG) ×1 IMPLANT
BLADE SURG 15 STRL LF DISP TIS (BLADE) IMPLANT
BRIEF MESH DISP LRG (UNDERPADS AND DIAPERS) IMPLANT
CANISTER SUCTION 3000ML PPV (SUCTIONS) ×1 IMPLANT
COVER MAYO STAND STRL (DRAPES) ×1 IMPLANT
COVER SURGICAL LIGHT HANDLE (MISCELLANEOUS) ×1 IMPLANT
DRAPE UTILITY XL STRL (DRAPES) ×1 IMPLANT
ELECTRODE REM PT RTRN 9FT ADLT (ELECTROSURGICAL) ×1 IMPLANT
GAUZE 4X4 16PLY ~~LOC~~+RFID DBL (SPONGE) ×1 IMPLANT
GAUZE SPONGE 4X4 12PLY STRL (GAUZE/BANDAGES/DRESSINGS) ×1 IMPLANT
GLOVE BIO SURGEON STRL SZ8 (GLOVE) ×1 IMPLANT
GLOVE BIOGEL PI IND STRL 8 (GLOVE) ×1 IMPLANT
GOWN STRL REUS W/ TWL LRG LVL3 (GOWN DISPOSABLE) ×1 IMPLANT
GOWN STRL REUS W/ TWL XL LVL3 (GOWN DISPOSABLE) ×1 IMPLANT
KIT BASIN OR (CUSTOM PROCEDURE TRAY) ×1 IMPLANT
KIT SIGMOIDOSCOPE (SET/KITS/TRAYS/PACK) IMPLANT
KIT TURNOVER KIT B (KITS) ×1 IMPLANT
NDL 22X1.5 STRL (OR ONLY) (MISCELLANEOUS) ×1 IMPLANT
PACK LITHOTOMY IV (CUSTOM PROCEDURE TRAY) ×1 IMPLANT
PAD ABD 8X10 STRL (GAUZE/BANDAGES/DRESSINGS) IMPLANT
PAD ARMBOARD POSITIONER FOAM (MISCELLANEOUS) ×2 IMPLANT
PENCIL SMOKE EVACUATOR (MISCELLANEOUS) ×1 IMPLANT
SHEARS HARMONIC 9CM CVD (BLADE) ×1 IMPLANT
SOLN 0.9% NACL POUR BTL 1000ML (IV SOLUTION) ×1 IMPLANT
SPONGE SURGIFOAM ABS GEL 100 (HEMOSTASIS) IMPLANT
SURGILUBE 2OZ TUBE FLIPTOP (MISCELLANEOUS) ×1 IMPLANT
SUT CHROMIC 2 0 SH (SUTURE) ×1 IMPLANT
SUT CHROMIC 3 0 SH 27 (SUTURE) ×1 IMPLANT
SUT VIC AB 3-0 SH 8-18 (SUTURE) IMPLANT
SYR CONTROL 10ML LL (SYRINGE) ×1 IMPLANT
TOWEL GREEN STERILE FF (TOWEL DISPOSABLE) ×2 IMPLANT
TUBE CONNECTING 12X1/4 (SUCTIONS) ×1 IMPLANT
UNDERPAD 30X36 HEAVY ABSORB (UNDERPADS AND DIAPERS) ×1 IMPLANT
YANKAUER SUCT BULB TIP NO VENT (SUCTIONS) ×1 IMPLANT

## 2024-11-07 NOTE — Anesthesia Postprocedure Evaluation (Signed)
 Anesthesia Post Note  Patient: Bonnie Dickson  Procedure(s) Performed: HEMORRHOIDECTOMY, EXTERNAL (Rectum)     Patient location during evaluation: PACU Anesthesia Type: General Level of consciousness: awake and alert Pain management: pain level controlled Vital Signs Assessment: post-procedure vital signs reviewed and stable Respiratory status: spontaneous breathing, nonlabored ventilation, respiratory function stable and patient connected to nasal cannula oxygen Cardiovascular status: blood pressure returned to baseline and stable Postop Assessment: no apparent nausea or vomiting Anesthetic complications: no   No notable events documented.  Last Vitals:  Vitals:   11/07/24 0845 11/07/24 0900  BP: 132/77 (!) 150/76  Pulse: 71 69  Resp: (!) 21 12  Temp:  36.5 C  SpO2: 92% 95%    Last Pain:  Vitals:   11/07/24 0900  TempSrc:   PainSc: Asleep                 Rome Ade

## 2024-11-07 NOTE — H&P (Signed)
 Bonnie Dickson is an 53 y.o. female.   Chief Complaint: external hemorrhoids HPI: Patient presents for hemorrhoidectomy.  Her symptoms have been similar since I saw her last in the office with a couple of flareups.  No other new issues or complaints.  Past Medical History:  Diagnosis Date   Arthritis    lower back   Blood transfusion without reported diagnosis    CAD (coronary artery disease)    a. NSTEMI 03/2021 s/p DES to LAD.   Depression    situational   Diabetes mellitus without complication (HCC)    Endometriosis    GERD (gastroesophageal reflux disease)    History of chicken pox    HSV-2 (herpes simplex virus 2) infection    Hyperglycemia 07/24/2019   Hyperlipidemia    Hypertension    Insomnia    Myocardial infarction (HCC) 03/31/2021   Snoring    Tonsillitis and adenoiditis, chronic    childhood, caused adult snoring    Past Surgical History:  Procedure Laterality Date   ABDOMINAL HYSTERECTOMY     ovaries left in place   BREAST CYST EXCISION Right    35 years ago    COMBINED ABDOMINOPLASTY AND LIPOSUCTION     CORONARY STENT INTERVENTION N/A 04/01/2021   Procedure: CORONARY STENT INTERVENTION;  Surgeon: Anner Alm ORN, MD;  Location: [facility] INVASIVE CV LAB;  Service: Cardiovascular;  Laterality: N/A;   ENDOMETRIAL ABLATION     ENDOMETRIAL BIOPSY     LAPAROSCOPIC ENDOMETRIOSIS FULGURATION     LEFT HEART CATH AND CORONARY ANGIOGRAPHY N/A 04/01/2021   Procedure: LEFT HEART CATH AND CORONARY ANGIOGRAPHY;  Surgeon: Anner Alm ORN, MD;  Location: [facility] INVASIVE CV LAB;  Service: Cardiovascular;  Laterality: N/A;   PARTIAL HYSTERECTOMY     vaginal   TUBAL LIGATION      Family History  Problem Relation Age of Onset   Hypertension Mother    Diabetes Mother        prediabetes   Hyperlipidemia Mother    Diabetes Father    Cataracts Father    Eczema Brother    Asthma Brother    Diabetes Maternal Grandmother    Hypertension Maternal Grandmother    Diabetes Maternal  Grandfather    Hypertension Maternal Grandfather    Hyperlipidemia Maternal Grandfather    Heart failure Maternal Grandfather    COPD Maternal Grandfather    ODD Son    ADD / ADHD Son    Diabetes Daughter    Hypertension Daughter    Colon cancer Neg Hx    Stomach cancer Neg Hx    Pancreatic cancer Neg Hx    Esophageal cancer Neg Hx    Social History:  reports that she has been smoking cigarettes. She started smoking about 33 years ago. She has a 15 pack-year smoking history. She has never used smokeless tobacco. She reports that she does not drink alcohol and does not use drugs.  Allergies:  Allergies  Allergen Reactions   Ambien [Zolpidem]     Sleep Walking   Atorvastatin      Other reaction(s): myalgias (muscle pain)   Ciprofloxacin Hcl Anaphylaxis   Gentamicin Anaphylaxis   Penicillins Anaphylaxis   Clindamycin  Other (See Comments)    Causes bowel problems.   Erythromycin Other (See Comments)   Ambien [Zolpidem Tartrate] Other (See Comments)    Sleep walking    Dorethia Spikes ] Other (See Comments)    Rectal bleeding    Azithromycin     Severe GI Distress  Banana Itching, Swelling and Other (See Comments)    Tongue itches and swells- breathing not impaired   Ivp Dye [Iodinated Contrast Media] Nausea And Vomiting and Other (See Comments)    Severe vomiting    Lisinopril Cough   Metformin And Related Nausea Only and Other (See Comments)    Severe GI upset   Methylprednisolone Sodium Succ     as soon as it touches my IV I vomit   Nsaids Other (See Comments)    Rectal bleeding    Pineapple Other (See Comments)    Causes a burning sensation in the mouth   Tape Other (See Comments)    Paper tape burns the skin   Codeine Rash   Flagyl  [Metronidazole ] Rash   Morphine And Codeine Rash    Medications Prior to Admission  Medication Sig Dispense Refill   amLODipine  (NORVASC ) 10 MG tablet Take 1 tablet (10 mg total) by mouth daily. 90 tablet 3   aspirin  EC  (ASPIRIN  LOW DOSE) 81 MG tablet Take 1 tablet (81 mg total) by mouth daily. Swallow whole. 90 tablet 1   carvedilol  (COREG ) 6.25 MG tablet Take 1 tablet (6.25 mg total) by mouth 2 (two) times daily with a meal. 180 tablet 3   dibucaine (NUPERCAINAL) 1 % OINT Place 1 Application rectally 2 (two) times daily as needed for up to 10 days 56 g 0   hydrOXYzine  (ATARAX ) 10 MG tablet Take 1-2 tablets (10-20 mg total) by mouth every 8 (eight) hours as needed. (Patient taking differently: Take 10-20 mg by mouth every 8 (eight) hours as needed for anxiety.) 40 tablet 2   insulin  lispro (HUMALOG  KWIKPEN) 100 UNIT/ML KwikPen Max 30 Units daily per correction scale (Patient taking differently: Inject 30 Units into the skin daily as needed (High BS).) 6 mL 0   linaclotide  (LINZESS ) 290 MCG CAPS capsule Take 1 capsule (290 mcg total) by mouth daily before breakfast. 90 capsule 3   losartan  (COZAAR ) 25 MG tablet Take 1 tablet (25 mg total) by mouth daily. 90 tablet 3   nitroGLYCERIN  (NITROSTAT ) 0.4 MG SL tablet Place 1 tablet (0.4 mg total) under the tongue every 5 (five) minutes as needed for chest pain (up to 3 doses). 25 tablet 3   rosuvastatin  (CRESTOR ) 40 MG tablet Take 1 tablet (40 mg total) by mouth daily. (Patient taking differently: Take 20 mg by mouth daily.) 90 tablet 3   tirzepatide  (MOUNJARO ) 5 MG/0.5ML Pen Inject 5 mg into the skin once a week. 6 mL 1   valACYclovir  (VALTREX ) 500 MG tablet Take 1 tablet (500 mg total) by mouth 2 (two) times daily. 180 tablet 0   Insulin  Pen Needle 32G X 4 MM MISC Use as directed with Humalog  up to 4 times a day; in the morning, at noon, in the evening, and at bedtime. 400 each 2   tiZANidine  (ZANAFLEX ) 2 MG tablet Take 0.5-2 tablets (1-4 mg total) by mouth 2 (two) times daily as needed for muscle spasms. (Patient taking differently: Take 2 mg by mouth 2 (two) times daily as needed for muscle spasms.) 40 tablet 1    Results for orders placed or performed during the  hospital encounter of 11/07/24 (from the past 48 hours)  Glucose, capillary     Status: Abnormal   Collection Time: 11/07/24  6:05 AM  Result Value Ref Range   Glucose-Capillary 179 (H) 70 - 99 mg/dL    Comment: Glucose reference range applies only to samples taken after fasting  for at least 8 hours.   No results found.  Review of Systems  Blood pressure (!) 143/86, pulse 61, temperature 97.9 F (36.6 C), temperature source Oral, resp. rate 17, height 5' 6.5 (1.689 m), weight 87.1 kg, SpO2 97%. Physical Exam Cardiovascular:     Rate and Rhythm: Normal rate and regular rhythm.  Pulmonary:     Effort: Pulmonary effort is normal.     Breath sounds: Normal breath sounds.  Abdominal:     General: Abdomen is flat.     Palpations: Abdomen is soft.     Tenderness: There is no abdominal tenderness.  Genitourinary:    Comments: See office notes Skin:    General: Skin is warm.  Neurological:     Mental Status: She is alert and oriented to person, place, and time.      Assessment/Plan Large, circumferential external hemorrhoids -for hemorrhoidectomy.  Procedure, risks, and benefits were again discussed in detail.  I answered her questions.  I discussed the expected postoperative course.  She is agreeable.  Dann FORBES Hummer, MD 11/07/2024, 6:48 AM

## 2024-11-07 NOTE — Transfer of Care (Signed)
 Immediate Anesthesia Transfer of Care Note  Patient: Bonnie Dickson  Procedure(s) Performed: HEMORRHOIDECTOMY, EXTERNAL (Rectum)  Patient Location: PACU  Anesthesia Type:General  Level of Consciousness: drowsy  Airway & Oxygen Therapy: Patient Spontanous Breathing and Patient connected to face mask oxygen  Post-op Assessment: Report given to RN and Post -op Vital signs reviewed and stable  Post vital signs: Reviewed and stable  Last Vitals:  Vitals Value Taken Time  BP 145/86 11/07/24 08:31  Temp    Pulse 72 11/07/24 08:35  Resp 22 11/07/24 08:35  SpO2 96 % 11/07/24 08:35  Vitals shown include unfiled device data.  Last Pain:  Vitals:   11/07/24 0636  TempSrc:   PainSc: 0-No pain         Complications: No notable events documented.

## 2024-11-07 NOTE — Op Note (Signed)
  11/07/2024  8:29 AM  PATIENT:  Bonnie Dickson  53 y.o. female  PRE-OPERATIVE DIAGNOSIS:  EXTERNAL HEMORRHOIDS  POST-OPERATIVE DIAGNOSIS:  EXTERNAL HEMORRHOIDS  PROCEDURE:  Procedure(s): HEMORRHOIDECTOMY, EXTERNAL  SURGEON:  Surgeon(s): Sebastian Moles, MD  ASSISTANTS: none   ANESTHESIA:   local and general  EBL:  No intake/output data recorded.  BLOOD ADMINISTERED:none  DRAINS: none   SPECIMEN:  Excision  DISPOSITION OF SPECIMEN:  PATHOLOGY  COUNTS:  YES  DICTATION: .Dragon Dictation Findings: Large circumferential external hemorrhoids without thrombosis, no significant internal hemorrhoids  Procedure detail: Informed consent was obtained.  She received intravenous antibiotics.  She was brought to the operating room and general anesthesia with laryngeal mask airway was administered by the anesthesia staff.  She was placed in lithotomy position with appropriate padding and her perianal region was prepped and draped in a sterile fashion.  We did a timeout procedure.  I then injected with a one-to-one mixture of Exparel  and local anesthetic in the perianal region for postoperative pain relief.  Examination under anesthesia then revealed no significant internal hemorrhoids and no masses.  She has large circumferential external hemorrhoids without thrombosis.  I began superiorly and incised the endoderm sharply and excised the external hemorrhoids anteriorly using the harmonic scalpel.  These hemorrhoids were totally external and we stayed well away from the sphincters.  I then removed the from the right side in a similar fashion.  I used a running 3-0 Vicryl to suture the endoderm to the mucosa after achieving excellent hemostasis using cautery.  Finally, I removed the remaining hemorrhoids posteriorly and on the right side again by incising the anoderm sharply with a scalpel and then removing them using the harmonic scalpel.  Hemostasis was ensured with cautery and I closed the  defect again by suturing the anoderm to the mucosa with running 3-0 Vicryl.  There was no constriction at all of the anal opening and there was excellent hemostasis.  I irrigated the area.  I then placed a endorectal dressing of a thrombin  soaked Gelfoam roll.  All counts were correct.  She tolerated the procedure well without apparent complication was taken recovery in stable condition. PATIENT DISPOSITION:  PACU - hemodynamically stable.   Delay start of Pharmacological VTE agent (>24hrs) due to surgical blood loss or risk of bleeding:  no  Moles Sebastian, MD, MPH, FACS Pager: [phone]  12/8/20258:29 AM

## 2024-11-07 NOTE — Anesthesia Procedure Notes (Signed)
 Procedure Name: LMA Insertion Date/Time: 11/07/2024 7:37 AM  Performed by: Deeann Eva BROCKS, CRNAPre-anesthesia Checklist: Patient identified, Emergency Drugs available, Suction available and Patient being monitored Patient Re-evaluated:Patient Re-evaluated prior to induction Oxygen Delivery Method: Circle System Utilized Preoxygenation: Pre-oxygenation with 100% oxygen Induction Type: IV induction Ventilation: Mask ventilation without difficulty LMA: LMA inserted LMA Size: 3.0 Number of attempts: 1 Placement Confirmation: positive ETCO2 Tube secured with: Tape Dental Injury: Teeth and Oropharynx as per pre-operative assessment

## 2024-11-08 ENCOUNTER — Encounter (HOSPITAL_COMMUNITY): Payer: Self-pay | Admitting: General Surgery

## 2024-11-08 LAB — SURGICAL PATHOLOGY

## 2024-12-08 ENCOUNTER — Ambulatory Visit: Admitting: Family Medicine

## 2025-01-05 ENCOUNTER — Other Ambulatory Visit: Payer: Self-pay | Admitting: Family Medicine

## 2025-01-05 ENCOUNTER — Other Ambulatory Visit: Payer: Self-pay

## 2025-01-05 ENCOUNTER — Other Ambulatory Visit: Payer: Self-pay | Admitting: Cardiology

## 2025-01-05 ENCOUNTER — Other Ambulatory Visit: Payer: Self-pay | Admitting: Family

## 2025-01-05 DIAGNOSIS — I2510 Atherosclerotic heart disease of native coronary artery without angina pectoris: Secondary | ICD-10-CM

## 2025-01-05 DIAGNOSIS — E785 Hyperlipidemia, unspecified: Secondary | ICD-10-CM

## 2025-01-05 DIAGNOSIS — I10 Essential (primary) hypertension: Secondary | ICD-10-CM

## 2025-01-06 ENCOUNTER — Other Ambulatory Visit: Payer: Self-pay

## 2025-01-06 ENCOUNTER — Other Ambulatory Visit (HOSPITAL_BASED_OUTPATIENT_CLINIC_OR_DEPARTMENT_OTHER): Payer: Self-pay

## 2025-01-06 ENCOUNTER — Other Ambulatory Visit (HOSPITAL_COMMUNITY): Payer: Self-pay

## 2025-01-06 MED ORDER — ROSUVASTATIN CALCIUM 20 MG PO TABS
20.0000 mg | ORAL_TABLET | Freq: Every day | ORAL | 1 refills | Status: AC
  Filled 2025-01-06: qty 90, 90d supply, fill #0

## 2025-01-06 MED ORDER — AMLODIPINE BESYLATE 10 MG PO TABS
10.0000 mg | ORAL_TABLET | Freq: Every day | ORAL | 1 refills | Status: AC
  Filled 2025-01-06: qty 90, 90d supply, fill #0

## 2025-01-06 MED ORDER — HYDROXYZINE HCL 10 MG PO TABS
10.0000 mg | ORAL_TABLET | Freq: Three times a day (TID) | ORAL | 2 refills | Status: AC | PRN
  Filled 2025-01-06: qty 40, 7d supply, fill #0

## 2025-01-06 MED ORDER — CARVEDILOL 6.25 MG PO TABS
6.2500 mg | ORAL_TABLET | Freq: Two times a day (BID) | ORAL | 1 refills | Status: AC
  Filled 2025-01-06: qty 180, 90d supply, fill #0

## 2025-01-06 MED ORDER — MOUNJARO 5 MG/0.5ML ~~LOC~~ SOAJ
5.0000 mg | SUBCUTANEOUS | 1 refills | Status: AC
  Filled 2025-01-06: qty 6, 84d supply, fill #0

## 2025-07-03 ENCOUNTER — Encounter: Admitting: Family Medicine
